# Patient Record
Sex: Male | Born: 1955 | Race: White | Hispanic: No | Marital: Married | Smoking: Never smoker
Health system: Southern US, Community
[De-identification: ages and names within clinical notes are randomized; demographics above are authoritative.]

## PROBLEM LIST (undated history)

## (undated) DIAGNOSIS — R7689 Other specified abnormal immunological findings in serum: Secondary | ICD-10-CM

## (undated) DIAGNOSIS — E041 Nontoxic single thyroid nodule: Secondary | ICD-10-CM

## (undated) DIAGNOSIS — E785 Hyperlipidemia, unspecified: Secondary | ICD-10-CM

## (undated) DIAGNOSIS — I209 Angina pectoris, unspecified: Secondary | ICD-10-CM

## (undated) DIAGNOSIS — R7303 Prediabetes: Secondary | ICD-10-CM

## (undated) DIAGNOSIS — R911 Solitary pulmonary nodule: Secondary | ICD-10-CM

## (undated) DIAGNOSIS — M1990 Unspecified osteoarthritis, unspecified site: Secondary | ICD-10-CM

## (undated) DIAGNOSIS — N400 Enlarged prostate without lower urinary tract symptoms: Secondary | ICD-10-CM

## (undated) DIAGNOSIS — I10 Essential (primary) hypertension: Secondary | ICD-10-CM

## (undated) DIAGNOSIS — K7689 Other specified diseases of liver: Secondary | ICD-10-CM

## (undated) DIAGNOSIS — D352 Benign neoplasm of pituitary gland: Secondary | ICD-10-CM

## (undated) DIAGNOSIS — Z7902 Long term (current) use of antithrombotics/antiplatelets: Secondary | ICD-10-CM

## (undated) DIAGNOSIS — M75101 Unspecified rotator cuff tear or rupture of right shoulder, not specified as traumatic: Secondary | ICD-10-CM

## (undated) DIAGNOSIS — R768 Other specified abnormal immunological findings in serum: Secondary | ICD-10-CM

## (undated) DIAGNOSIS — T7840XA Allergy, unspecified, initial encounter: Secondary | ICD-10-CM

## (undated) HISTORY — DX: Hyperlipidemia, unspecified: E78.5

## (undated) HISTORY — PX: TONSILLECTOMY: SUR1361

## (undated) HISTORY — DX: Essential (primary) hypertension: I10

## (undated) HISTORY — DX: Unspecified osteoarthritis, unspecified site: M19.90

## (undated) HISTORY — PX: CYSTOSCOPY WITH INSERTION OF UROLIFT: SHX6678

## (undated) HISTORY — PX: CATARACT EXTRACTION: SUR2

## (undated) HISTORY — PX: PITUITARY EXCISION: SHX745

## (undated) HISTORY — DX: Allergy, unspecified, initial encounter: T78.40XA

## (undated) HISTORY — PX: EYE SURGERY: SHX253

---

## 2006-06-17 HISTORY — PX: KNEE ARTHROSCOPY: SUR90

## 2013-05-12 LAB — HM COLONOSCOPY

## 2018-07-22 LAB — PSA: PSA: 1.1

## 2018-07-22 LAB — CBC AND DIFFERENTIAL
HCT: 43 (ref 41–53)
Hemoglobin: 14.3 (ref 13.5–17.5)
Neutrophils Absolute: 5
Platelets: 275 (ref 150–399)
WBC: 8.6

## 2018-07-22 LAB — BASIC METABOLIC PANEL
BUN: 16 (ref 4–21)
Creatinine: 0.9 (ref 0.6–1.3)
Glucose: 84
Potassium: 3.9 (ref 3.4–5.3)

## 2018-07-22 LAB — HEPATIC FUNCTION PANEL
ALT: 18 (ref 10–40)
AST: 28 (ref 14–40)
Alkaline Phosphatase: 90 (ref 25–125)
Bilirubin, Total: 0.6

## 2018-07-22 LAB — LIPID PANEL
Cholesterol: 183 (ref 0–200)
HDL: 43 (ref 35–70)
LDL Cholesterol: 108

## 2019-02-24 ENCOUNTER — Encounter: Payer: Self-pay | Admitting: Physician Assistant

## 2019-02-24 ENCOUNTER — Other Ambulatory Visit: Payer: Self-pay

## 2019-02-24 ENCOUNTER — Ambulatory Visit (INDEPENDENT_AMBULATORY_CARE_PROVIDER_SITE_OTHER): Payer: BC Managed Care – PPO | Admitting: Physician Assistant

## 2019-02-24 VITALS — BP 153/89 | HR 83 | Temp 97.5°F | Resp 16 | Ht 67.0 in | Wt 169.0 lb

## 2019-02-24 DIAGNOSIS — N401 Enlarged prostate with lower urinary tract symptoms: Secondary | ICD-10-CM

## 2019-02-24 DIAGNOSIS — N50819 Testicular pain, unspecified: Secondary | ICD-10-CM | POA: Diagnosis not present

## 2019-02-24 DIAGNOSIS — E785 Hyperlipidemia, unspecified: Secondary | ICD-10-CM | POA: Diagnosis not present

## 2019-02-24 DIAGNOSIS — Z800 Family history of malignant neoplasm of digestive organs: Secondary | ICD-10-CM

## 2019-02-24 DIAGNOSIS — Z1211 Encounter for screening for malignant neoplasm of colon: Secondary | ICD-10-CM

## 2019-02-24 DIAGNOSIS — Z23 Encounter for immunization: Secondary | ICD-10-CM | POA: Diagnosis not present

## 2019-02-24 DIAGNOSIS — M1990 Unspecified osteoarthritis, unspecified site: Secondary | ICD-10-CM

## 2019-02-24 DIAGNOSIS — R3911 Hesitancy of micturition: Secondary | ICD-10-CM

## 2019-02-24 DIAGNOSIS — I10 Essential (primary) hypertension: Secondary | ICD-10-CM

## 2019-02-24 DIAGNOSIS — R3912 Poor urinary stream: Secondary | ICD-10-CM

## 2019-02-24 LAB — POCT URINALYSIS DIPSTICK
Bilirubin, UA: NEGATIVE
Blood, UA: NEGATIVE
Glucose, UA: NEGATIVE
Ketones, UA: NEGATIVE
Leukocytes, UA: NEGATIVE
Nitrite, UA: NEGATIVE
Protein, UA: POSITIVE — AB
Spec Grav, UA: 1.02 (ref 1.010–1.025)
Urobilinogen, UA: 0.2 E.U./dL
pH, UA: 6.5 (ref 5.0–8.0)

## 2019-02-24 MED ORDER — NAPROXEN 500 MG PO TABS: 500.0000 mg | ORAL_TABLET | Freq: Every day | ORAL | 1 refills | Status: DC | PRN

## 2019-02-24 MED ORDER — TAMSULOSIN HCL 0.4 MG PO CAPS: 0.4000 mg | ORAL_CAPSULE | Freq: Every day | ORAL | 1 refills | Status: DC

## 2019-02-24 NOTE — Progress Notes (Signed)
Patient: Brett Wilson Male    DOB: 02-Mar-1956   63 y.o.   MRN: [id number] Visit Date: 02/25/2019  Today's Provider: Trinna Post, [name]   Chief Complaint  Patient presents with  . Establish Care   Subjective:     HPI   Patient presents today to establish care. 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 He has one child aged 33 and no grand children.   HTN: He is currently being treated for HTN with losartan 100 mg daily and HCTZ 12.5 mg daily. He denies chest pain, SOB. He reports his blood pressure runs high in the office but at home when he checks it was most recently 136/75.  BPH: He is being treated for BPH with flomax 0.4 mg daily. His symptoms included weak urinary stream.   HLD: Currently taking simvastatin 40 mg QD.   Testicular Pain: He has a history of intermittent testicular pain over the course of several years. At one point he underwent an ultrasound which revealed a hydrocele but was otherwise normal. He reports he will sometimes have a dull ache and intermittent twinges of pain. He has been treated at times with bactrim DS BID x 10 days, most recently prescribed by his PCP in [place] several weeks ago. He is uncertain if this helped.   Arthritis: He well occasionally take naproxen for aches and pains.   Allergies  Allergen Reactions  . Penicillins Hives     Current Outpatient Medications:  .  hydrochlorothiazide (MICROZIDE) 12.5 MG capsule, Take 12.5 mg by mouth daily. , Disp: , Rfl:  .  losartan (COZAAR) 100 MG tablet, Take 100 mg by mouth daily. , Disp: , Rfl:  .  Multiple Vitamin (MULTIVITAMIN PO), Take by mouth daily., Disp: , Rfl:  .  Multiple Vitamins-Minerals (ZINC PO), Take by mouth daily., Disp: , Rfl:  .  simvastatin (ZOCOR) 40 MG tablet, Take 40 mg by mouth daily. , Disp: , Rfl:  .  tamsulosin (FLOMAX) 0.4 MG CAPS capsule, Take 1 capsule (0.4 mg total) by mouth daily., Disp: 90  capsule, Rfl: 1 .  naproxen (NAPROSYN) 500 MG tablet, Take 1 tablet (500 mg total) by mouth daily as needed., Disp: 60 tablet, Rfl: 1  Review of Systems  HENT: Positive for tinnitus.   Genitourinary: Positive for difficulty urinating and testicular pain.  Musculoskeletal: Positive for back pain and neck pain.  All other systems reviewed and are negative.   Social History   Tobacco Use  . Smoking status: Never Smoker  . Smokeless tobacco: Never Used  Substance Use Topics  . Alcohol use: Yes    Frequency: Never      Objective:   BP (!) 153/89 (BP Location: Right Arm, Patient Position: Sitting, Cuff Size: Large)   Pulse 83   Temp (!) 97.5 F (36.4 C) (Other (Comment))   Resp 16   Ht 5\' 7"  (1.702 m)   Wt 169 lb (76.7 kg)   SpO2 95%   BMI 26.47 kg/m  Vitals:   02/24/19 0914  BP: (!) 153/89  Pulse: 83  Resp: 16  Temp: (!) 97.5 F (36.4 C)  TempSrc: Other (Comment)  SpO2: 95%  Weight: 169 lb (76.7 kg)  Height: 5\' 7"  (1.702 m)  Body mass index is 26.47 kg/m.   Physical Exam Constitutional:      Appearance: Normal appearance.  Cardiovascular:     Rate and Rhythm:  Normal rate and regular rhythm.     Heart sounds: Normal heart sounds.  Pulmonary:     Effort: Pulmonary effort is normal.     Breath sounds: Normal breath sounds.  Abdominal:     General: Bowel sounds are normal.     Palpations: Abdomen is soft.  Skin:    General: Skin is warm and dry.  Neurological:     Mental Status: He is alert and oriented to person, place, and time. Mental status is at baseline.  Psychiatric:        Behavior: Behavior normal.        Thought Content: Thought content normal.      Results for orders placed or performed in visit on 02/24/19  POCT Urinalysis Dipstick  Result Value Ref Range   Color, UA     Clarity, UA     Glucose, UA Negative Negative   Bilirubin, UA Negative    Ketones, UA Negative    Spec Grav, UA 1.020 1.010 - 1.025   Blood, UA Negative    pH, UA 6.5  5.0 - 8.0   Protein, UA Positive (A) Negative   Urobilinogen, UA 0.2 0.2 or 1.0 E.U./dL   Nitrite, UA Negative    Leukocytes, UA Negative Negative   Appearance     Odor         Assessment & Plan    1. Hyperlipidemia, unspecified hyperlipidemia type  Continue statin. Well controlled based on labs from previous PCP.   2. Essential hypertension  Reports his readings are normal at home. Continue current medications.  3. Benign prostatic hyperplasia with weak urinary stream  Continue flomax.   - tamsulosin (FLOMAX) 0.4 MG CAPS capsule; Take 1 capsule (0.4 mg total) by mouth daily.  Dispense: 90 capsule; Refill: 1  4. Arthritis  - naproxen (NAPROSYN) 500 MG tablet; Take 1 tablet (500 mg total) by mouth daily as needed.  Dispense: 60 tablet; Refill: 1  5. Testicular pain  - Ambulatory referral to Urology - POCT Urinalysis Dipstick - CULTURE, URINE COMPREHENSIVE  6. Family history of colon cancer  - Ambulatory referral to Gastroenterology  7. Need for influenza vaccination  - Flu Vaccine QUAD 36+ mos IM  8. Benign prostatic hyperplasia with urinary hesitancy  - Ambulatory referral to Urology  The entirety of the information documented in the History of Present Illness, Review of Systems and Physical Exam were personally obtained by me. Portions of this information were initially documented by April M. Sabra Heck, CMA and reviewed by me for thoroughness and accuracy.   F/u 6 months for CPE     Trinna Post, [name]  [facility]

## 2019-02-24 NOTE — Patient Instructions (Signed)
Health Maintenance After Age 63 After age 63, you are at a higher risk for certain long-term diseases and infections as well as injuries from falls. Falls are a major cause of broken bones and head injuries in people who are older than age 63. Getting regular preventive care can help to keep you healthy and well. Preventive care includes getting regular testing and making lifestyle changes as recommended by your health care provider. Talk with your health care provider about:  Which screenings and tests you should have. A screening is a test that checks for a disease when you have no symptoms.  A diet and exercise plan that is right for you. What should I know about screenings and tests to prevent falls? Screening and testing are the best ways to find a health problem early. Early diagnosis and treatment give you the best chance of managing medical conditions that are common after age 63. Certain conditions and lifestyle choices may make you more likely to have a fall. Your health care provider may recommend:  Regular vision checks. Poor vision and conditions such as cataracts can make you more likely to have a fall. If you wear glasses, make sure to get your prescription updated if your vision changes.  Medicine review. Work with your health care provider to regularly review all of the medicines you are taking, including over-the-counter medicines. Ask your health care provider about any side effects that may make you more likely to have a fall. Tell your health care provider if any medicines that you take make you feel dizzy or sleepy.  Osteoporosis screening. Osteoporosis is a condition that causes the bones to get weaker. This can make the bones weak and cause them to break more easily.  Blood pressure screening. Blood pressure changes and medicines to control blood pressure can make you feel dizzy.  Strength and balance checks. Your health care provider may recommend certain tests to check your  strength and balance while standing, walking, or changing positions.  Foot health exam. Foot pain and numbness, as well as not wearing proper footwear, can make you more likely to have a fall.  Depression screening. You may be more likely to have a fall if you have a fear of falling, feel emotionally low, or feel unable to do activities that you used to do.  Alcohol use screening. Using too much alcohol can affect your balance and may make you more likely to have a fall. What actions can I take to lower my risk of falls? General instructions  Talk with your health care provider about your risks for falling. Tell your health care provider if: ? You fall. Be sure to tell your health care provider about all falls, even ones that seem minor. ? You feel dizzy, sleepy, or off-balance.  Take over-the-counter and prescription medicines only as told by your health care provider. These include any supplements.  Eat a healthy diet and maintain a healthy weight. A healthy diet includes low-fat dairy products, low-fat (lean) meats, and fiber from whole grains, beans, and lots of fruits and vegetables. Home safety  Remove any tripping hazards, such as rugs, cords, and clutter.  Install safety equipment such as grab bars in bathrooms and safety rails on stairs.  Keep rooms and walkways well-lit. Activity   Follow a regular exercise program to stay fit. This will help you maintain your balance. Ask your health care provider what types of exercise are appropriate for you.  If you need a cane or   walker, use it as recommended by your health care provider.  Wear supportive shoes that have nonskid soles. Lifestyle  Do not drink alcohol if your health care provider tells you not to drink.  If you drink alcohol, limit how much you have: ? 0-1 drink a day for women. ? 0-2 drinks a day for men.  Be aware of how much alcohol is in your drink. In the [place] one drink equals one typical bottle of beer (12  oz), one-half glass of wine (5 oz), or one shot of hard liquor (1 oz).  Do not use any products that contain nicotine or tobacco, such as cigarettes and e-cigarettes. If you need help quitting, ask your health care provider. Summary  Having a healthy lifestyle and getting preventive care can help to protect your health and wellness after age 63.  Screening and testing are the best way to find a health problem early and help you avoid having a fall. Early diagnosis and treatment give you the best chance for managing medical conditions that are more common for people who are older than age 63.  Falls are a major cause of broken bones and head injuries in people who are older than age 63. Take precautions to prevent a fall at home.  Work with your health care provider to learn what changes you can make to improve your health and wellness and to prevent falls. This information is not intended to replace advice given to you by your health care provider. Make sure you discuss any questions you have with your health care provider. Document Released: 04/16/2017 Document Revised: 09/24/2018 Document Reviewed: 04/16/2017 Elsevier Patient Education  2020 Elsevier Inc.  

## 2019-02-25 ENCOUNTER — Encounter: Payer: Self-pay | Admitting: Physician Assistant

## 2019-02-25 DIAGNOSIS — N50819 Testicular pain, unspecified: Secondary | ICD-10-CM | POA: Insufficient documentation

## 2019-02-25 DIAGNOSIS — E785 Hyperlipidemia, unspecified: Secondary | ICD-10-CM | POA: Insufficient documentation

## 2019-02-25 DIAGNOSIS — I10 Essential (primary) hypertension: Secondary | ICD-10-CM | POA: Insufficient documentation

## 2019-02-25 DIAGNOSIS — N401 Enlarged prostate with lower urinary tract symptoms: Secondary | ICD-10-CM | POA: Insufficient documentation

## 2019-02-25 DIAGNOSIS — R3912 Poor urinary stream: Secondary | ICD-10-CM | POA: Insufficient documentation

## 2019-02-25 DIAGNOSIS — M1990 Unspecified osteoarthritis, unspecified site: Secondary | ICD-10-CM | POA: Insufficient documentation

## 2019-02-27 LAB — CULTURE, URINE COMPREHENSIVE

## 2019-03-01 ENCOUNTER — Encounter: Payer: Self-pay | Admitting: Physician Assistant

## 2019-03-24 ENCOUNTER — Ambulatory Visit: Payer: BC Managed Care – PPO | Admitting: Urology

## 2019-03-24 ENCOUNTER — Encounter: Payer: Self-pay | Admitting: Urology

## 2019-03-24 ENCOUNTER — Other Ambulatory Visit: Payer: Self-pay

## 2019-03-24 VITALS — BP 183/82 | HR 99 | Ht 67.0 in | Wt 185.0 lb

## 2019-03-24 DIAGNOSIS — N401 Enlarged prostate with lower urinary tract symptoms: Secondary | ICD-10-CM | POA: Diagnosis not present

## 2019-03-24 DIAGNOSIS — N50819 Testicular pain, unspecified: Secondary | ICD-10-CM

## 2019-03-24 DIAGNOSIS — R339 Retention of urine, unspecified: Secondary | ICD-10-CM

## 2019-03-24 DIAGNOSIS — N138 Other obstructive and reflux uropathy: Secondary | ICD-10-CM | POA: Diagnosis not present

## 2019-03-24 DIAGNOSIS — G8929 Other chronic pain: Secondary | ICD-10-CM

## 2019-03-24 LAB — MICROSCOPIC EXAMINATION
Bacteria, UA: NONE SEEN
Epithelial Cells (non renal): NONE SEEN /hpf (ref 0–10)
RBC, Urine: NONE SEEN /hpf (ref 0–2)

## 2019-03-24 LAB — URINALYSIS, COMPLETE
Bilirubin, UA: NEGATIVE
Glucose, UA: NEGATIVE
Ketones, UA: NEGATIVE
Leukocytes,UA: NEGATIVE
Nitrite, UA: NEGATIVE
Protein,UA: NEGATIVE
RBC, UA: NEGATIVE
Specific Gravity, UA: >1.03 — ABNORMAL HIGH (ref 1.005–1.030)
Urobilinogen, Ur: 0.2 mg/dL (ref 0.2–1.0)
pH, UA: 6 (ref 5.0–7.5)

## 2019-03-24 LAB — BLADDER SCAN AMB NON-IMAGING

## 2019-03-24 MED ORDER — FINASTERIDE 5 MG PO TABS: 5.0000 mg | ORAL_TABLET | Freq: Every day | ORAL | 11 refills | Status: DC

## 2019-03-24 NOTE — Progress Notes (Signed)
03/24/2019 10:48 AM   Brett Wilson 1955/08/11 [id number]  Referring provider: Trinna Post, [name] [address] [place],  [place] [id number]  Chief Complaint  Patient presents with  . Testicle Pain    HPI: 63 year old male who presents today to establish care for history of right testicular pain as well as urinary symptoms.  He has recently moved from [place] to [place] and is is seeking to establish care with a urologist.  He has a personal history of right testicular pain and possibly recurrent right epididymitis.  He reports this started about 3 years ago at which time he had an episode of severe right testicular pain and was treated with Bactrim.  After starting the antibiotics, symptoms quickly resolved.  He does subsequent episode the following year with a similar presentation and resolution.  He had another episode a few months ago prior to moving to [place] was also given antibiotics.  This time however, his pain failed to resolve immediately as on previous occasions and required about a month until all pain and swelling had resolved.  He reports that he had no associated urinary symptoms from his baseline with these episodes.  They are not associated with trauma.  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  He noticed a "lump" on his penis a few years ago.  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  He never underwent treatment.  He has very minimal penile curvature and no difficulties with penetration.  No ED.  In addition to the above, he reports that over the past several years, he has noticed a decline in his overall ability to empty his bladder, stream, with increased frequency and urgency.  IPSS as below.  He was started on Flomax a few years ago which helped somewhat but his symptoms seem to be worsening.  No dysuria or gross hematuria.  No personal history of UTIs.  Most  recent PSA 1.10 on 07/22/18.  PVR 114.  IPSS    Row Name 03/24/19 1500         International Prostate Symptom Score   How often have you had the sensation of not emptying your bladder?  More than half the time     How often have you had to urinate less than every two hours?  Less than half the time     How often have you found you stopped and started again several times when you urinated?  About half the time     How often have you found it difficult to postpone urination?  Not at All     How often have you had a weak urinary stream?  About half the time     How often have you had to strain to start urination?  Less than half the time     How many times did you typically get up at night to urinate?  2 Times     Total IPSS Score  16       Quality of Life due to urinary symptoms   If you were to spend the rest of your life with your urinary condition just the way it is now how would you feel about that?  Mostly Satisfied        Score:  1-7 Mild 8-19 Moderate 20-35 Severe    PMH: Past Medical History:  Diagnosis Date  . Allergy   . Hyperlipidemia   .  Hypertension     Surgical History: Past Surgical History:  Procedure Laterality Date  . CATARACT EXTRACTION    . PITUITARY EXCISION    . TONSILLECTOMY      Home Medications:  Allergies as of 03/24/2019      Reactions   Penicillins Hives      Medication List       Accurate as of March 24, 2019 11:59 PM. If you have any questions, ask your nurse or doctor.        finasteride 5 MG tablet Commonly known as: PROSCAR Take 1 tablet (5 mg total) by mouth daily. Started by: Hollice Espy, MD   hydrochlorothiazide 12.5 MG capsule Commonly known as: MICROZIDE Take 12.5 mg by mouth daily.   losartan 100 MG tablet Commonly known as: COZAAR Take 100 mg by mouth daily.   MULTIVITAMIN PO Take by mouth daily.   naproxen 500 MG tablet Commonly known as: Naprosyn Take 1 tablet (500 mg total) by mouth daily as  needed.   simvastatin 40 MG tablet Commonly known as: ZOCOR Take 40 mg by mouth daily.   tamsulosin 0.4 MG Caps capsule Commonly known as: FLOMAX Take 1 capsule (0.4 mg total) by mouth daily.   ZINC PO Take by mouth daily.       Allergies:  Allergies  Allergen Reactions  . Penicillins Hives    Family History: Family History  Problem Relation Age of Onset  . Hypertension Mother   . Colon cancer Father     Social History:  reports that he has never smoked. He has never used smokeless tobacco. He reports current alcohol use. He reports that he does not use drugs.  ROS: UROLOGY Frequent Urination?: No Hard to postpone urination?: No Burning/pain with urination?: No Get up at night to urinate?: Yes Leakage of urine?: No Urine stream starts and stops?: No Trouble starting stream?: Yes Do you have to strain to urinate?: No Blood in urine?: No Urinary tract infection?: No Sexually transmitted disease?: No Injury to kidneys or bladder?: No Painful intercourse?: No Weak stream?: Yes Erection problems?: No Penile pain?: No  Gastrointestinal Nausea?: No Vomiting?: No Indigestion/heartburn?: No Diarrhea?: No Constipation?: No  Constitutional Fever: No Night sweats?: No Weight loss?: No Fatigue?: No  Skin Skin rash/lesions?: No Itching?: No  Eyes Blurred vision?: No Double vision?: No  Ears/Nose/Throat Sore throat?: No Sinus problems?: No  Hematologic/Lymphatic Swollen glands?: No Easy bruising?: No  Cardiovascular Leg swelling?: No Chest pain?: No  Respiratory Cough?: No Shortness of breath?: No  Endocrine Excessive thirst?: No  Musculoskeletal Back pain?: No Joint pain?: No  Neurological Headaches?: No Dizziness?: No  Psychologic Depression?: No Anxiety?: No  Physical Exam: BP (!) 183/82   Pulse 99   Ht 5\' 7"  (1.702 m)   Wt 185 lb (83.9 kg)   BMI 28.98 kg/m   Constitutional:  Alert and oriented, No acute distress.  HEENT: [place] AT, moist mucus membranes.  Trachea midline, no masses. Cardiovascular: No clubbing, cyanosis, or edema. Respiratory: Normal respiratory effort, no increased work of breathing. GI: Abdomen is soft, nontender, nondistended, no abdominal masses GU: Normal phallus with orthotopic meatus.  Bilateral descended testicles, nontender, no masses or nodules.  Normal spermatic cords bilaterally. Rectal: Normal sphincter tone.  Enlarged prostate, 50 cc with rubbery lateral lobes, no nodules or tenderness. Skin: No rashes, bruises or suspicious lesions. Neurologic: Grossly intact, no focal deficits, moving all 4 extremities. Psychiatric: Normal mood and affect.  Laboratory Data: Lab Results  Component Value  Date   WBC 8.6 07/22/2018   HGB 14.3 07/22/2018   HCT 43 07/22/2018   PLT 275 07/22/2018    Lab Results  Component Value Date   CREATININE 0.9 07/22/2018    Lab Results  Component Value Date   PSA 1.10 07/22/2018    Urinalysis Pertinent Imaging: Results for orders placed or performed in visit on 03/24/19  Microscopic Examination   URINE  Result Value Ref Range   WBC, UA 0-5 0 - 5 /hpf   RBC None seen 0 - 2 /hpf   Epithelial Cells (non renal) None seen 0 - 10 /hpf   Bacteria, UA None seen None seen/Few  Urinalysis, Complete  Result Value Ref Range   Specific Gravity, UA >1.030 (H) 1.005 - 1.030   pH, UA 6.0 5.0 - 7.5   Color, UA Yellow Yellow   Appearance Ur Clear Clear   Leukocytes,UA Negative Negative   Protein,UA Negative Negative/Trace   Glucose, UA Negative Negative   Ketones, UA Negative Negative   RBC, UA Negative Negative   Bilirubin, UA Negative Negative   Urobilinogen, Ur 0.2 0.2 - 1.0 mg/dL   Nitrite, UA Negative Negative   Microscopic Examination See below:   Bladder Scan (Post Void Residual) in office  Result Value Ref Range   Scan Result 114ML     Assessment & Plan:    1. BPH with urinary obstruction Personal history of mixed urinary  symptoms, primarily obstructive  We do lengthy discussion today about maximizing pharmacotherapy versus consideration of procedural/surgical intervention to relieve his outlet obstruction is extruding factor to his symptoms.  In addition to the above, he does have a mildly elevated PVR which may or may not be contributing to his episodes of presumed epididymitis in the past.  Patient strongly prefers pharmacotherapy to intervention.  If he did consider intervention, he would be more interested in UroLift type procedure rather than a TURP or holep.  He was given information about this today which was discussed.  We discussed risk and benefits of each of the options.  We discussed the role of finasteride today and reduction in size of the prostate.  We discussed possible side effects including black box warning as well as libido.  He is interested in trying medication.  He understands that this medication does not work immediately.  We will follow-up in 6 months to reassess his symptoms.  Continue Flomax. - Urinalysis, Complete - Bladder Scan (Post Void Residual) in office  2. Incomplete bladder emptying As above  3. 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  Physical exam today is unremarkable.  I recommended supportive care primarily unless her significant pain or swelling.   Return in about 6 months (around 09/22/2019) for MD follow up IPSS / PVR.  Hollice Espy, MD  [facility] [address], [place], [place] [address] [phone]

## 2019-03-25 ENCOUNTER — Encounter: Payer: Self-pay | Admitting: Urology

## 2019-03-31 ENCOUNTER — Ambulatory Visit: Payer: Self-pay | Admitting: Urology

## 2019-04-05 ENCOUNTER — Telehealth: Payer: Self-pay | Admitting: Physician Assistant

## 2019-04-05 MED ORDER — HYDROCHLOROTHIAZIDE 12.5 MG PO CAPS: 12.5000 mg | ORAL_CAPSULE | Freq: Every day | ORAL | 0 refills | Status: DC

## 2019-04-05 NOTE — Telephone Encounter (Signed)
Pt needs a refill Hydrochlorothiazide   [facility]

## 2019-04-09 ENCOUNTER — Other Ambulatory Visit
Admission: RE | Admit: 2019-04-09 | Discharge: 2019-04-09 | Disposition: A | Discharge status: Home / Self Care | Payer: BC Managed Care – PPO | Source: Ambulatory Visit | Attending: Gastroenterology | Admitting: Gastroenterology

## 2019-04-09 DIAGNOSIS — Z01812 Encounter for preprocedural laboratory examination: Secondary | ICD-10-CM | POA: Insufficient documentation

## 2019-04-09 DIAGNOSIS — Z20828 Contact with and (suspected) exposure to other viral communicable diseases: Secondary | ICD-10-CM | POA: Insufficient documentation

## 2019-04-09 LAB — SARS CORONAVIRUS 2 (TAT 6-24 HRS): SARS Coronavirus 2: NEGATIVE

## 2019-04-13 ENCOUNTER — Ambulatory Visit
Admission: RE | Admit: 2019-04-13 | Discharge: 2019-04-13 | Disposition: A | Discharge status: Home / Self Care | Payer: BC Managed Care – PPO | Attending: Gastroenterology | Admitting: Gastroenterology

## 2019-04-13 ENCOUNTER — Other Ambulatory Visit: Payer: Self-pay

## 2019-04-13 ENCOUNTER — Ambulatory Visit: Payer: BC Managed Care – PPO | Admitting: Anesthesiology

## 2019-04-13 ENCOUNTER — Encounter
Admission: RE | Disposition: A | Discharge status: Home / Self Care | Payer: Self-pay | Source: Home / Self Care | Attending: Gastroenterology

## 2019-04-13 DIAGNOSIS — Z1211 Encounter for screening for malignant neoplasm of colon: Secondary | ICD-10-CM | POA: Diagnosis not present

## 2019-04-13 DIAGNOSIS — K635 Polyp of colon: Secondary | ICD-10-CM | POA: Insufficient documentation

## 2019-04-13 DIAGNOSIS — E785 Hyperlipidemia, unspecified: Secondary | ICD-10-CM | POA: Insufficient documentation

## 2019-04-13 DIAGNOSIS — Z8371 Family history of colonic polyps: Secondary | ICD-10-CM | POA: Diagnosis not present

## 2019-04-13 DIAGNOSIS — Z800 Family history of malignant neoplasm of digestive organs: Secondary | ICD-10-CM | POA: Diagnosis not present

## 2019-04-13 DIAGNOSIS — Z79899 Other long term (current) drug therapy: Secondary | ICD-10-CM | POA: Insufficient documentation

## 2019-04-13 DIAGNOSIS — I10 Essential (primary) hypertension: Secondary | ICD-10-CM | POA: Diagnosis not present

## 2019-04-13 DIAGNOSIS — K641 Second degree hemorrhoids: Secondary | ICD-10-CM | POA: Diagnosis not present

## 2019-04-13 HISTORY — PX: COLONOSCOPY WITH PROPOFOL: SHX5780

## 2019-04-13 SURGERY — COLONOSCOPY WITH PROPOFOL
Anesthesia: General

## 2019-04-13 MED ORDER — SODIUM CHLORIDE 0.9 % IV SOLN
INTRAVENOUS | Status: DC
  Administered 2019-04-13: 09:00:00 via INTRAVENOUS

## 2019-04-13 MED ORDER — LIDOCAINE HCL (CARDIAC) PF 100 MG/5ML IV SOSY
PREFILLED_SYRINGE | INTRAVENOUS | Status: DC | PRN
  Administered 2019-04-13: 100 mg via INTRAVENOUS

## 2019-04-13 MED ORDER — PROPOFOL 10 MG/ML IV BOLUS
INTRAVENOUS | Status: DC | PRN
  Administered 2019-04-13: 30 mg via INTRAVENOUS
  Administered 2019-04-13: 70 mg via INTRAVENOUS
  Administered 2019-04-13: 30 mg via INTRAVENOUS
  Administered 2019-04-13: 50 mg via INTRAVENOUS

## 2019-04-13 MED ORDER — PROPOFOL 500 MG/50ML IV EMUL
INTRAVENOUS | Status: DC | PRN
  Administered 2019-04-13: 140 ug/kg/min via INTRAVENOUS

## 2019-04-13 NOTE — Transfer of Care (Signed)
Immediate Anesthesia Transfer of Care Note  Patient: Brett Wilson  Procedure(s) Performed: COLONOSCOPY WITH PROPOFOL (N/A )  Patient Location: PACU  Anesthesia Type:General  Level of Consciousness: sedated  Airway & Oxygen Therapy: Patient Spontanous Breathing and Patient connected to nasal cannula oxygen  Post-op Assessment: Report given to RN and Post -op Vital signs reviewed and stable  Post vital signs: Reviewed and stable  Last Vitals:  Vitals Value Taken Time  BP 111/80 04/13/19 0920  Temp 36.5 C 04/13/19 0920  Pulse 74 04/13/19 0920  Resp 18 04/13/19 0920  SpO2 97 % 04/13/19 0920    Last Pain:  Vitals:   04/13/19 0920  TempSrc:   PainSc: 0-No pain         Complications: No apparent anesthesia complications

## 2019-04-13 NOTE — Op Note (Signed)
[facility] Gastroenterology Patient Name: Brett Wilson Procedure Date: 04/13/2019 8:56 AM MRN: [id number] Account #: [id number] Date of Birth: Nov 23, 1955 Admit Type: Outpatient Age: 63 Room: [facility] ENDO ROOM 4 Gender: Male Note Status: Finalized Procedure:            Colonoscopy Indications:          Family history of colon cancer in a first-degree                        relative Providers:            Lucilla Lame MD, MD Referring MD:         Wendee Beavers. Terrilee Croak (Referring MD) Medicines:            Propofol per Anesthesia Complications:        No immediate complications. Procedure:            Pre-Anesthesia Assessment:                       - Prior to the procedure, a History and Physical was                        performed, and patient medications and allergies were                        reviewed. The patient's tolerance of previous                        anesthesia was also reviewed. The risks and benefits of                        the procedure and the sedation options and risks were                        discussed with the patient. All questions were                        answered, and informed consent was obtained. Prior                        Anticoagulants: The patient has taken no previous                        anticoagulant or antiplatelet agents. ASA Grade                        Assessment: II - A patient with mild systemic disease.                        After reviewing the risks and benefits, the patient was                        deemed in satisfactory condition to undergo the                        procedure.                       After obtaining informed consent, the colonoscope was  passed under direct vision. Throughout the procedure,                        the patient's blood pressure, pulse, and oxygen                        saturations were monitored continuously. The                        Colonoscope was introduced  through the anus and                        advanced to the the cecum, identified by appendiceal                        orifice and ileocecal valve. The colonoscopy was                        performed without difficulty. The patient tolerated the                        procedure well. The quality of the bowel preparation                        was excellent. Findings:      The perianal and digital rectal examinations were normal.      Two sessile polyps were found in the descending colon. The polyps were 3       to 4 mm in size. These polyps were removed with a cold biopsy forceps.       Resection and retrieval were complete.      Non-bleeding internal hemorrhoids were found during retroflexion. The       hemorrhoids were Grade II (internal hemorrhoids that prolapse but reduce       spontaneously). Impression:           - Two 3 to 4 mm polyps in the descending colon, removed                        with a cold biopsy forceps. Resected and retrieved.                       - Non-bleeding internal hemorrhoids. Recommendation:       - Discharge patient to home.                       - Resume previous diet.                       - Continue present medications.                       - Await pathology results.                       - Repeat colonoscopy in 5 years for surveillance. Procedure Code(s):    --- Professional ---                       [phone], Colonoscopy, flexible; with biopsy, single or  multiple Diagnosis Code(s):    --- Professional ---                       Z80.0, Family history of malignant neoplasm of                        digestive organs                       K63.5, Polyp of colon CPT copyright 2019 American Medical Association. All rights reserved. The codes documented in this report are preliminary and upon coder review may  be revised to meet current compliance requirements. Lucilla Lame MD, MD 04/13/2019 9:19:35 AM This report has been signed  electronically. Number of Addenda: 0 Note Initiated On: 04/13/2019 8:56 AM Scope Withdrawal Time: 0 hours 8 minutes 10 seconds  Total Procedure Duration: 0 hours 12 minutes 56 seconds  Estimated Blood Loss: Estimated blood loss: none.      [facility]

## 2019-04-13 NOTE — Anesthesia Post-op Follow-up Note (Signed)
Anesthesia QCDR form completed.        

## 2019-04-13 NOTE — H&P (Signed)
Brett Lame, MD [address] [place], [place] [address] Phone:[phone] Fax : [phone]  Primary Care Physician:  Brett Wilson Primary Gastroenterologist:  Dr. Allen Wilson  Pre-Procedure History & Physical: HPI:  Brett Wilson is a 63 y.o. male is here for an colonoscopy.   Past Medical History:  Diagnosis Date  . Allergy   . Hyperlipidemia   . Hypertension     Past Surgical History:  Procedure Laterality Date  . CATARACT EXTRACTION    . PITUITARY EXCISION    . TONSILLECTOMY      Prior to Admission medications   Medication Sig Start Date End Date Taking? Authorizing Provider  finasteride (PROSCAR) 5 MG tablet Take 1 tablet (5 mg total) by mouth daily. 03/24/19  Yes Hollice Espy, MD  hydrochlorothiazide (MICROZIDE) 12.5 MG capsule Take 1 capsule (12.5 mg total) by mouth daily. 04/05/19  Yes Carles Collet M, [name]  losartan (COZAAR) 100 MG tablet Take 100 mg by mouth daily.  12/31/18  Yes [provider]  Multiple Vitamin (MULTIVITAMIN PO) Take by mouth daily.   Yes [provider]  Multiple Vitamins-Minerals (ZINC PO) Take by mouth daily.   Yes [provider]  naproxen (NAPROSYN) 500 MG tablet Take 1 tablet (500 mg total) by mouth daily as needed. 02/24/19  Yes Carles Collet M, [name]  simvastatin (ZOCOR) 40 MG tablet Take 40 mg by mouth daily.  11/21/18  Yes [provider]  tamsulosin (FLOMAX) 0.4 MG CAPS capsule Take 1 capsule (0.4 mg total) by mouth daily. 02/24/19 08/23/19 Yes Trinna Post, [name]    Allergies as of 02/25/2019 - Review Complete 02/24/2019  Allergen Reaction Noted  . Penicillins Hives 02/24/2019    Family History  Problem Relation Age of Onset  . Hypertension Mother   . Colon cancer Father     Social History   Socioeconomic History  . Marital status: Married    Spouse name: Not on file  . Number of children: Not on file  . Years of education: Not on file  . Highest education level:  Not on file  Occupational History  . Not on file  Social Needs  . Financial resource strain: Not on file  . Food insecurity    Worry: Not on file    Inability: Not on file  . Transportation needs    Medical: Not on file    Non-medical: Not on file  Tobacco Use  . Smoking status: Never Smoker  . Smokeless tobacco: Never Used  Substance and Sexual Activity  . Alcohol use: Yes    Frequency: Never  . Drug use: Never  . Sexual activity: Not on file  Lifestyle  . Physical activity    Days per week: Not on file    Minutes per session: Not on file  . Stress: Not on file  Relationships  . Social Herbalist on phone: Not on file    Gets together: Not on file    Attends religious service: Not on file    Active member of club or organization: Not on file    Attends meetings of clubs or organizations: Not on file    Relationship status: Not on file  . Intimate partner violence    Fear of current or ex partner: Not on file    Emotionally abused: Not on file    Physically abused: Not on file    Forced sexual activity: Not on file  Other Topics Concern  .  Not on file  Social History Narrative  . Not on file    Review of Systems: See HPI, otherwise negative ROS  Physical Exam: BP (!) 161/103   Pulse 94   Temp (!) 97.5 F (36.4 C) (Tympanic)   Resp 16   Ht 5\' 7"  (1.702 m)   Wt 77.1 kg   SpO2 99%   BMI 26.63 kg/m  General:   Alert,  pleasant and cooperative in NAD Head:  Normocephalic and atraumatic. Neck:  Supple; no masses or thyromegaly. Lungs:  Clear throughout to auscultation.    Heart:  Regular rate and rhythm. Abdomen:  Soft, nontender and nondistended. Normal bowel sounds, without guarding, and without rebound.   Neurologic:  Alert and  oriented x4;  grossly normal neurologically.  Impression/Plan: Brett Wilson is here for an colonoscopy to be performed for family history of colon cancer  Risks, benefits, limitations, and alternatives regarding   colonoscopy have been reviewed with the patient.  Questions have been answered.  All parties agreeable.   Brett Lame, MD  04/13/2019, 8:57 AM

## 2019-04-13 NOTE — Anesthesia Preprocedure Evaluation (Signed)
Anesthesia Evaluation  Patient identified by MRN, date of birth, ID band Patient awake    Reviewed: Allergy & Precautions, NPO status , Patient's Chart, lab work & pertinent test results  Airway Mallampati: II  TM Distance: >3 FB     Dental   Pulmonary neg pulmonary ROS,    Pulmonary exam normal        Cardiovascular hypertension, Normal cardiovascular exam     Neuro/Psych negative neurological ROS  negative psych ROS   GI/Hepatic negative GI ROS, Neg liver ROS,   Endo/Other  negative endocrine ROS  Renal/GU negative Renal ROS  negative genitourinary   Musculoskeletal  (+) Arthritis , Osteoarthritis,    Abdominal Normal abdominal exam  (+)   Peds negative pediatric ROS (+)  Hematology negative hematology ROS (+)   Anesthesia Other Findings   Reproductive/Obstetrics                             Anesthesia Physical Anesthesia Plan  ASA: II  Anesthesia Plan: General   Post-op Pain Management:    Induction: Intravenous  PONV Risk Score and Plan: Propofol infusion  Airway Management Planned:   Additional Equipment:   Intra-op Plan:   Post-operative Plan:   Informed Consent: I have reviewed the patients History and Physical, chart, labs and discussed the procedure including the risks, benefits and alternatives for the proposed anesthesia with the patient or authorized representative who has indicated his/her understanding and acceptance.     Dental advisory given  Plan Discussed with: CRNA and Surgeon  Anesthesia Plan Comments:         Anesthesia Quick Evaluation

## 2019-04-13 NOTE — Anesthesia Postprocedure Evaluation (Signed)
Anesthesia Post Note  Patient: Brett Wilson  Procedure(s) Performed: COLONOSCOPY WITH PROPOFOL (N/A )  Patient location during evaluation: Endoscopy Anesthesia Type: General Level of consciousness: awake and alert and oriented Pain management: pain level controlled Vital Signs Assessment: post-procedure vital signs reviewed and stable Respiratory status: spontaneous breathing Cardiovascular status: blood pressure returned to baseline Anesthetic complications: no     Last Vitals:  Vitals:   04/13/19 0930 04/13/19 0939  BP: 116/90 120/80  Pulse: 79   Resp: 13   Temp:    SpO2: 96%     Last Pain:  Vitals:   04/13/19 0939  TempSrc:   PainSc: 0-No pain                 Teara Duerksen

## 2019-04-14 ENCOUNTER — Encounter: Payer: Self-pay | Admitting: Gastroenterology

## 2019-04-16 LAB — SURGICAL PATHOLOGY

## 2019-04-17 ENCOUNTER — Encounter: Payer: Self-pay | Admitting: Gastroenterology

## 2019-05-05 ENCOUNTER — Other Ambulatory Visit: Payer: Self-pay | Admitting: Physician Assistant

## 2019-05-05 MED ORDER — SIMVASTATIN 40 MG PO TABS: 40.0000 mg | ORAL_TABLET | Freq: Every day | ORAL | 1 refills | Status: DC

## 2019-05-05 NOTE — Telephone Encounter (Signed)
Requested medication (s) are due for refill today: yes  Requested medication (s) are on the active medication list: yes  Last refill:  Historic provider  Future visit scheduled: no  Notes to clinic:  Historic medication and provider    Requested Prescriptions  Pending Prescriptions Disp Refills   simvastatin (ZOCOR) 40 MG tablet 30 tablet     Sig: Take 1 tablet (40 mg total) by mouth daily.     Cardiovascular:  Antilipid - Statins Failed - 05/05/2019 11:06 AM      Failed - Triglycerides in normal range and within 360 days    No results found for: TRIG       Passed - Total Cholesterol in normal range and within 360 days    Cholesterol  Date Value Ref Range Status  07/22/2018 183 0 - 200 Final         Passed - LDL in normal range and within 360 days    LDL Cholesterol  Date Value Ref Range Status  07/22/2018 108  Final         Passed - HDL in normal range and within 360 days    HDL  Date Value Ref Range Status  07/22/2018 43 35 - 70 Final         Passed - Patient is not pregnant      Passed - Valid encounter within last 12 months    Recent Outpatient Visits          2 months ago Hyperlipidemia, unspecified hyperlipidemia type   [place], Wendee Beavers, [name]      Future Appointments            In 4 months Hollice Espy, MD [place]

## 2019-05-05 NOTE — Telephone Encounter (Signed)
Pt request refill   simvastatin (ZOCOR) 40 MG tablet  Brett Wilson has never written for the pt, this was from previous dr.  [name] will need new Rx  [facility] #[id number] Brett Wilson, [place] [phone] (Phone

## 2019-05-19 ENCOUNTER — Ambulatory Visit: Payer: BC Managed Care – PPO | Admitting: Physician Assistant

## 2019-06-01 ENCOUNTER — Other Ambulatory Visit: Payer: Self-pay

## 2019-06-01 ENCOUNTER — Ambulatory Visit (INDEPENDENT_AMBULATORY_CARE_PROVIDER_SITE_OTHER): Payer: BC Managed Care – PPO | Admitting: Physician Assistant

## 2019-06-01 DIAGNOSIS — Z23 Encounter for immunization: Secondary | ICD-10-CM

## 2019-06-01 NOTE — Progress Notes (Signed)
Patient sat for ten minutes and tolerated vaccine well.

## 2019-06-02 ENCOUNTER — Ambulatory Visit: Payer: BC Managed Care – PPO | Admitting: Physician Assistant

## 2019-06-02 DIAGNOSIS — Z23 Encounter for immunization: Secondary | ICD-10-CM | POA: Diagnosis not present

## 2019-06-24 ENCOUNTER — Other Ambulatory Visit: Payer: Self-pay

## 2019-06-24 ENCOUNTER — Ambulatory Visit: Payer: BC Managed Care – PPO | Admitting: Physician Assistant

## 2019-06-24 ENCOUNTER — Ambulatory Visit
Admission: RE | Admit: 2019-06-24 | Discharge: 2019-06-24 | Disposition: A | Discharge status: Home / Self Care | Payer: BC Managed Care – PPO | Source: Ambulatory Visit | Attending: Physician Assistant | Admitting: Physician Assistant

## 2019-06-24 ENCOUNTER — Encounter: Payer: Self-pay | Admitting: Physician Assistant

## 2019-06-24 ENCOUNTER — Ambulatory Visit
Admission: RE | Admit: 2019-06-24 | Discharge: 2019-06-24 | Disposition: A | Discharge status: Home / Self Care | Payer: BC Managed Care – PPO | Attending: Physician Assistant | Admitting: Physician Assistant

## 2019-06-24 VITALS — BP 148/92 | Temp 97.5°F | Wt 176.0 lb

## 2019-06-24 DIAGNOSIS — M542 Cervicalgia: Secondary | ICD-10-CM | POA: Diagnosis not present

## 2019-06-24 DIAGNOSIS — R079 Chest pain, unspecified: Secondary | ICD-10-CM | POA: Diagnosis not present

## 2019-06-24 DIAGNOSIS — I10 Essential (primary) hypertension: Secondary | ICD-10-CM

## 2019-06-24 DIAGNOSIS — J0110 Acute frontal sinusitis, unspecified: Secondary | ICD-10-CM | POA: Diagnosis not present

## 2019-06-24 MED ORDER — LOSARTAN POTASSIUM 100 MG PO TABS: 100.0000 mg | ORAL_TABLET | Freq: Every day | ORAL | 1 refills | Status: DC

## 2019-06-24 MED ORDER — DOXYCYCLINE HYCLATE 100 MG PO TABS: 100.0000 mg | ORAL_TABLET | Freq: Two times a day (BID) | ORAL | 0 refills | Status: AC

## 2019-06-24 MED ORDER — HYDROCHLOROTHIAZIDE 12.5 MG PO CAPS: 12.5000 mg | ORAL_CAPSULE | Freq: Every day | ORAL | 1 refills | Status: DC

## 2019-06-24 MED ORDER — CYCLOBENZAPRINE HCL 5 MG PO TABS: 5.0000 mg | ORAL_TABLET | Freq: Every day | ORAL | 1 refills | Status: DC

## 2019-06-24 NOTE — Patient Instructions (Signed)

## 2019-06-24 NOTE — Progress Notes (Signed)
Patient: Brett Wilson Male    DOB: Dec 14, 1955   64 y.o.   MRN: [id number] Visit Date: 06/24/2019  Today's Provider: Trinna Post, [name]   Chief Complaint  Patient presents with  . Sinus Problem   Subjective:     Sinus Problem This is a recurrent problem. The current episode started in the past 7 days. The problem is unchanged. There has been no fever. His pain is at a severity of 0/10. He is experiencing no pain. Associated symptoms include congestion and sinus pressure. Pertinent negatives include no chills, coughing, hoarse voice, sneezing or sore throat.   Patient presents today for discomfort in his upper right chest area for about 8 weeks. Patient states he believes it maybe a possible lump in the area. Right sided chest pain x 8 months, constantly sore. No SOB, N/V, heartburn, no referred pain. Drink alcohol. No heart issues or breathing issues in the past. Nobody in famiyl with breast cancer, no fevers chills or weight loss.   He had a right rib xray on 07/06/2018 with right seventh rib fracture. He had a RUQ 10/16/2018 10 mm septated right hepatic lobe cyst, normal kidneys, unremarkable gallbladder.   sinuplasty - 2 years ago improved sinus infections. Reports right sided sinus pain and pressure right now.   BP Readings from Last 3 Encounters:  06/24/19 (!) 148/92  04/13/19 120/80  03/24/19 (!) 183/82      Allergies  Allergen Reactions  . Penicillins Hives     Current Outpatient Medications:  .  finasteride (PROSCAR) 5 MG tablet, Take 1 tablet (5 mg total) by mouth daily., Disp: 30 tablet, Rfl: 11 .  hydrochlorothiazide (MICROZIDE) 12.5 MG capsule, Take 1 capsule (12.5 mg total) by mouth daily., Disp: 90 capsule, Rfl: 0 .  losartan (COZAAR) 100 MG tablet, Take 100 mg by mouth daily. , Disp: , Rfl:  .  Multiple Vitamin (MULTIVITAMIN PO), Take by mouth daily., Disp: , Rfl:  .  Multiple Vitamins-Minerals (ZINC PO), Take by mouth daily., Disp: , Rfl:  .  naproxen  (NAPROSYN) 500 MG tablet, Take 1 tablet (500 mg total) by mouth daily as needed., Disp: 60 tablet, Rfl: 1 .  simvastatin (ZOCOR) 40 MG tablet, Take 1 tablet (40 mg total) by mouth daily., Disp: 90 tablet, Rfl: 1 .  tamsulosin (FLOMAX) 0.4 MG CAPS capsule, Take 1 capsule (0.4 mg total) by mouth daily., Disp: 90 capsule, Rfl: 1  Review of Systems  Constitutional: Negative.  Negative for chills.  HENT: Positive for congestion, postnasal drip, sinus pressure and sinus pain. Negative for hoarse voice, sneezing and sore throat.   Respiratory: Negative.  Negative for cough.   Cardiovascular: Negative.   Gastrointestinal: Negative.   Musculoskeletal: Negative.     Social History   Tobacco Use  . Smoking status: Never Smoker  . Smokeless tobacco: Never Used  Substance Use Topics  . Alcohol use: Yes      Objective:   BP (!) 148/92 (BP Location: Left Arm, Patient Position: Sitting, Cuff Size: Normal)   Temp (!) 97.5 F (36.4 C) (Temporal)   Wt 176 lb (79.8 kg)   BMI 27.57 kg/m  Vitals:   06/24/19 1010  BP: (!) 148/92  Temp: (!) 97.5 F (36.4 C)  TempSrc: Temporal  Weight: 176 lb (79.8 kg)  Body mass index is 27.57 kg/m.   Physical Exam Constitutional:      Appearance: Normal appearance.  Cardiovascular:     Rate and Rhythm:  Normal rate and regular rhythm.     Heart sounds: Normal heart sounds.  Pulmonary:     Effort: Pulmonary effort is normal.     Breath sounds: Normal breath sounds.  Chest:     Chest wall: No tenderness.     Breasts:        Right: Normal.        Left: Normal.  Skin:    General: Skin is warm and dry.  Neurological:     Mental Status: He is alert and oriented to person, place, and time. Mental status is at baseline.  Psychiatric:        Mood and Affect: Mood normal.        Behavior: Behavior normal.      No results found for any visits on 06/24/19.     Assessment & Plan    1. Essential hypertension  - hydrochlorothiazide (MICROZIDE) 12.5  MG capsule; Take 1 capsule (12.5 mg total) by mouth daily.  Dispense: 90 capsule; Refill: 1 - losartan (COZAAR) 100 MG tablet; Take 1 tablet (100 mg total) by mouth daily.  Dispense: 90 tablet; Refill: 1  2. Chest pain, unspecified type  Seems consistent with MSK pain, evaluate as below.  - DG Chest 2 View; Future - DG Ribs Unilateral Right; Future  3. Acute non-recurrent frontal sinusitis  Recommend daily 2nd gen antihistamine.  - doxycycline (VIBRA-TABS) 100 MG tablet; Take 1 tablet (100 mg total) by mouth 2 (two) times daily for 7 days.  Dispense: 14 tablet; Refill: 0  4. Neck pain  - cyclobenzaprine (FLEXERIL) 5 MG tablet; Take 1 tablet (5 mg total) by mouth at bedtime.  Dispense: 30 tablet; Refill: 1  The entirety of the information documented in the History of Present Illness, Review of Systems and Physical Exam were personally obtained by me. Portions of this information were initially documented by [facility] and reviewed by me for thoroughness and accuracy.      Trinna Post, [name]  [facility]

## 2019-08-17 ENCOUNTER — Other Ambulatory Visit: Payer: Self-pay | Admitting: Physician Assistant

## 2019-08-17 DIAGNOSIS — R3912 Poor urinary stream: Secondary | ICD-10-CM

## 2019-08-17 DIAGNOSIS — N401 Enlarged prostate with lower urinary tract symptoms: Secondary | ICD-10-CM

## 2019-08-17 NOTE — Telephone Encounter (Signed)
Requested Prescriptions  Pending Prescriptions Disp Refills  . tamsulosin (FLOMAX) 0.4 MG CAPS capsule [Pharmacy Med Name: TAMSULOSIN HCL 0.4 MG CAPSULE] 90 capsule 1    Sig: TAKE 1 CAPSULE BY MOUTH EVERY DAY     Urology: Alpha-Adrenergic Blocker Failed - 08/17/2019  1:43 AM      Failed - Last BP in normal range    BP Readings from Last 1 Encounters:  06/24/19 (!) 148/92         Passed - Valid encounter within last 12 months    Recent Outpatient Visits          1 month ago Essential hypertension   [place], [place], [place]   2 months ago Need for shingles vaccine   [place], [place], [name]   5 months ago Hyperlipidemia, unspecified hyperlipidemia type   [place], Wendee Beavers, [place]      Future Appointments            In 1 month Hollice Espy, MD [place]

## 2019-08-25 ENCOUNTER — Ambulatory Visit
Admission: RE | Admit: 2019-08-25 | Discharge: 2019-08-25 | Disposition: A | Discharge status: Home / Self Care | Payer: BC Managed Care – PPO | Source: Ambulatory Visit | Attending: Physician Assistant | Admitting: Physician Assistant

## 2019-08-25 ENCOUNTER — Telehealth: Payer: Self-pay | Admitting: Urology

## 2019-08-25 ENCOUNTER — Encounter: Payer: Self-pay | Admitting: Physician Assistant

## 2019-08-25 ENCOUNTER — Other Ambulatory Visit: Payer: Self-pay

## 2019-08-25 ENCOUNTER — Ambulatory Visit (INDEPENDENT_AMBULATORY_CARE_PROVIDER_SITE_OTHER): Payer: BC Managed Care – PPO | Admitting: Physician Assistant

## 2019-08-25 VITALS — BP 143/89 | HR 97 | Ht 67.0 in | Wt 174.0 lb

## 2019-08-25 DIAGNOSIS — N433 Hydrocele, unspecified: Secondary | ICD-10-CM | POA: Diagnosis not present

## 2019-08-25 DIAGNOSIS — N50811 Right testicular pain: Secondary | ICD-10-CM

## 2019-08-25 MED ORDER — LEVOFLOXACIN 500 MG PO TABS: 500.0000 mg | ORAL_TABLET | Freq: Every day | ORAL | 0 refills | Status: AC

## 2019-08-25 NOTE — Progress Notes (Signed)
08/25/2019 2:39 PM   Jeneen Rinks Oliveria Oct 20, 1955 [id number]  CC: Testicular pain  HPI: Shahzad Wragg is a 64 y.o. male with PMH BPH with incomplete bladder emptying and intermittent right testicular pain with possible recurrent epididymitis who presents today for evaluation of worsened right testicular pain.  He was first seen by Dr. Erlene Quan on 03/24/2019 to establish care for the above.  Today, he reports an approximate 74-month history of chronic right testicular pain that has acutely worsened in the past 2 weeks.  He describes the pain as a constant, 6/10 dull ache limited to the right hemiscrotum and slightly exacerbated with palpation.  He denies fever, chills, nausea, vomiting, genital trauma, swelling, drainage, penile discharge, and bulges in the groin, hips, or abdomen.  He reports having previously undergone scrotal ultrasound with findings of a right hydrocele.  In-office UA today positive for trace protein; urine microscopy pan-negative.  PMH: Past Medical History:  Diagnosis Date  . Allergy   . Hyperlipidemia   . Hypertension     Surgical History: Past Surgical History:  Procedure Laterality Date  . CATARACT EXTRACTION    . COLONOSCOPY WITH PROPOFOL N/A 04/13/2019   Procedure: COLONOSCOPY WITH PROPOFOL;  Surgeon: Lucilla Lame, MD;  Location: [facility] ENDOSCOPY;  Service: Endoscopy;  Laterality: N/A;  . PITUITARY EXCISION    . TONSILLECTOMY      Home Medications:  Allergies as of 08/25/2019      Reactions   Penicillins Hives      Medication List       Accurate as of August 25, 2019  2:39 PM. If you have any questions, ask your nurse or doctor.        cyclobenzaprine 5 MG tablet Commonly known as: FLEXERIL Take 1 tablet (5 mg total) by mouth at bedtime.   finasteride 5 MG tablet Commonly known as: PROSCAR Take 1 tablet (5 mg total) by mouth daily.   hydrochlorothiazide 12.5 MG capsule Commonly known as: MICROZIDE Take 1 capsule (12.5 mg total) by mouth  daily.   losartan 100 MG tablet Commonly known as: COZAAR Take 1 tablet (100 mg total) by mouth daily.   MULTIVITAMIN PO Take by mouth daily.   naproxen 500 MG tablet Commonly known as: Naprosyn Take 1 tablet (500 mg total) by mouth daily as needed.   simvastatin 40 MG tablet Commonly known as: ZOCOR Take 1 tablet (40 mg total) by mouth daily.   tamsulosin 0.4 MG Caps capsule Commonly known as: FLOMAX TAKE 1 CAPSULE BY MOUTH EVERY DAY   ZINC PO Take by mouth daily.       Allergies:  Allergies  Allergen Reactions  . Penicillins Hives    Family History: Family History  Problem Relation Age of Onset  . Hypertension Mother   . Colon cancer Father     Social History:   reports that he has never smoked. He has never used smokeless tobacco. He reports current alcohol use. He reports that he does not use drugs.  Physical Exam: BP (!) 143/89   Pulse 97   Ht 5\' 7"  (1.702 m)   Wt 174 lb (78.9 kg)   BMI 27.25 kg/m   Constitutional:  Alert and oriented, no acute distress, nontoxic appearing HEENT: [place], AT Cardiovascular: No clubbing, cyanosis, or edema Respiratory: Normal respiratory effort, no increased work of breathing GU: Circumcised penis.  Bilateral descended testicles with intact vasa.  Diffuse right hemiscrotal fullness without crepitus, induration, fluctuance, or drainage.  Mild tenderness with palpation along the spermatic  cord. Skin: No rashes, bruises or suspicious lesions Neurologic: Grossly intact, no focal deficits, moving all 4 extremities Psychiatric: Normal mood and affect  Laboratory Data: Results for orders placed or performed in visit on 08/25/19  Microscopic Examination   URINE  Result Value Ref Range   WBC, UA 0-5 0 - 5 /hpf   RBC None seen 0 - 2 /hpf   Epithelial Cells (non renal) None seen 0 - 10 /hpf   Bacteria, UA None seen None seen/Few  Urinalysis, Complete  Result Value Ref Range   Specific Gravity, UA 1.025 1.005 - 1.030   pH, UA  6.5 5.0 - 7.5   Color, UA Yellow Yellow   Appearance Ur Clear Clear   Leukocytes,UA Negative Negative   Protein,UA Trace (A) Negative/Trace   Glucose, UA Negative Negative   Ketones, UA Negative Negative   RBC, UA Negative Negative   Bilirubin, UA Negative Negative   Urobilinogen, Ur 0.2 0.2 - 1.0 mg/dL   Nitrite, UA Negative Negative   Microscopic Examination See below:    Pertinent Imaging: Scrotal US with doppler, 08/25/2019: CLINICAL DATA:  Acute on chronic right testicular pain  EXAM: SCROTAL ULTRASOUND  DOPPLER ULTRASOUND OF THE TESTICLES  TECHNIQUE: Complete ultrasound examination of the testicles, epididymis, and other scrotal structures was performed. Color and spectral Doppler ultrasound were also utilized to evaluate blood flow to the testicles.  COMPARISON:  None.  FINDINGS: Right testicle  Measurements: 5.0 x 2.4 x 3.2 cm. Homogeneous echogenicity. Normal blood flow. No mass or microlithiasis visualized. Small scrotal pearl in the inferiorly, typically incidental.  Left testicle  Measurements: 5.3 x 2.4 x 3.2 cm. Homogeneous echogenicity. Normal blood flow. No mass or microlithiasis visualized.  Right epididymis:  Normal in size and appearance.  Left epididymis:  Normal in size and appearance.  Hydrocele:  Small bilateral.  Varicocele:  None visualized.  Pulsed Doppler interrogation of both testes demonstrates normal low resistance arterial and venous waveforms bilaterally.  No hernia demonstrated sonographically in the right inguinal region.  IMPRESSION: 1. Small bilateral hydroceles. 2. Unremarkable sonographic appearance of both testis with normal blood flow. 3. Small right scrotal pearl, typically incidental.   Electronically Signed   By: Keith Rake M.D.   On: 08/25/2019 16:22  I personally reviewed the images referenced above and note small bilateral hydroceles with normal bilateral testicular blood  flow.  Assessment & Plan:   1. Pain in right testicle 64 year old male with a history of BPH with incomplete bladder emptying and intermittent right testicular pain presents today with a 2-week history of acute on chronic right testicular pain.  No recent scrotal trauma; physical exam notable only for diffuse fullness of the right hemiscrotum, reported to be stable per patient.  UA benign, will send for culture to confirm.  Leading differential includes epididymoorchitis versus inflamed hydrocele. I ordered STAT scrotal ultrasound with Doppler today to rule out urologic emergencies, which was reassuring for torsion or rupture.    Starting him on levofloxacin 500 mg daily x10 days for empiric treatment of epididymoorchitis, encouraged him to take ibuprofen 40 mg every 6 as needed, and counseled him to contact the office if he develops abdominal pain, fever, or chills. - Urinalysis, Complete - levofloxacin (LEVAQUIN) 500 MG tablet; Take 1 tablet (500 mg total) by mouth daily for 10 days.  Dispense: 10 tablet; Refill: 0 - US SCROTUM W/DOPPLER - CULTURE, URINE COMPREHENSIVE   Return if symptoms worsen or fail to improve.  Debroah Loop, [name]  [facility] [address], [place], [place] [address] [phone]

## 2019-08-25 NOTE — Telephone Encounter (Signed)
Pt states he is having R testicular pain and was advised that an [place] may be ordered for further eval. Please advise pt at [phone].

## 2019-08-25 NOTE — Telephone Encounter (Signed)
Called patient and scheduled him for appt today with Sam for further evaluation

## 2019-08-25 NOTE — Telephone Encounter (Signed)
Please schedule visit with PA for exam and scrotal ultrasound order

## 2019-08-26 LAB — MICROSCOPIC EXAMINATION
Bacteria, UA: NONE SEEN
Epithelial Cells (non renal): NONE SEEN /HPF (ref 0–10)
RBC, Urine: NONE SEEN /HPF (ref 0–2)

## 2019-08-26 LAB — URINALYSIS, COMPLETE
Bilirubin, UA: NEGATIVE
Glucose, UA: NEGATIVE
Ketones, UA: NEGATIVE
Leukocytes,UA: NEGATIVE
Nitrite, UA: NEGATIVE
RBC, UA: NEGATIVE
Specific Gravity, UA: 1.025 (ref 1.005–1.030)
Urobilinogen, Ur: 0.2 mg/dL (ref 0.2–1.0)
pH, UA: 6.5 (ref 5.0–7.5)

## 2019-08-28 LAB — CULTURE, URINE COMPREHENSIVE

## 2019-09-01 ENCOUNTER — Encounter: Payer: Self-pay | Admitting: Physician Assistant

## 2019-09-01 ENCOUNTER — Ambulatory Visit (INDEPENDENT_AMBULATORY_CARE_PROVIDER_SITE_OTHER): Payer: BC Managed Care – PPO | Admitting: Physician Assistant

## 2019-09-01 ENCOUNTER — Other Ambulatory Visit: Payer: Self-pay

## 2019-09-01 VITALS — BP 146/89 | HR 96 | Temp 97.1°F | Ht 67.0 in | Wt 176.0 lb

## 2019-09-01 DIAGNOSIS — I10 Essential (primary) hypertension: Secondary | ICD-10-CM | POA: Diagnosis not present

## 2019-09-01 DIAGNOSIS — L989 Disorder of the skin and subcutaneous tissue, unspecified: Secondary | ICD-10-CM | POA: Diagnosis not present

## 2019-09-01 DIAGNOSIS — Z0000 Encounter for general adult medical examination without abnormal findings: Secondary | ICD-10-CM | POA: Diagnosis not present

## 2019-09-01 DIAGNOSIS — E785 Hyperlipidemia, unspecified: Secondary | ICD-10-CM

## 2019-09-01 NOTE — Progress Notes (Signed)
Patient: Brett Wilson, Male    DOB: 31-May-1956, 64 y.o.   MRN: [id number] Visit Date: 09/01/2019  Today's Provider: Trinna Post, [name]   Chief Complaint  Patient presents with  . Annual Exam   Subjective:    Annual physical exam Brett Wilson is a 64 y.o. male who presents today for health maintenance and complete physical. He feels well. He reports exercising regularly. He reports he is sleeping well.  Currently on levofloxacin from urology for epididymoorchitis starting 08/25/2019. Scrotal ultrasound at that time was negative for acute process. He Is also taking flomax and finasteride for BPH.   Currently taking losartan 100 mg QD and HCTZ 12.5 mg QD for HTN. Reports BP has run high in office but is normal at home. Brought BP cuff to compare with office and it is the same.   Usually gets skin checks, needs local dermatologist.  ----------------------------------------------------------------- Last colonoscopy:04/13/2019, two polyps repeat 5 years  Review of Systems  Constitutional: Negative.   HENT: Negative.   Eyes: Negative.   Respiratory: Negative.   Cardiovascular: Negative.   Gastrointestinal: Positive for abdominal pain. Negative for abdominal distention, anal bleeding, blood in stool, constipation, diarrhea, nausea, rectal pain and vomiting.  Endocrine: Negative.   Genitourinary: Positive for difficulty urinating. Negative for decreased urine volume, discharge, dysuria, enuresis, flank pain, frequency, genital sores, hematuria, penile pain, penile swelling, scrotal swelling, testicular pain and urgency.  Musculoskeletal: Positive for neck pain. Negative for arthralgias, back pain, gait problem, joint swelling, myalgias and neck stiffness.  Skin: Negative.     Social History He  reports that he has never smoked. He has never used smokeless tobacco. He reports current alcohol use. He reports that he does not use drugs. Social History   Socioeconomic History  .  Marital status: Married    Spouse name: Not on file  . Number of children: Not on file  . Years of education: Not on file  . Highest education level: Not on file  Occupational History  . Not on file  Tobacco Use  . Smoking status: Never Smoker  . Smokeless tobacco: Never Used  Substance and Sexual Activity  . Alcohol use: Yes  . Drug use: Never  . Sexual activity: Not on file  Other Topics Concern  . Not on file  Social History Narrative  . Not on file   Social Determinants of Health   Financial Resource Strain:   . Difficulty of Paying Living Expenses:   Food Insecurity:   . Worried About Charity fundraiser in the Last Year:   . Arboriculturist in the Last Year:   Transportation Needs:   . Film/video editor (Medical):   Marland Kitchen Lack of Transportation (Non-Medical):   Physical Activity:   . Days of Exercise per Week:   . Minutes of Exercise per Session:   Stress:   . Feeling of Stress :   Social Connections:   . Frequency of Communication with Friends and Family:   . Frequency of Social Gatherings with Friends and Family:   . Attends Religious Services:   . Active Member of Clubs or Organizations:   . Attends Archivist Meetings:   Marland Kitchen Marital Status:     Patient Active Problem List   Diagnosis Date Noted  . Family history of malignant neoplasm of gastrointestinal tract   . Polyp of descending colon   . Benign prostatic hyperplasia with weak urinary stream 02/25/2019  . Arthritis 02/25/2019  .  Testicular pain 02/25/2019  . Hyperlipidemia 02/25/2019  . Essential hypertension 02/25/2019    Past Surgical History:  Procedure Laterality Date  . CATARACT EXTRACTION    . COLONOSCOPY WITH PROPOFOL N/A 04/13/2019   Procedure: COLONOSCOPY WITH PROPOFOL;  Surgeon: Lucilla Lame, MD;  Location: [facility] ENDOSCOPY;  Service: Endoscopy;  Laterality: N/A;  . PITUITARY EXCISION    . TONSILLECTOMY      Family History  Family Status  Relation Name Status  . Mother   (Not Specified)  . Father  (Not Specified)   His family history includes Colon cancer in his father; Hypertension in his mother.     Allergies  Allergen Reactions  . Penicillins Hives    Previous Medications   CYCLOBENZAPRINE (FLEXERIL) 5 MG TABLET    Take 1 tablet (5 mg total) by mouth at bedtime.   FINASTERIDE (PROSCAR) 5 MG TABLET    Take 1 tablet (5 mg total) by mouth daily.   HYDROCHLOROTHIAZIDE (MICROZIDE) 12.5 MG CAPSULE    Take 1 capsule (12.5 mg total) by mouth daily.   LEVOFLOXACIN (LEVAQUIN) 500 MG TABLET    Take 1 tablet (500 mg total) by mouth daily for 10 days.   LOSARTAN (COZAAR) 100 MG TABLET    Take 1 tablet (100 mg total) by mouth daily.   MULTIPLE VITAMIN (MULTIVITAMIN PO)    Take by mouth daily.   MULTIPLE VITAMINS-MINERALS (ZINC PO)    Take by mouth daily.   NAPROXEN (NAPROSYN) 500 MG TABLET    Take 1 tablet (500 mg total) by mouth daily as needed.   SIMVASTATIN (ZOCOR) 40 MG TABLET    Take 1 tablet (40 mg total) by mouth daily.   TAMSULOSIN (FLOMAX) 0.4 MG CAPS CAPSULE    TAKE 1 CAPSULE BY MOUTH EVERY DAY    Patient Care Team: [name] as PCP - General (Physician Assistant)      Objective:   Vitals: BP (!) 146/89 (BP Location: Left Arm, Patient Position: Sitting, Cuff Size: Large)   Pulse 96   Temp (!) 97.1 F (36.2 C) (Temporal)   Ht 5\' 7"  (1.702 m)   Wt 176 lb (79.8 kg)   BMI 27.57 kg/m    Physical Exam Constitutional:      Appearance: Normal appearance.  Cardiovascular:     Rate and Rhythm: Normal rate and regular rhythm.     Heart sounds: Normal heart sounds.  Pulmonary:     Effort: Pulmonary effort is normal.     Breath sounds: Normal breath sounds.  Abdominal:     General: Bowel sounds are normal.     Palpations: Abdomen is soft.  Skin:    General: Skin is warm and dry.  Neurological:     Mental Status: He is alert and oriented to person, place, and time. Mental status is at baseline.  Psychiatric:        Mood and  Affect: Mood normal.        Behavior: Behavior normal.      Depression Screen PHQ 2/9 Scores 09/01/2019 02/24/2019  PHQ - 2 Score 0 0  PHQ- 9 Score 0 0      Assessment & Plan:     Routine Health Maintenance and Physical Exam  Exercise Activities and Dietary recommendations Goals   None     Immunization History  Administered Date(s) Administered  . Influenza,inj,Quad PF,6+ Mos 02/24/2019  . Zoster Recombinat (Shingrix) 06/02/2019    Health Maintenance  Topic Date Due  . Hepatitis C  Screening  Never done  . HIV Screening  Never done  . TETANUS/TDAP  Never done  . COLONOSCOPY  04/12/2024  . INFLUENZA VACCINE  Completed     Discussed health benefits of physical activity, and encouraged him to engage in regular exercise appropriate for his age and condition.    1. Annual physical exam  - TSH - Lipid panel - Comprehensive metabolic panel - CBC with Differential/Platelet - PSA - Hepatitis c antibody (reflex) - HIV antibody (with reflex)  2. Essential hypertension  Reports normotensive readings at home. Continue current BP medication.  3. Hyperlipidemia, unspecified hyperlipidemia type  Continue statin.  4. Skin lesion  If he needs referral, I will be happy to place it. If his insurance does not require it, he should be able to schedule himself.   The entirety of the information documented in the History of Present Illness, Review of Systems and Physical Exam were personally obtained by me. Portions of this information were initially documented by [facility] and reviewed by me for thoroughness and accuracy.   F/u 6 months   --------------------------------------------------------------------

## 2019-09-01 NOTE — Patient Instructions (Signed)

## 2019-09-03 ENCOUNTER — Encounter: Payer: Self-pay | Admitting: Physician Assistant

## 2019-09-03 LAB — CBC WITH DIFFERENTIAL/PLATELET
Basophils Absolute: 0.1 10*3/uL (ref 0.0–0.2)
Basos: 1 %
EOS (ABSOLUTE): 0.3 10*3/uL (ref 0.0–0.4)
Eos: 3 %
Hematocrit: 42.8 % (ref 37.5–51.0)
Hemoglobin: 14.8 g/dL (ref 13.0–17.7)
Immature Grans (Abs): 0.1 10*3/uL (ref 0.0–0.1)
Immature Granulocytes: 1 %
Lymphocytes Absolute: 1.7 10*3/uL (ref 0.7–3.1)
Lymphs: 21 %
MCH: 31.3 pg (ref 26.6–33.0)
MCHC: 34.6 g/dL (ref 31.5–35.7)
MCV: 91 fL (ref 79–97)
Monocytes Absolute: 0.8 10*3/uL (ref 0.1–0.9)
Monocytes: 9 %
Neutrophils Absolute: 5.4 10*3/uL (ref 1.4–7.0)
Neutrophils: 65 %
Platelets: 265 10*3/uL (ref 150–450)
RBC: 4.73 x10E6/uL (ref 4.14–5.80)
RDW: 12.4 % (ref 11.6–15.4)
WBC: 8.2 10*3/uL (ref 3.4–10.8)

## 2019-09-03 LAB — PSA: Prostate Specific Ag, Serum: 1.6 ng/mL (ref 0.0–4.0)

## 2019-09-03 LAB — LIPID PANEL
Chol/HDL Ratio: 4.2 ratio (ref 0.0–5.0)
Cholesterol, Total: 179 mg/dL (ref 100–199)
HDL: 43 mg/dL (ref >39)
LDL Chol Calc (NIH): 102 mg/dL — ABNORMAL HIGH (ref 0–99)
Triglycerides: 197 mg/dL — ABNORMAL HIGH (ref 0–149)
VLDL Cholesterol Cal: 34 mg/dL (ref 5–40)

## 2019-09-03 LAB — HCV AB VERIFICATION: HCV Antibody Verification: NONREACTIVE

## 2019-09-03 LAB — COMPREHENSIVE METABOLIC PANEL
ALT: 24 IU/L (ref 0–44)
AST: 35 IU/L (ref 0–40)
Albumin/Globulin Ratio: 1.8 (ref 1.2–2.2)
Albumin: 4.6 g/dL (ref 3.8–4.8)
Alkaline Phosphatase: 100 IU/L (ref 39–117)
BUN/Creatinine Ratio: 18 (ref 10–24)
BUN: 18 mg/dL (ref 8–27)
Bilirubin Total: 0.6 mg/dL (ref 0.0–1.2)
CO2: 25 mmol/L (ref 20–29)
Calcium: 9.9 mg/dL (ref 8.6–10.2)
Chloride: 101 mmol/L (ref 96–106)
Creatinine, Ser: 1.02 mg/dL (ref 0.76–1.27)
GFR calc Af Amer: 90 mL/min/{1.73_m2} (ref >59)
GFR calc non Af Amer: 78 mL/min/{1.73_m2} (ref >59)
Globulin, Total: 2.6 g/dL (ref 1.5–4.5)
Glucose: 95 mg/dL (ref 65–99)
Potassium: 4.2 mmol/L (ref 3.5–5.2)
Sodium: 143 mmol/L (ref 134–144)
Total Protein: 7.2 g/dL (ref 6.0–8.5)

## 2019-09-03 LAB — COMMENT4 - HEP PANEL

## 2019-09-03 LAB — TSH: TSH: 1.86 u[IU]/mL (ref 0.450–4.500)

## 2019-09-03 LAB — HEPATITIS C ANTIBODY (REFLEX): HCV Ab: 5.1 s/co ratio — ABNORMAL HIGH (ref 0.0–0.9)

## 2019-09-03 LAB — HIV ANTIBODY (ROUTINE TESTING W REFLEX): HIV Screen 4th Generation wRfx: NONREACTIVE

## 2019-09-21 NOTE — Progress Notes (Signed)
09/22/19 9:59 AM   Brett Wilson 1955-10-03 [id number]  Referring provider: Trinna Post, [name] [address] [place],  [place] [address]  Chief Complaint  Patient presents with  . Benign Prostatic Hypertrophy    HPI: Brett Wilson is a 64 y.o. M with hx of testicular pain and urinary symptoms returns today for a 6 month f/u.   Currently on maximum medical therapy of finasteride and flomax.   Returned to clinic on 08/25/19 c/o of worsened testicular pain within last 2 weeks. He described the pain as constant, 6/10 dull ache limited to right hemiscrotum and slightly exacerbated with palpation.   He was followed up with a Scrotal US which indicated small bilateral hydroceles and small right scrotal pearl.   He was treated with Abx and pain resolved in 3 days.   He reports of frequency, difficulty urinating and weak stream. He is currently on Flomax and finasteride with refractory symptoms. PVR 91 mL.   He is interested in Urolift procedure.   IPSS    Row Name 09/22/19 1100         International Prostate Symptom Score   How often have you had the sensation of not emptying your bladder?  Less than half the time     How often have you had to urinate less than every two hours?  Less than half the time     How often have you found you stopped and started again several times when you urinated?  About half the time     How often have you found it difficult to postpone urination?  Less than 1 in 5 times     How often have you had a weak urinary stream?  About half the time     How often have you had to strain to start urination?  About half the time     How many times did you typically get up at night to urinate?  2 Times     Total IPSS Score  16       Quality of Life due to urinary symptoms   If you were to spend the rest of your life with your urinary condition just the way it is now how would you feel about that?  Mostly Disatisfied        Score:  1-7 Mild 8-19  Moderate 20-35 Severe  PMH: Past Medical History:  Diagnosis Date  . Allergy   . Hyperlipidemia   . Hypertension     Surgical History: Past Surgical History:  Procedure Laterality Date  . CATARACT EXTRACTION    . COLONOSCOPY WITH PROPOFOL N/A 04/13/2019   Procedure: COLONOSCOPY WITH PROPOFOL;  Surgeon: Lucilla Lame, MD;  Location: [facility] ENDOSCOPY;  Service: Endoscopy;  Laterality: N/A;  . PITUITARY EXCISION    . TONSILLECTOMY      Home Medications:  Allergies as of 09/22/2019      Reactions   Penicillins Hives      Medication List       Accurate as of September 22, 2019 11:59 PM. If you have any questions, ask your nurse or doctor.        cyclobenzaprine 5 MG tablet Commonly known as: FLEXERIL Take 1 tablet (5 mg total) by mouth at bedtime.   finasteride 5 MG tablet Commonly known as: PROSCAR Take 1 tablet (5 mg total) by mouth daily.   hydrochlorothiazide 12.5 MG capsule Commonly known as: MICROZIDE Take 1 capsule (12.5 mg total) by mouth daily.  losartan 100 MG tablet Commonly known as: COZAAR Take 1 tablet (100 mg total) by mouth daily.   MULTIVITAMIN PO Take by mouth daily.   naproxen 500 MG tablet Commonly known as: Naprosyn Take 1 tablet (500 mg total) by mouth daily as needed.   simvastatin 40 MG tablet Commonly known as: ZOCOR Take 1 tablet (40 mg total) by mouth daily.   tamsulosin 0.4 MG Caps capsule Commonly known as: FLOMAX TAKE 1 CAPSULE BY MOUTH EVERY DAY   ZINC PO Take by mouth daily.       Allergies:  Allergies  Allergen Reactions  . Penicillins Hives    Family History: Family History  Problem Relation Age of Onset  . Hypertension Mother   . Colon cancer Father     Social History:  reports that he has never smoked. He has never used smokeless tobacco. He reports current alcohol use. He reports that he does not use drugs.   Physical Exam: BP (!) 160/92   Pulse 84   Ht 5\' 7"  (1.702 m)   Wt 174 lb (78.9 kg)   BMI 27.25  kg/m   Constitutional:  Alert and oriented, No acute distress. HEENT:  AT, moist mucus membranes.  Trachea midline, no masses. Cardiovascular: No clubbing, cyanosis, or edema. Respiratory: Normal respiratory effort, no increased work of breathing. Skin: No rashes, bruises or suspicious lesions. Neurologic: Grossly intact, no focal deficits, moving all 4 extremities. Psychiatric: Normal mood and affect.  Laboratory Data: Lab Results  Component Value Date   CREATININE 1.02 09/01/2019   Pertinent Imaging: Results for orders placed or performed in visit on 09/22/19  BLADDER SCAN AMB NON-IMAGING  Result Value Ref Range   Scan Result 91 ML    CLINICAL DATA:  Acute on chronic right testicular pain  EXAM: SCROTAL ULTRASOUND  DOPPLER ULTRASOUND OF THE TESTICLES  TECHNIQUE: Complete ultrasound examination of the testicles, epididymis, and other scrotal structures was performed. Color and spectral Doppler ultrasound were also utilized to evaluate blood flow to the testicles.  COMPARISON:  None.  FINDINGS: Right testicle  Measurements: 5.0 x 2.4 x 3.2 cm. Homogeneous echogenicity. Normal blood flow. No mass or microlithiasis visualized. Small scrotal pearl in the inferiorly, typically incidental.  Left testicle  Measurements: 5.3 x 2.4 x 3.2 cm. Homogeneous echogenicity. Normal blood flow. No mass or microlithiasis visualized.  Right epididymis:  Normal in size and appearance.  Left epididymis:  Normal in size and appearance.  Hydrocele:  Small bilateral.  Varicocele:  None visualized.  Pulsed Doppler interrogation of both testes demonstrates normal low resistance arterial and venous waveforms bilaterally.  No hernia demonstrated sonographically in the right inguinal region.  IMPRESSION: 1. Small bilateral hydroceles. 2. Unremarkable sonographic appearance of both testis with normal blood flow. 3. Small right scrotal pearl, typically  incidental.   Electronically Signed   By: Keith Rake M.D.   On: 08/25/2019 16:22  I have personally reviewed the images and agree with radiologist interpretation.   Assessment & Plan:    1. BPH with urinary obstruction Maximum medical management with finasteride and flomax with refractory symptoms  Interested in outlet procedure, specifically Urolift  Already educated himself on procedure Return for cysto/TRUS at next available appt   2. Epididymitis   Resolved  3. Hx of incomplete bladder emptying Previously noted to have minimally elevated PVR of 114 mL, remains slightly elevated today.   Schedule TRUS/ [facility] [address], [place],  [address] [phone]  Jamas Lav, am acting as a scribe for Dr. Hollice Espy,  I have reviewed the above documentation for accuracy and completeness, and I agree with the above.   Hollice Espy, MD

## 2019-09-22 ENCOUNTER — Other Ambulatory Visit: Payer: Self-pay

## 2019-09-22 ENCOUNTER — Encounter: Payer: Self-pay | Admitting: Urology

## 2019-09-22 ENCOUNTER — Ambulatory Visit: Payer: BC Managed Care – PPO | Admitting: Urology

## 2019-09-22 VITALS — BP 160/92 | HR 84 | Ht 67.0 in | Wt 174.0 lb

## 2019-09-22 DIAGNOSIS — R339 Retention of urine, unspecified: Secondary | ICD-10-CM

## 2019-09-22 LAB — BLADDER SCAN AMB NON-IMAGING: Scan Result: 91

## 2019-09-22 NOTE — Patient Instructions (Signed)

## 2019-10-19 ENCOUNTER — Ambulatory Visit (INDEPENDENT_AMBULATORY_CARE_PROVIDER_SITE_OTHER): Payer: BC Managed Care – PPO | Admitting: Physician Assistant

## 2019-10-19 ENCOUNTER — Other Ambulatory Visit: Payer: Self-pay

## 2019-10-19 DIAGNOSIS — Z23 Encounter for immunization: Secondary | ICD-10-CM

## 2019-10-19 NOTE — Progress Notes (Deleted)
     Established patient visit   Patient: Brett Wilson   DOB: February 12, 1956   64 y.o. Male  MRN: [id number] Visit Date: 10/19/2019  Today's healthcare provider: Trinna Post, [name]   No chief complaint on file.  Subjective    HPI Patient presents today for Tdap and second shingles vaccine. Patient tolerated vaccines well.  {Show patient history (optional):23778::" "}   Medications: Outpatient Medications Prior to Visit  Medication Sig  . cyclobenzaprine (FLEXERIL) 5 MG tablet Take 1 tablet (5 mg total) by mouth at bedtime.  . finasteride (PROSCAR) 5 MG tablet Take 1 tablet (5 mg total) by mouth daily.  . hydrochlorothiazide (MICROZIDE) 12.5 MG capsule Take 1 capsule (12.5 mg total) by mouth daily.  Marland Kitchen losartan (COZAAR) 100 MG tablet Take 1 tablet (100 mg total) by mouth daily.  . Multiple Vitamin (MULTIVITAMIN PO) Take by mouth daily.  . Multiple Vitamins-Minerals (ZINC PO) Take by mouth daily.  . naproxen (NAPROSYN) 500 MG tablet Take 1 tablet (500 mg total) by mouth daily as needed.  . simvastatin (ZOCOR) 40 MG tablet Take 1 tablet (40 mg total) by mouth daily.  . tamsulosin (FLOMAX) 0.4 MG CAPS capsule TAKE 1 CAPSULE BY MOUTH EVERY DAY   No facility-administered medications prior to visit.    Review of Systems  {Show previous labs (optional):23779::" "}  Objective    There were no vitals taken for this visit. {Show previous vital signs (optional):23777::" "}  Physical Exam  ***  No results found for any visits on 10/19/19.  Assessment & Plan     ***  No follow-ups on file.      {provider attestation***:1}   [name]  [facility] [phone] (phone) [phone] (fax)  [place]

## 2019-10-19 NOTE — Progress Notes (Signed)
     Established patient visit   Patient: Brett Wilson   DOB: [birth year]   64 y.o. Male  MRN: [id number] Visit Date: 10/19/2019  Today's healthcare provider: Trinna Post, [name]   Chief Complaint  Patient presents with  . Immunizations   Subjective    HPI  Patient presents for Tdap and Shingrix vaccines.      Medications: Outpatient Medications Prior to Visit  Medication Sig  . cyclobenzaprine (FLEXERIL) 5 MG tablet Take 1 tablet (5 mg total) by mouth at bedtime.  . finasteride (PROSCAR) 5 MG tablet Take 1 tablet (5 mg total) by mouth daily.  . hydrochlorothiazide (MICROZIDE) 12.5 MG capsule Take 1 capsule (12.5 mg total) by mouth daily.  Marland Kitchen losartan (COZAAR) 100 MG tablet Take 1 tablet (100 mg total) by mouth daily.  . Multiple Vitamin (MULTIVITAMIN PO) Take by mouth daily.  . Multiple Vitamins-Minerals (ZINC PO) Take by mouth daily.  . naproxen (NAPROSYN) 500 MG tablet Take 1 tablet (500 mg total) by mouth daily as needed.  . simvastatin (ZOCOR) 40 MG tablet Take 1 tablet (40 mg total) by mouth daily.  . tamsulosin (FLOMAX) 0.4 MG CAPS capsule TAKE 1 CAPSULE BY MOUTH EVERY DAY   No facility-administered medications prior to visit.    Review of Systems    Objective    There were no vitals taken for this visit.   Physical Exam    No results found for any visits on 10/19/19.  Assessment & Plan    1. Need for diphtheria-tetanus-pertussis (Tdap) vaccine  - Tdap vaccine greater than or equal to 7yo IM  2. Need for shingles vaccine  - Varicella-zoster vaccine IM    Return if symptoms worsen or fail to improve.      Patient here for Tdap and shingrix vaccination only.  I did not examine the patient.  I did review his medical history, medications, and allergies and vaccine consent form.  CMA gave vaccination. Patient tolerated well.  [name], MPH [facility] 10/19/2019 3:41 PM  I, Trinna Post, [name], have reviewed all  documentation for this visit. The documentation on 10/19/19 for the exam, diagnosis, procedures, and orders are all accurate and complete.    [name]  [facility] [phone] (phone) [phone] (fax)  [place]

## 2019-10-26 NOTE — Progress Notes (Signed)
10/27/2019   Chief Complaint  Patient presents with  . Cysto/TRUS   HPI: 64 year old male with refractory urinary symptoms related to BPH who presents today to the office for cystoscopy and prostate sizing   Please see previous notes for details.     Today's Vitals   10/27/19 0845  BP: (!) 173/80  Pulse: 98  NED. A&Ox3.   No respiratory distress   Abd soft, NT, ND Normal phallus with bilateral descended testicles    Cystoscopy Procedure Note  Patient identification was confirmed, informed consent was obtained, and patient was prepped using Betadine solution.  Lidocaine jelly was administered per urethral meatus.    Preoperative abx where received prior to procedure.     Pre-Procedure: - Inspection reveals a normal caliber ureteral meatus.  Procedure: The flexible cystoscope was introduced without difficulty - No urethral strictures/lesions are present. - Relatively short prostate w/ trilobar coaptation and small well circumscribed medium lobe  - Elevated bladder neck - Bilateral ureteral orifices identified - Bladder mucosa  reveals no ulcers, tumors, or lesions - No bladder stones - Trabeculated bladder   Post-Procedure: - Patient tolerated the procedure well  Prostate transrectal ultrasound sizing   Informed consent was obtained after discussing risks/benefits of the procedure.  A time out was performed to ensure correct patient identity.   Pre-Procedure: -Transrectal probe was placed without difficulty -Transrectal Ultrasound performed revealing a 36.08 gm prostate measuring 3.26 x 4.09 x 5.16 cm (length) -Small well circumscribed medium lobe appreciated w/ slight intravesical obstruction    Assessment/ Plan:  1. BPH w/ urinary obstruction   Maximum medical management with finasteride and flomax with refractory symptoms  Interested in Urolift for obstruction Presence of small medium lobe makes Urolift more complicated and may not adequately address median  lobe. Recommended addressing medium lobe separately through TUR vs. holmium laser enucleation followed by Urolift clip placement if desires Urolift as hybrid procedure.  Alternatively standard TURP could also be considered.  Risks and benefits of both discussed.    Risk including bleeding, infection, damage surrounding structures, retrograde ejaculation, incontinence, amongst others were all reviewed.  He is interested in laser enucleation of the small median lobe which can likely be evacuated from the bladder using forceps rather than morcellation along with UroLift as a hybrid type procedure.   He understood the risk and benefits including post procedural foley catheter use.   Jamas Lav, am acting as a scribe for Dr. Hollice Espy,  I have reviewed the above documentation for accuracy and completeness, and I agree with the above.   Hollice Espy, MD

## 2019-10-27 ENCOUNTER — Ambulatory Visit (INDEPENDENT_AMBULATORY_CARE_PROVIDER_SITE_OTHER): Payer: BC Managed Care – PPO | Admitting: Urology

## 2019-10-27 ENCOUNTER — Other Ambulatory Visit: Payer: Self-pay | Admitting: Radiology

## 2019-10-27 ENCOUNTER — Ambulatory Visit: Payer: BC Managed Care – PPO | Admitting: Physician Assistant

## 2019-10-27 ENCOUNTER — Encounter: Payer: Self-pay | Admitting: Urology

## 2019-10-27 ENCOUNTER — Encounter: Payer: Self-pay | Admitting: Physician Assistant

## 2019-10-27 ENCOUNTER — Other Ambulatory Visit: Payer: Self-pay

## 2019-10-27 VITALS — BP 138/86 | HR 85 | Temp 96.9°F | Wt 173.4 lb

## 2019-10-27 VITALS — BP 173/80 | HR 98

## 2019-10-27 DIAGNOSIS — H00012 Hordeolum externum right lower eyelid: Secondary | ICD-10-CM

## 2019-10-27 DIAGNOSIS — R339 Retention of urine, unspecified: Secondary | ICD-10-CM

## 2019-10-27 DIAGNOSIS — N138 Other obstructive and reflux uropathy: Secondary | ICD-10-CM

## 2019-10-27 DIAGNOSIS — N401 Enlarged prostate with lower urinary tract symptoms: Secondary | ICD-10-CM

## 2019-10-27 LAB — URINALYSIS, COMPLETE
Bilirubin, UA: NEGATIVE
Glucose, UA: NEGATIVE
Ketones, UA: NEGATIVE
Leukocytes,UA: NEGATIVE
Nitrite, UA: NEGATIVE
Protein,UA: NEGATIVE
RBC, UA: NEGATIVE
Specific Gravity, UA: 1.025 (ref 1.005–1.030)
Urobilinogen, Ur: 0.2 mg/dL (ref 0.2–1.0)
pH, UA: 7 (ref 5.0–7.5)

## 2019-10-27 LAB — MICROSCOPIC EXAMINATION: Bacteria, UA: NONE SEEN

## 2019-10-27 MED ORDER — ERYTHROMYCIN 5 MG/GM OP OINT: 1.0000 "application " | TOPICAL_OINTMENT | Freq: Every day | OPHTHALMIC | 0 refills | Status: DC

## 2019-10-27 NOTE — Patient Instructions (Signed)

## 2019-10-27 NOTE — Progress Notes (Signed)
Established patient visit   Patient: Brett Wilson   DOB: May 10, 1956   64 y.o. Male  MRN: [id number] Visit Date: 10/27/2019  Today's healthcare provider: Trinna Post, [name]   Chief Complaint  Patient presents with  . Stye  I,Porsha C McClurkin,acting as a scribe for Trinna Post, [name].,have documented all relevant documentation on the behalf of Trinna Post, [name],as directed by  Trinna Post, [name] while in the presence of Trinna Post, [name].  Subjective    Eye Pain  The right eye is affected. This is a new problem. The current episode started 1 to 4 weeks ago. The problem occurs constantly. The problem has been gradually worsening. There was no injury mechanism. The pain is at a severity of 4/10. The pain is mild. There is no known exposure to pink eye. He does not wear contacts. Associated symptoms include eye redness. Pertinent negatives include no blurred vision, eye discharge, double vision, itching or photophobia. He has tried nothing for the symptoms. The treatment provided no relief.  Reports applying hot compress to the eye.      Medications: Outpatient Medications Prior to Visit  Medication Sig  . cyclobenzaprine (FLEXERIL) 5 MG tablet Take 1 tablet (5 mg total) by mouth at bedtime.  . finasteride (PROSCAR) 5 MG tablet Take 1 tablet (5 mg total) by mouth daily.  . hydrochlorothiazide (MICROZIDE) 12.5 MG capsule Take 1 capsule (12.5 mg total) by mouth daily.  Marland Kitchen losartan (COZAAR) 100 MG tablet Take 1 tablet (100 mg total) by mouth daily.  . Multiple Vitamin (MULTIVITAMIN PO) Take by mouth daily.  . Multiple Vitamins-Minerals (ZINC PO) Take by mouth daily.  . naproxen (NAPROSYN) 500 MG tablet Take 1 tablet (500 mg total) by mouth daily as needed.  . simvastatin (ZOCOR) 40 MG tablet Take 1 tablet (40 mg total) by mouth daily.  . tamsulosin (FLOMAX) 0.4 MG CAPS capsule TAKE 1 CAPSULE BY MOUTH EVERY DAY   No facility-administered medications prior to  visit.    Review of Systems  Constitutional: Negative.   Eyes: Positive for pain and redness. Negative for blurred vision, double vision, photophobia, discharge and itching.  Respiratory: Negative.   Cardiovascular: Negative.   Musculoskeletal: Negative.       Objective    BP 138/86 (BP Location: Right Arm, Patient Position: Sitting, Cuff Size: Normal)   Pulse 85   Temp (!) 96.9 F (36.1 C) (Temporal)   Wt 173 lb 6.4 oz (78.7 kg)   SpO2 98%   BMI 27.16 kg/m    Physical Exam Constitutional:      Appearance: Normal appearance.  Eyes:     General:        Right eye: Hordeolum present.     Extraocular Movements: Extraocular movements intact.     Conjunctiva/sclera: Conjunctivae normal.     Pupils: Pupils are equal, round, and reactive to light.  Skin:    General: Skin is warm and dry.  Neurological:     General: No focal deficit present.     Mental Status: He is alert and oriented to person, place, and time.  Psychiatric:        Mood and Affect: Mood normal.        Behavior: Behavior normal.       No results found for any visits on 10/27/19.  Assessment & Plan    1. Hordeolum externum of right lower eyelid Patient has a stye on right lower eyelid. Patient was prescribed  an ointment and referral placed to  eye. - Ambulatory referral to Ophthalmology - erythromycin ophthalmic ointment; Place 1 application into the right eye at bedtime.  Dispense: 3.5 g; Refill: 0   Return if symptoms worsen or fail to improve.      ITrinna Post, [name], have reviewed all documentation for this visit. The documentation on 10/27/19 for the exam, diagnosis, procedures, and orders are all accurate and complete.    [name]  [facility] [phone] (phone) [phone] (fax)  [place]

## 2019-11-01 ENCOUNTER — Other Ambulatory Visit: Payer: Self-pay | Admitting: Physician Assistant

## 2019-11-19 ENCOUNTER — Other Ambulatory Visit: Payer: BC Managed Care – PPO

## 2019-11-19 ENCOUNTER — Encounter: Payer: Self-pay | Admitting: Urology

## 2019-11-19 ENCOUNTER — Encounter
Admission: RE | Admit: 2019-11-19 | Discharge: 2019-11-19 | Disposition: A | Discharge status: Home / Self Care | Payer: BC Managed Care – PPO | Source: Ambulatory Visit | Attending: Urology | Admitting: Urology

## 2019-11-19 ENCOUNTER — Other Ambulatory Visit: Payer: Self-pay

## 2019-11-19 DIAGNOSIS — N138 Other obstructive and reflux uropathy: Secondary | ICD-10-CM | POA: Diagnosis not present

## 2019-11-19 DIAGNOSIS — N401 Enlarged prostate with lower urinary tract symptoms: Secondary | ICD-10-CM

## 2019-11-19 LAB — URINALYSIS, COMPLETE
Bilirubin, UA: NEGATIVE
Glucose, UA: NEGATIVE
Ketones, UA: NEGATIVE
Nitrite, UA: POSITIVE — AB
Protein,UA: NEGATIVE
Specific Gravity, UA: 1.025 (ref 1.005–1.030)
Urobilinogen, Ur: 0.2 mg/dL (ref 0.2–1.0)
pH, UA: 7 (ref 5.0–7.5)

## 2019-11-19 LAB — MICROSCOPIC EXAMINATION: WBC, UA: >30 /hpf — AB (ref 0–5)

## 2019-11-19 NOTE — Patient Instructions (Signed)
Your procedure is scheduled on: Monday 11/29/19.  Report to DAY SURGERY DEPARTMENT LOCATED ON 2ND FLOOR MEDICAL MALL ENTRANCE. To find out your arrival time please call [phone] between 1PM - 3PM on Friday 11/26/19.  Remember: Instructions that are not followed completely may result in serious medical risk, up to and including death, or upon the discretion of your surgeon and anesthesiologist your surgery may need to be rescheduled.      _X__ 1. Do not eat food after midnight the night before your procedure.                 No gum chewing or hard candies. You may drink clear liquids up to 2 hours                 before you are scheduled to arrive for your surgery- DO NOT drink clear                 liquids within 2 hours of the start of your surgery.                 Clear Liquids include:  water, apple juice without pulp, clear carbohydrate                 drink such as Clearfast or Gatorade, Black Coffee or Tea (Do not add                 anything to coffee or tea).    __X__2.  On the morning of surgery brush your teeth with toothpaste and water, you may rinse your mouth with mouthwash if you wish.  Do not swallow any toothpaste or mouthwash.       _X__ 3.  No Alcohol for 24 hours before or after surgery.     _X__ 4.  Do Not Smoke or use e-cigarettes For 24 Hours Prior to Your Surgery.                 Do not use any chewable tobacco products for at least 6 hours prior to                 surgery.   __X__5.  Notify your doctor if there is any change in your medical condition      (cold, fever, infections).      Do not wear jewelry, make-up, hairpins, clips or nail polish. Do not wear lotions, powders, or perfumes.  Do not shave 48 hours prior to surgery. Men may shave face and neck. Do not bring valuables to the hospital.     [facility] is not responsible for any belongings or valuables.   Contacts, dentures/partials or body piercings may not be worn into surgery.  Bring a case for your contacts, glasses or hearing aids, a denture cup will be supplied.    Patients discharged the day of surgery will not be allowed to drive home.    __X__ Take these medicines the morning of surgery with A SIP OF WATER:     1. simvastatin (ZOCOR)  2. tamsulosin (FLOMAX)      __X__ Stop Anti-inflammatories 7 days before surgery such as Advil, Ibuprofen, Motrin, BC or Goodies Powder, Naprosyn, Naproxen, Aleve, Aspirin, Meloxicam. May take Tylenol if needed for pain or discomfort.     __X__ Stop Zinc today and don't start taking any new herbal supplements or vitamins before your procedure.

## 2019-11-20 ENCOUNTER — Encounter: Payer: Self-pay | Admitting: Urology

## 2019-11-20 DIAGNOSIS — N390 Urinary tract infection, site not specified: Secondary | ICD-10-CM | POA: Diagnosis not present

## 2019-11-23 ENCOUNTER — Encounter
Admission: RE | Admit: 2019-11-23 | Discharge: 2019-11-23 | Disposition: A | Discharge status: Home / Self Care | Payer: BC Managed Care – PPO | Source: Ambulatory Visit | Attending: Urology | Admitting: Urology

## 2019-11-23 ENCOUNTER — Other Ambulatory Visit: Payer: Self-pay

## 2019-11-23 DIAGNOSIS — Z01810 Encounter for preprocedural cardiovascular examination: Secondary | ICD-10-CM | POA: Diagnosis not present

## 2019-11-23 DIAGNOSIS — Z01818 Encounter for other preprocedural examination: Secondary | ICD-10-CM | POA: Diagnosis not present

## 2019-11-23 DIAGNOSIS — I10 Essential (primary) hypertension: Secondary | ICD-10-CM | POA: Diagnosis not present

## 2019-11-23 LAB — BASIC METABOLIC PANEL
Anion gap: 8 (ref 5–15)
BUN: 22 mg/dL (ref 8–23)
CO2: 29 mmol/L (ref 22–32)
Calcium: 9.6 mg/dL (ref 8.9–10.3)
Chloride: 102 mmol/L (ref 98–111)
Creatinine, Ser: 1.21 mg/dL (ref 0.61–1.24)
GFR calc Af Amer: >60 mL/min (ref >60)
GFR calc non Af Amer: >60 mL/min (ref >60)
Glucose, Bld: 100 mg/dL — ABNORMAL HIGH (ref 70–99)
Potassium: 3.7 mmol/L (ref 3.5–5.1)
Sodium: 139 mmol/L (ref 135–145)

## 2019-11-25 ENCOUNTER — Other Ambulatory Visit
Admission: RE | Admit: 2019-11-25 | Discharge: 2019-11-25 | Disposition: A | Discharge status: Home / Self Care | Payer: BC Managed Care – PPO | Source: Ambulatory Visit | Attending: Urology | Admitting: Urology

## 2019-11-25 ENCOUNTER — Encounter: Payer: Self-pay | Admitting: Urology

## 2019-11-25 ENCOUNTER — Other Ambulatory Visit: Payer: Self-pay

## 2019-11-25 DIAGNOSIS — Z01812 Encounter for preprocedural laboratory examination: Secondary | ICD-10-CM | POA: Diagnosis not present

## 2019-11-25 DIAGNOSIS — Z20822 Contact with and (suspected) exposure to covid-19: Secondary | ICD-10-CM | POA: Diagnosis not present

## 2019-11-25 LAB — CULTURE, URINE COMPREHENSIVE

## 2019-11-26 LAB — SARS CORONAVIRUS 2 (TAT 6-24 HRS): SARS Coronavirus 2: NEGATIVE

## 2019-11-28 MED ORDER — CIPROFLOXACIN IN D5W 400 MG/200ML IV SOLN
400.0000 mg | INTRAVENOUS | Status: AC
  Administered 2019-11-29: 400 mg via INTRAVENOUS

## 2019-11-29 ENCOUNTER — Ambulatory Visit: Payer: BC Managed Care – PPO | Admitting: Anesthesiology

## 2019-11-29 ENCOUNTER — Encounter: Payer: Self-pay | Admitting: Urology

## 2019-11-29 ENCOUNTER — Encounter
Admission: RE | Disposition: A | Discharge status: Home / Self Care | Payer: Self-pay | Source: Home / Self Care | Attending: Urology

## 2019-11-29 ENCOUNTER — Other Ambulatory Visit: Payer: Self-pay

## 2019-11-29 ENCOUNTER — Telehealth: Payer: Self-pay

## 2019-11-29 ENCOUNTER — Ambulatory Visit
Admission: RE | Admit: 2019-11-29 | Discharge: 2019-11-29 | Disposition: A | Discharge status: Home / Self Care | Payer: BC Managed Care – PPO | Attending: Urology | Admitting: Urology

## 2019-11-29 ENCOUNTER — Emergency Department
Admission: EM | Admit: 2019-11-29 | Discharge: 2019-11-29 | Disposition: A | Discharge status: Home / Self Care | Payer: BC Managed Care – PPO | Source: Home / Self Care | Attending: Emergency Medicine | Admitting: Emergency Medicine

## 2019-11-29 DIAGNOSIS — N451 Epididymitis: Secondary | ICD-10-CM | POA: Insufficient documentation

## 2019-11-29 DIAGNOSIS — R000 Tachycardia, unspecified: Secondary | ICD-10-CM | POA: Diagnosis not present

## 2019-11-29 DIAGNOSIS — N138 Other obstructive and reflux uropathy: Secondary | ICD-10-CM | POA: Insufficient documentation

## 2019-11-29 DIAGNOSIS — N401 Enlarged prostate with lower urinary tract symptoms: Secondary | ICD-10-CM | POA: Insufficient documentation

## 2019-11-29 DIAGNOSIS — N3289 Other specified disorders of bladder: Secondary | ICD-10-CM | POA: Diagnosis not present

## 2019-11-29 DIAGNOSIS — Z79899 Other long term (current) drug therapy: Secondary | ICD-10-CM | POA: Diagnosis not present

## 2019-11-29 DIAGNOSIS — R52 Pain, unspecified: Secondary | ICD-10-CM | POA: Diagnosis not present

## 2019-11-29 DIAGNOSIS — R3914 Feeling of incomplete bladder emptying: Secondary | ICD-10-CM | POA: Insufficient documentation

## 2019-11-29 DIAGNOSIS — I10 Essential (primary) hypertension: Secondary | ICD-10-CM | POA: Diagnosis not present

## 2019-11-29 DIAGNOSIS — E785 Hyperlipidemia, unspecified: Secondary | ICD-10-CM | POA: Diagnosis not present

## 2019-11-29 DIAGNOSIS — N400 Enlarged prostate without lower urinary tract symptoms: Secondary | ICD-10-CM | POA: Diagnosis not present

## 2019-11-29 HISTORY — DX: Benign neoplasm of pituitary gland: D35.2

## 2019-11-29 HISTORY — PX: HOLEP-LASER ENUCLEATION OF THE PROSTATE WITH MORCELLATION: SHX6641

## 2019-11-29 HISTORY — PX: CYSTOSCOPY WITH INSERTION OF UROLIFT: SHX6678

## 2019-11-29 LAB — CBC WITH DIFFERENTIAL/PLATELET
Abs Immature Granulocytes: 0.05 10*3/uL (ref 0.00–0.07)
Basophils Absolute: 0 10*3/uL (ref 0.0–0.1)
Basophils Relative: 0 %
Eosinophils Absolute: 0 10*3/uL (ref 0.0–0.5)
Eosinophils Relative: 0 %
HCT: 39.2 % (ref 39.0–52.0)
Hemoglobin: 14.4 g/dL (ref 13.0–17.0)
Immature Granulocytes: 0 %
Lymphocytes Relative: 6 %
Lymphs Abs: 0.8 10*3/uL (ref 0.7–4.0)
MCH: 30.5 pg (ref 26.0–34.0)
MCHC: 36.7 g/dL — ABNORMAL HIGH (ref 30.0–36.0)
MCV: 83.1 fL (ref 80.0–100.0)
Monocytes Absolute: 0.3 10*3/uL (ref 0.1–1.0)
Monocytes Relative: 2 %
Neutro Abs: 11.6 10*3/uL — ABNORMAL HIGH (ref 1.7–7.7)
Neutrophils Relative %: 92 %
Platelets: 272 10*3/uL (ref 150–400)
RBC: 4.72 MIL/uL (ref 4.22–5.81)
RDW: 11.9 % (ref 11.5–15.5)
WBC: 12.7 10*3/uL — ABNORMAL HIGH (ref 4.0–10.5)
nRBC: 0 % (ref 0.0–0.2)

## 2019-11-29 LAB — BASIC METABOLIC PANEL
Anion gap: 16 — ABNORMAL HIGH (ref 5–15)
BUN: 16 mg/dL (ref 8–23)
CO2: 17 mmol/L — ABNORMAL LOW (ref 22–32)
Calcium: 9.5 mg/dL (ref 8.9–10.3)
Chloride: 104 mmol/L (ref 98–111)
Creatinine, Ser: 1.07 mg/dL (ref 0.61–1.24)
GFR calc Af Amer: >60 mL/min (ref >60)
GFR calc non Af Amer: >60 mL/min (ref >60)
Glucose, Bld: 149 mg/dL — ABNORMAL HIGH (ref 70–99)
Potassium: 3.8 mmol/L (ref 3.5–5.1)
Sodium: 137 mmol/L (ref 135–145)

## 2019-11-29 LAB — URINALYSIS, COMPLETE (UACMP) WITH MICROSCOPIC
Bilirubin Urine: NEGATIVE
Glucose, UA: NEGATIVE mg/dL
Ketones, ur: NEGATIVE mg/dL
Nitrite: NEGATIVE
Protein, ur: 30 mg/dL — AB
Specific Gravity, Urine: 1.002 — ABNORMAL LOW (ref 1.005–1.030)
Squamous Epithelial / HPF: NONE SEEN (ref 0–5)
pH: 8 (ref 5.0–8.0)

## 2019-11-29 SURGERY — CYSTOSCOPY WITH INSERTION OF UROLIFT
Anesthesia: General | Site: Prostate

## 2019-11-29 MED ORDER — OXYCODONE HCL 5 MG/5ML PO SOLN: 5.0000 mg | Freq: Once | ORAL | Status: DC | PRN

## 2019-11-29 MED ORDER — FENTANYL CITRATE (PF) 100 MCG/2ML IJ SOLN
50.0000 ug | Freq: Once | INTRAMUSCULAR | Status: AC
  Administered 2019-11-29: 50 ug via INTRAVENOUS

## 2019-11-29 MED ORDER — ORAL CARE MOUTH RINSE: 15.0000 mL | Freq: Once | OROMUCOSAL | Status: AC

## 2019-11-29 MED ORDER — FENTANYL CITRATE (PF) 100 MCG/2ML IJ SOLN
INTRAMUSCULAR | Status: DC | PRN
  Administered 2019-11-29: 25 ug via INTRAVENOUS
  Administered 2019-11-29: 50 ug via INTRAVENOUS
  Administered 2019-11-29: 25 ug via INTRAVENOUS

## 2019-11-29 MED ORDER — MIDAZOLAM HCL 2 MG/2ML IJ SOLN
INTRAMUSCULAR | Status: DC | PRN
  Administered 2019-11-29: 2 mg via INTRAVENOUS

## 2019-11-29 MED ORDER — LACTATED RINGERS IV SOLN: INTRAVENOUS | Status: DC

## 2019-11-29 MED ORDER — PROPOFOL 10 MG/ML IV BOLUS
INTRAVENOUS | Status: DC | PRN
  Administered 2019-11-29: 180 mg via INTRAVENOUS

## 2019-11-29 MED ORDER — FENTANYL CITRATE (PF) 100 MCG/2ML IJ SOLN
INTRAMUSCULAR | Status: AC
  Administered 2019-11-29: 25 ug via INTRAVENOUS
  Filled 2019-11-29: qty 2

## 2019-11-29 MED ORDER — MORPHINE SULFATE (PF) 4 MG/ML IV SOLN
4.0000 mg | Freq: Once | INTRAVENOUS | Status: AC
  Administered 2019-11-29: 4 mg via INTRAVENOUS
  Filled 2019-11-29: qty 1

## 2019-11-29 MED ORDER — PHENYLEPHRINE HCL (PRESSORS) 10 MG/ML IV SOLN
INTRAVENOUS | Status: DC | PRN
  Administered 2019-11-29 (×3): 100 ug via INTRAVENOUS

## 2019-11-29 MED ORDER — CHLORHEXIDINE GLUCONATE 0.12 % MT SOLN
OROMUCOSAL | Status: AC
  Administered 2019-11-29: 15 mL via OROMUCOSAL
  Filled 2019-11-29: qty 15

## 2019-11-29 MED ORDER — MORPHINE SULFATE (PF) 4 MG/ML IV SOLN
INTRAVENOUS | Status: AC
  Administered 2019-11-29: 4 mg via INTRAVENOUS
  Filled 2019-11-29: qty 1

## 2019-11-29 MED ORDER — BELLADONNA ALKALOIDS-OPIUM 16.2-60 MG RE SUPP
1.0000 | Freq: Once | RECTAL | Status: AC
  Administered 2019-11-29: 1 via RECTAL
  Filled 2019-11-29: qty 1

## 2019-11-29 MED ORDER — ONDANSETRON HCL 4 MG/2ML IJ SOLN
INTRAMUSCULAR | Status: DC | PRN
  Administered 2019-11-29: 4 mg via INTRAVENOUS

## 2019-11-29 MED ORDER — OXYBUTYNIN CHLORIDE 5 MG PO TABS
5.0000 mg | ORAL_TABLET | Freq: Three times a day (TID) | ORAL | 0 refills | Status: DC | PRN
Start: 2019-11-29 — End: 2020-01-12

## 2019-11-29 MED ORDER — FENTANYL CITRATE (PF) 100 MCG/2ML IJ SOLN
INTRAMUSCULAR | Status: AC
  Filled 2019-11-29: qty 2

## 2019-11-29 MED ORDER — FENTANYL CITRATE (PF) 100 MCG/2ML IJ SOLN
25.0000 ug | INTRAMUSCULAR | Status: AC | PRN
  Administered 2019-11-29 (×4): 25 ug via INTRAVENOUS

## 2019-11-29 MED ORDER — MIDAZOLAM HCL 2 MG/2ML IJ SOLN
INTRAMUSCULAR | Status: AC
  Filled 2019-11-29: qty 2

## 2019-11-29 MED ORDER — CHLORHEXIDINE GLUCONATE 0.12 % MT SOLN: 15.0000 mL | Freq: Once | OROMUCOSAL | Status: AC

## 2019-11-29 MED ORDER — DEXAMETHASONE SODIUM PHOSPHATE 10 MG/ML IJ SOLN
INTRAMUSCULAR | Status: DC | PRN
  Administered 2019-11-29: 10 mg via INTRAVENOUS

## 2019-11-29 MED ORDER — FAMOTIDINE 20 MG PO TABS
ORAL_TABLET | ORAL | Status: AC
  Administered 2019-11-29: 20 mg via ORAL
  Filled 2019-11-29: qty 1

## 2019-11-29 MED ORDER — FAMOTIDINE 20 MG PO TABS: 20.0000 mg | ORAL_TABLET | Freq: Once | ORAL | Status: AC

## 2019-11-29 MED ORDER — OXYCODONE HCL 5 MG PO TABS: 5.0000 mg | ORAL_TABLET | Freq: Once | ORAL | Status: DC | PRN

## 2019-11-29 MED ORDER — HYDROCODONE-ACETAMINOPHEN 5-325 MG PO TABS
1.0000 | ORAL_TABLET | Freq: Four times a day (QID) | ORAL | Status: DC | PRN
  Administered 2019-11-29: 1 via ORAL

## 2019-11-29 MED ORDER — ACETAMINOPHEN 500 MG PO TABS
1000.0000 mg | ORAL_TABLET | Freq: Once | ORAL | Status: AC
  Filled 2019-11-29: qty 2

## 2019-11-29 MED ORDER — HYDROCODONE-ACETAMINOPHEN 5-325 MG PO TABS: 1.0000 | ORAL_TABLET | Freq: Four times a day (QID) | ORAL | 0 refills | Status: DC | PRN

## 2019-11-29 MED ORDER — CIPROFLOXACIN IN D5W 400 MG/200ML IV SOLN
INTRAVENOUS | Status: AC
  Filled 2019-11-29: qty 200

## 2019-11-29 MED ORDER — LIDOCAINE HCL (CARDIAC) PF 100 MG/5ML IV SOSY
PREFILLED_SYRINGE | INTRAVENOUS | Status: DC | PRN
  Administered 2019-11-29: 100 mg via INTRAVENOUS

## 2019-11-29 MED ORDER — PROPOFOL 10 MG/ML IV BOLUS
INTRAVENOUS | Status: AC
  Filled 2019-11-29: qty 20

## 2019-11-29 MED ORDER — ACETAMINOPHEN 500 MG PO TABS
ORAL_TABLET | ORAL | Status: AC
  Administered 2019-11-29: 1000 mg via ORAL
  Filled 2019-11-29: qty 2

## 2019-11-29 MED ORDER — HYDROCODONE-ACETAMINOPHEN 5-325 MG PO TABS
ORAL_TABLET | ORAL | Status: AC
  Filled 2019-11-29: qty 1

## 2019-11-29 MED ORDER — ONDANSETRON HCL 4 MG/2ML IJ SOLN: 4.0000 mg | Freq: Once | INTRAMUSCULAR | Status: DC | PRN

## 2019-11-29 SURGICAL SUPPLY — 36 items
ADAPTER IRRIG TUBE 2 SPIKE SOL (ADAPTER) ×6 IMPLANT
BAG DRAIN CYSTO-URO LG1000N (MISCELLANEOUS) ×3 IMPLANT
BAG URINE DRAIN 2000ML AR STRL (UROLOGICAL SUPPLIES) IMPLANT
BAG URO DRAIN 4000ML (MISCELLANEOUS) IMPLANT
CATH FOL 2WAY LX 20X30 (CATHETERS) IMPLANT
CATH FOL 2WAY LX 22X30 (CATHETERS) IMPLANT
CATH FOLEY 3WAY 30CC 22FR (CATHETERS) IMPLANT
CATH URETL 5X70 OPEN END (CATHETERS) ×3 IMPLANT
CONTAINER COLLECT MORCELLATR (MISCELLANEOUS) ×1 IMPLANT
DRAPE 3/4 80X56 (DRAPES) ×3 IMPLANT
DRAPE UTILITY 15X26 TOWEL STRL (DRAPES) IMPLANT
FILTER OVERFLOW MORCELLATOR (FILTER) ×1 IMPLANT
GLOVE BIO SURGEON STRL SZ 6.5 (GLOVE) ×4 IMPLANT
GLOVE BIO SURGEONS STRL SZ 6.5 (GLOVE) ×2
GOWN STRL REUS W/ TWL LRG LVL3 (GOWN DISPOSABLE) ×2 IMPLANT
GOWN STRL REUS W/TWL LRG LVL3 (GOWN DISPOSABLE) ×4
HOLDER FOLEY CATH W/STRAP (MISCELLANEOUS) ×3 IMPLANT
KIT TURNOVER CYSTO (KITS) ×3 IMPLANT
LASER FIBER 550M SMARTSCOPE (Laser) ×3 IMPLANT
MBRN O SEALING YLW 17 FOR INST (MISCELLANEOUS) ×3
MEMBRANE SLNG YLW 17 FOR INST (MISCELLANEOUS) ×1 IMPLANT
MORCELLATOR COLLECT CONTAINER (MISCELLANEOUS) ×3
MORCELLATOR OVERFLOW FILTER (FILTER) ×3
MORCELLATOR ROTATION 4.75 335 (MISCELLANEOUS) IMPLANT
PACK CYSTO AR (MISCELLANEOUS) ×3 IMPLANT
SET CYSTO W/LG BORE CLAMP LF (SET/KITS/TRAYS/PACK) ×3 IMPLANT
SET IRRIG Y TYPE TUR BLADDER L (SET/KITS/TRAYS/PACK) ×3 IMPLANT
SLEEVE PROTECTION STRL DISP (MISCELLANEOUS) ×6 IMPLANT
SOL .9 NS 3000ML IRR  AL (IV SOLUTION) ×8
SOL .9 NS 3000ML IRR UROMATIC (IV SOLUTION) ×4 IMPLANT
SURGILUBE 2OZ TUBE FLIPTOP (MISCELLANEOUS) IMPLANT
SYRINGE IRR TOOMEY STRL 70CC (SYRINGE) ×3 IMPLANT
SYSTEM UROLIFT (Male Continence) ×18 IMPLANT
TUBE PUMP MORCELLATOR PIRANHA (TUBING) ×3 IMPLANT
WATER STERILE IRR 1000ML POUR (IV SOLUTION) ×3 IMPLANT
WATER STERILE IRR 3000ML UROMA (IV SOLUTION) ×3 IMPLANT

## 2019-11-29 NOTE — Op Note (Signed)
Preoperative diagnosis: BPH with obstructive symptomatology   Postoperative diagnosis: BPH with obstructive symptomatology   Principal procedure: Urolift procedure, with the placement of 6 implants; holmium laser enucleation of median lobe of prostate.   Surgeon: Hollice Espy   Anesthesia: LMA   Complications: None   Drains: None   Estimated blood loss: < 5 mL   Indications: 64 year-old male with obstructive symptomatology secondary to BPH.  The patient's symptoms have progressed, and he has requested further management.  Management options including TURP with resection/ablation of the prostate as well as Urolift were discussed.  The patient has chosen to have a Urolift procedure.  He does have a small intravesical well-circumscribed median lobe which we discussed enucleating is a hybrid type procedure. He has been instructed to the procedure as well as risks and complications which include but are not limited to infection, bleeding, and inadequate treatment with the Urolift procedure alone, anesthetic complications, among others.  He understands these and desires to proceed.   Findings: Very small well-circumscribed intravesical median lobe enucleated with minimal bladder neck bleeding.  Bladder unremarkable.  Wide open fossa with anterior channel following placement of 6 implants.   Description of procedure: The patient was properly identified in the holding area.  He received preoperative IV antibiotics.  He was taken to the operating room where general anesthetic was administered with the LMA.  He is placed in the dorsolithotomy position.  Genitalia and perineum were prepped and draped.  Proper timeout was performed.  Male sounds were used to gently dilate the urethral meatus.  A 26 French resectoscope was then placed using a blunt obturator which went easily.  The bladder was inspected.  Very small well-circumscribed median lobe protruding into the bladder was appreciated.  The bladder  itself was mildly trabeculated.  The UOs were good distance from the bladder neck itself.  At this point in time, a 20 m laser fiber was used using enucleation settings of 1.9 J and 53 Hz to make 2 incisions at the 5:00 and 7:00 positions meeting in the midline and enucleating the small median lobe and a caudal to cranial direction ~was ultimately able to be cleaved off the bladder neck.  Hemostatic settings were then used to fulgurate all bleeding blood vessels at the bladder neck.  The small median lobe was grasped using graspers and able to be pulled out of the urethra en bloc without morcellation.   A 60F cystoscope was inserted into the bladder with findings as described above.  The 1st pair of implants were placed at the bladder neck ~1.5 cm from the bladder Neck. The 2nd pair of implants were placed at the level of the verumontanum.   A repeat cysto was performed and another pair of implants in between the prior pairs.    A final cystoscopy was conducted first to inspect the location and state of each implant and second, to confirm the presence of a continuous anterior channel was present through the prostatic urethra with irrigation flow turned off.    Six implants were delivered in total.   Due to the enucleation, did elect to place a Foley catheter, 20 [place] with a 30 cc balloon using standard sterile technique.   Following this, the scope was removed.  After anesthetic reversal he was transported to the PACU in stable condition.  He tolerated the procedure well.   Plan: He will follow-up with me in 4 to 6 weeks for IPSS/PVR.  Foley to be removed tomorrow morning.  He will be taught how to do this himself or may come to the office to have his catheter removed depending on his comfort level.

## 2019-11-29 NOTE — ED Notes (Signed)
Pt reports pain is back. New order received.

## 2019-11-29 NOTE — Anesthesia Preprocedure Evaluation (Addendum)
Anesthesia Evaluation  Patient identified by MRN, date of birth, ID band Patient awake    Reviewed: Allergy & Precautions, H&P , NPO status , Patient's Chart, lab work & pertinent test results  Airway Mallampati: II  TM Distance: >3 FB Neck ROM: full    Dental  (+) Chipped   Pulmonary neg pulmonary ROS,    Pulmonary exam normal        Cardiovascular hypertension, (-) angina(-) Past MI and (-) Cardiac Stents Normal cardiovascular exam(-) dysrhythmias      Neuro/Psych negative neurological ROS  negative psych ROS   GI/Hepatic negative GI ROS, Neg liver ROS,   Endo/Other  negative endocrine ROS  Renal/GU      Musculoskeletal   Abdominal   Peds  Hematology negative hematology ROS (+)   Anesthesia Other Findings Past Medical History: No date: Allergy No date: Hyperlipidemia No date: Hypertension  Past Surgical History: No date: CATARACT EXTRACTION 04/13/2019: COLONOSCOPY WITH PROPOFOL; N/A     Comment:  Procedure: COLONOSCOPY WITH PROPOFOL;  Surgeon: Lucilla Lame, MD;  Location: [facility] ENDOSCOPY;  Service:               Endoscopy;  Laterality: N/A; No date: PITUITARY EXCISION No date: TONSILLECTOMY  BMI    Body Mass Index: 27.25 kg/m      Reproductive/Obstetrics negative OB ROS                            Anesthesia Physical Anesthesia Plan  ASA: II  Anesthesia Plan: General LMA   Post-op Pain Management:    Induction:   PONV Risk Score and Plan: Ondansetron, Dexamethasone and Treatment may vary due to age or medical condition  Airway Management Planned:   Additional Equipment:   Intra-op Plan:   Post-operative Plan:   Informed Consent: I have reviewed the patients History and Physical, chart, labs and discussed the procedure including the risks, benefits and alternatives for the proposed anesthesia with the patient or authorized representative who has  indicated his/her understanding and acceptance.     Dental Advisory Given  Plan Discussed with: Anesthesiologist, CRNA and Surgeon  Anesthesia Plan Comments:        Anesthesia Quick Evaluation

## 2019-11-29 NOTE — Discharge Instructions (Signed)
Indwelling Urinary Catheter Care, Adult An indwelling urinary catheter is a thin tube that is put into your bladder. The tube helps to drain pee (urine) out of your body. The tube goes in through your urethra. Your urethra is where pee comes out of your body. Your pee will come out through the catheter, then it will go into a bag (drainage bag). Take good care of your catheter so it will work well. How to wear your catheter and bag Supplies needed  Sticky tape (adhesive tape) or a leg strap.  Alcohol wipe or soap and water (if you use tape).  A clean towel (if you use tape).  Large overnight bag.  Smaller bag (leg bag). Wearing your catheter Attach your catheter to your leg with tape or a leg strap.  Make sure the catheter is not pulled tight.  If a leg strap gets wet, take it off and put on a dry strap.  If you use tape to hold the bag on your leg: 1. Use an alcohol wipe or soap and water to wash your skin where the tape made it sticky before. 2. Use a clean towel to pat-dry that skin. 3. Use new tape to make the bag stay on your leg. Wearing your bags You should have been given a large overnight bag.  You may wear the overnight bag in the day or night.  Always have the overnight bag lower than your bladder.  Do not let the bag touch the floor.  Before you go to sleep, put a clean plastic bag in a wastebasket. Then hang the overnight bag inside the wastebasket. You should also have a smaller leg bag that fits under your clothes.  Always wear the leg bag below your knee.  Do not wear your leg bag at night. How to care for your skin and catheter Supplies needed  A clean washcloth.  Water and mild soap.  A clean towel. Caring for your skin and catheter      Clean the skin around your catheter every day: 1. Wash your hands with soap and water. 2. Wet a clean washcloth in warm water and mild soap. 3. Clean the skin around your urethra.  If you are  male:  Gently spread the folds of skin around your vagina (labia).  With the washcloth in your other hand, wipe the inner side of your labia on each side. Wipe from front to back.  If you are male:  Pull back any skin that covers the end of your penis (foreskin).  With the washcloth in your other hand, wipe your penis in small circles. Start wiping at the tip of your penis, then move away from the catheter.  Move the foreskin back in place, if needed. 4. With your free hand, hold the catheter close to where it goes into your body.  Keep holding the catheter during cleaning so it does not get pulled out. 5. With the washcloth in your other hand, clean the catheter.  Only wipe downward on the catheter.  Do not wipe upward toward your body. Doing this may push germs into your urethra and cause infection. 6. Use a clean towel to pat-dry the catheter and the skin around it. Make sure to wipe off all soap. 7. Wash your hands with soap and water.  Shower every day. Do not take baths.  Do not use cream, ointment, or lotion on the area where the catheter goes into your body, unless your doctor tells you   to.  Do not use powders, sprays, or lotions on your genital area.  Check your skin around the catheter every day for signs of infection. Check for: ? Redness, swelling, or pain. ? Fluid or blood. ? Warmth. ? Pus or a bad smell. How to empty the bag Supplies needed  Rubbing alcohol.  Gauze pad or cotton ball.  Tape or a leg strap. Emptying the bag Pour the pee out of your bag when it is ?- full, or at least 2-3 times a day. Do this for your overnight bag and your leg bag. 1. Wash your hands with soap and water. 2. Separate (detach) the bag from your leg. 3. Hold the bag over the toilet or a clean pail. Keep the bag lower than your hips and bladder. This is so the pee (urine) does not go back into the tube. 4. Open the pour spout. It is at the bottom of the bag. 5. Empty the  pee into the toilet or pail. Do not let the pour spout touch any surface. 6. Put rubbing alcohol on a gauze pad or cotton ball. 7. Use the gauze pad or cotton ball to clean the pour spout. 8. Close the pour spout. 9. Attach the bag to your leg with tape or a leg strap. 10. Wash your hands with soap and water. Follow instructions for cleaning the drainage bag:  From the product maker.  As told by your doctor. How to change the bag Supplies needed  Alcohol wipes.  A clean bag.  Tape or a leg strap. Changing the bag Replace your bag when it starts to leak, smell bad, or look dirty. 1. Wash your hands with soap and water. 2. Separate the dirty bag from your leg. 3. Pinch the catheter with your fingers so that pee does not spill out. 4. Separate the catheter tube from the bag tube where these tubes connect (at the connection valve). Do not let the tubes touch any surface. 5. Clean the end of the catheter tube with an alcohol wipe. Use a different alcohol wipe to clean the end of the bag tube. 6. Connect the catheter tube to the tube of the clean bag. 7. Attach the clean bag to your leg with tape or a leg strap. Do not make the bag tight on your leg. 8. Wash your hands with soap and water. General rules   Never pull on your catheter. Never try to take it out. Doing that can hurt you.  Always wash your hands before and after you touch your catheter or bag. Use a mild, fragrance-free soap. If you do not have soap and water, use hand sanitizer.  Always make sure there are no twists or bends (kinks) in the catheter tube.  Always make sure there are no leaks in the catheter or bag.  Drink enough fluid to keep your pee pale yellow.  Do not take baths, swim, or use a hot tub.  If you are male, wipe from front to back after you poop (have a bowel movement). Contact a doctor if:  Your pee is cloudy.  Your pee smells worse than usual.  Your catheter gets clogged.  Your catheter  leaks.  Your bladder feels full. Get help right away if:  You have redness, swelling, or pain where the catheter goes into your body.  You have fluid, blood, pus, or a bad smell coming from the area where the catheter goes into your body.  Your skin feels warm where   the catheter goes into your body.  You have a fever.  You have pain in your: ? Belly (abdomen). ? Legs. ? Lower back. ? Bladder.  You see blood in the catheter.  Your pee is pink or red.  You feel sick to your stomach (nauseous).  You throw up (vomit).  You have chills.  Your pee is not draining into the bag.  Your catheter gets pulled out. Summary  An indwelling urinary catheter is a thin tube that is placed into the bladder to help drain pee (urine) out of the body.  The catheter is placed into the part of the body that drains pee from the bladder (urethra).  Taking good care of your catheter will keep it working properly and help prevent problems.  Always wash your hands before and after touching your catheter or bag.  Never pull on your catheter or try to take it out. This information is not intended to replace advice given to you by your health care provider. Make sure you discuss any questions you have with your health care provider. Document Revised: 09/25/2018 Document Reviewed: 01/17/2017 Elsevier Patient Education  2020 [place] [facility]ative Instructions     Patient Expectations   1. Mild blood in your urine for about 1 week.  2. Urinary buring, frequency, and urgency for 10 days.  3. Mild pelvic pain 1-2 weeks.     Return to Activity     1. Drink water post procedure.  2. Take meds as needed.  Tylenol and/or Motrin is most helpful.  You may also by Pyridium/Azo over-the-counter for urinary burning.  3. No lifting or straining 48hrs.  4. Other activity when they feel up to it.  5.  You may stop taking Flomax about a week after surgery.  Continue  finasteride until your follow-up and will discuss when to stop this medication.   AMBULATORY SURGERY  DISCHARGE INSTRUCTIONS   1) The drugs that you were given will stay in your system until tomorrow so for the next 24 hours you should not:  A) Drive an automobile B) Make any legal decisions C) Drink any alcoholic beverage   2) You may resume regular meals tomorrow.  Today it is better to start with liquids and gradually work up to solid foods.  You may eat anything you prefer, but it is better to start with liquids, then soup and crackers, and gradually work up to solid foods.   3) Please notify your doctor immediately if you have any unusual bleeding, trouble breathing, redness and pain at the surgery site, drainage, fever, or pain not relieved by medication.    4) Additional Instructions:        Please contact your physician with any problems or Same Day Surgery at [phone], Monday through Friday 6 am to 4 pm, or [place] at [facility] number at [phone].

## 2019-11-29 NOTE — Anesthesia Procedure Notes (Signed)
Procedure Name: LMA Insertion Date/Time: 11/29/2019 8:04 AM Performed by: Nelda Marseille, CRNA Pre-anesthesia Checklist: Patient identified, Patient being monitored, Timeout performed, Emergency Drugs available and Suction available Patient Re-evaluated:Patient Re-evaluated prior to induction Oxygen Delivery Method: Circle system utilized Preoxygenation: Pre-oxygenation with 100% oxygen Induction Type: IV induction Ventilation: Mask ventilation without difficulty LMA: LMA inserted LMA Size: 4.0 Tube type: Oral Number of attempts: 1 Placement Confirmation: positive ETCO2 and breath sounds checked- equal and bilateral Tube secured with: Tape Dental Injury: Teeth and Oropharynx as per pre-operative assessment

## 2019-11-29 NOTE — Transfer of Care (Signed)
Immediate Anesthesia Transfer of Care Note  Patient: Hunter Bachar  Procedure(s) Performed: CYSTOSCOPY WITH INSERTION OF UROLIFT (N/A Prostate) HOLEP-LASER ENUCLEATION OF THE PROSTATE (N/A Prostate)  Patient Location: PACU  Anesthesia Type:General  Level of Consciousness: sedated  Airway & Oxygen Therapy: Patient Spontanous Breathing and Patient connected to face mask oxygen  Post-op Assessment: Report given to RN and Post -op Vital signs reviewed and stable  Post vital signs: Reviewed and stable  Last Vitals:  Vitals Value Taken Time  BP 96/67 11/29/19 0843  Temp    Pulse 60 11/29/19 0845  Resp 14 11/29/19 0845  SpO2 98 % 11/29/19 0845  Vitals shown include unvalidated device data.  Last Pain:  Vitals:   11/29/19 0635  TempSrc: Tympanic  PainSc: 5       Patients Stated Pain Goal: 0 (76/72/09 4709)  Complications: No complications documented.

## 2019-11-29 NOTE — ED Triage Notes (Signed)
Pt to the er for bladder spasms post op surgery today. Pt has an indwelling catheter in place at this time with orange colored urine. Pt received 200mg  Fentanyl with relief. !8 in right FA. Pt BP 143/98, heart rate 94, resp 20, 98% on room air. CBG 154.

## 2019-11-29 NOTE — Progress Notes (Signed)
Teaching Foley care and d/c catheter at home done by Silva Bandy, Luther and this RN. Pt and spouse verbalize understanding. PT informed to call office if unable to remove or uncomfortable doing so at home tomorrow.

## 2019-11-29 NOTE — Telephone Encounter (Signed)
Patient called complaining of bladder spasms post op HOLEP today. Patient was encouraged to utilize oxybutinin and pain medication. He states he had not yet filled from pharmacy. He denies fever, chills, nausea or vomiting and confirms urine is draining from the bag.

## 2019-11-29 NOTE — H&P (Signed)
Updated 11/29/19 without change [facility] CTAB   [name] 08-Nov-1955 [id number]  Referring provider: Trinna Post, [name] [address] [place],  [place] [address]     Chief Complaint  Patient presents with  . Benign Prostatic Hypertrophy    HPI: Brett Wilson is a 64 y.o. M with hx of testicular pain and urinary symptoms returns today for a 6 month f/u.   Currently on maximum medical therapy of finasteride and flomax.   Returned to clinic on 08/25/19 c/o of worsened testicular pain within last 2 weeks. He described the pain as constant, 6/10 dull ache limited to right hemiscrotum and slightly exacerbated with palpation.   He was followed up with a Scrotal US which indicated small bilateral hydroceles and small right scrotal pearl.   He was treated with Abx and pain resolved in 3 days.   He reports of frequency, difficulty urinating and weak stream. He is currently on Flomax and finasteride with refractory symptoms. PVR 91 mL.   He is interested in Urolift procedure.          IPSS           Row Name 09/22/19 1100               International Prostate Symptom Score    How often have you had the sensation of not emptying your bladder?  Less than half the time      How often have you had to urinate less than every two hours?  Less than half the time      How often have you found you stopped and started again several times when you urinated?  About half the time      How often have you found it difficult to postpone urination?  Less than 1 in 5 times      How often have you had a weak urinary stream?  About half the time      How often have you had to strain to start urination?  About half the time      How many times did you typically get up at night to urinate?  2 Times      Total IPSS Score  16             Quality of Life due to urinary symptoms    If you were to spend the rest of your life with your urinary  condition just the way it is now how would you feel about that?  Mostly Disatisfied         Score:  1-7 Mild 8-19 Moderate 20-35 Severe  PMH:     Past Medical History:  Diagnosis Date  . Allergy   . Hyperlipidemia   . Hypertension     Surgical History: Past Surgical History:  Procedure Laterality Date  . CATARACT EXTRACTION    . COLONOSCOPY WITH PROPOFOL N/A 04/13/2019   Procedure: COLONOSCOPY WITH PROPOFOL;  Surgeon: Lucilla Lame, MD;  Location: [facility] ENDOSCOPY;  Service: Endoscopy;  Laterality: N/A;  . PITUITARY EXCISION    . TONSILLECTOMY      Home Medications:       Allergies as of 09/22/2019      Reactions   Penicillins Hives         Medication List       Accurate as of September 22, 2019 11:59 PM. If you have any questions, ask your nurse or doctor.        cyclobenzaprine 5 MG  tablet Commonly known as: FLEXERIL Take 1 tablet (5 mg total) by mouth at bedtime.   finasteride 5 MG tablet Commonly known as: PROSCAR Take 1 tablet (5 mg total) by mouth daily.   hydrochlorothiazide 12.5 MG capsule Commonly known as: MICROZIDE Take 1 capsule (12.5 mg total) by mouth daily.   losartan 100 MG tablet Commonly known as: COZAAR Take 1 tablet (100 mg total) by mouth daily.   MULTIVITAMIN PO Take by mouth daily.   naproxen 500 MG tablet Commonly known as: Naprosyn Take 1 tablet (500 mg total) by mouth daily as needed.   simvastatin 40 MG tablet Commonly known as: ZOCOR Take 1 tablet (40 mg total) by mouth daily.   tamsulosin 0.4 MG Caps capsule Commonly known as: FLOMAX TAKE 1 CAPSULE BY MOUTH EVERY DAY   ZINC PO Take by mouth daily.       Allergies:      Allergies  Allergen Reactions  . Penicillins Hives    Family History:      Family History  Problem Relation Age of Onset  . Hypertension Mother   . Colon cancer Father     Social History:  reports that he has never smoked. He has never used  smokeless tobacco. He reports current alcohol use. He reports that he does not use drugs.   Physical Exam: BP (!) 160/92   Pulse 84   Ht 5\' 7"  (1.702 m)   Wt 174 lb (78.9 kg)   BMI 27.25 kg/m   Constitutional:  Alert and oriented, No acute distress. HEENT: [place] AT, moist mucus membranes.  Trachea midline, no masses. Cardiovascular: No clubbing, cyanosis, or edema. Respiratory: Normal respiratory effort, no increased work of breathing. Skin: No rashes, bruises or suspicious lesions. Neurologic: Grossly intact, no focal deficits, moving all 4 extremities. Psychiatric: Normal mood and affect.  Laboratory Data: Recent Labs       Lab Results  Component Value Date   CREATININE 1.02 09/01/2019     Pertinent Imaging:      Results for orders placed or performed in visit on 09/22/19  BLADDER SCAN AMB NON-IMAGING  Result Value Ref Range   Scan Result 91 ML    CLINICAL DATA: Acute on chronic right testicular pain  EXAM: SCROTAL ULTRASOUND  DOPPLER ULTRASOUND OF THE TESTICLES  TECHNIQUE: Complete ultrasound examination of the testicles, epididymis, and other scrotal structures was performed. Color and spectral Doppler ultrasound were also utilized to evaluate blood flow to the testicles.  COMPARISON: None.  FINDINGS: Right testicle  Measurements: 5.0 x 2.4 x 3.2 cm. Homogeneous echogenicity. Normal blood flow. No mass or microlithiasis visualized. Small scrotal pearl in the inferiorly, typically incidental.  Left testicle  Measurements: 5.3 x 2.4 x 3.2 cm. Homogeneous echogenicity. Normal blood flow. No mass or microlithiasis visualized.  Right epididymis: Normal in size and appearance.  Left epididymis: Normal in size and appearance.  Hydrocele: Small bilateral.  Varicocele: None visualized.  Pulsed Doppler interrogation of both testes demonstrates normal low resistance arterial and venous waveforms bilaterally.  No hernia  demonstrated sonographically in the right inguinal region.  IMPRESSION: 1. Small bilateral hydroceles. 2. Unremarkable sonographic appearance of both testis with normal blood flow. 3. Small right scrotal pearl, typically incidental.   Electronically Signed By: Keith Rake M.D. On: 08/25/2019 16:22  I have personally reviewed the images and agree with radiologist interpretation.   Assessment & Plan:    1. BPH w/ urinary obstruction   Maximum medical management with finasteride and flomax with  refractory symptoms   Interested in [place] for obstruction  Presence of small medium lobe makes Urolift more complicated and may not adequately address median lobe. Recommended addressing medium lobe separately through TUR vs. holmium laser enucleation followed by Urolift clip placement if desires Urolift as hybrid procedure.  Alternatively standard TURP could also be considered.    Risks and benefits of both discussed.    Risk including bleeding, infection, damage surrounding structures, retrograde ejaculation, incontinence, amongst others were all reviewed.  He is interested in laser enucleation of the small median lobe which can likely be evacuated from the bladder using forceps rather than morcellation along with UroLift as a hybrid type procedure.  He understood the risk and benefits including post procedural foley catheter use.    2. Epididymitis   Resolved  3. Hx of incomplete bladder emptying Previously noted to have minimally elevated PVR of 114 mL, remains slightly elevated today.   Schedule TRUS/ [facility] [address], [place], [place] [address] [phone]  I, Lucas Mallow, am acting as a scribe for Dr. Hollice Espy,  I have reviewed the above documentation for accuracy and completeness, and I agree with the above.   Hollice Espy, MD

## 2019-11-29 NOTE — ED Provider Notes (Signed)
[facility] Emergency Department Provider Note   ____________________________________________   First MD Initiated Contact with Patient 11/29/19 2030     (approximate)  I have reviewed the triage vital signs and the nursing notes.   HISTORY  Chief Complaint Abdominal Pain    HPI Brett Wilson is a 64 y.o. male with past medical history of hypertension, hyperlipidemia, and BPH who presents to the ED complaining of abdominal pain.  Patient had UroLift and laser enucleation of median lobe of prostate performed earlier today with Dr. Erlene Quan.  He states he was doing well at the time of discharge but has developed increasing abdominal pain with what feels like bladder spasms throughout the day today.  Spasms initially seem to be intermittent but have now become constant later in the day, not alleviated by the oxycodone or oxybutynin he has been taking at home.  He states the urine seems to have been flowing normally through his Foley catheter and blood has gradually cleared up with increasingly lighter pink-tinged urine.  He denies any flank pain and has not had any fevers or vomiting.  He was unable to tolerate the pain at home and so called EMS.        Past Medical History:  Diagnosis Date  . Allergy   . Hyperlipidemia   . Hypertension   . Pituitary adenoma [facility])     Patient Active Problem List   Diagnosis Date Noted  . Family history of malignant neoplasm of gastrointestinal tract   . Polyp of descending colon   . Benign prostatic hyperplasia with weak urinary stream 02/25/2019  . Arthritis 02/25/2019  . Testicular pain 02/25/2019  . Hyperlipidemia 02/25/2019  . Essential hypertension 02/25/2019    Past Surgical History:  Procedure Laterality Date  . CATARACT EXTRACTION    . COLONOSCOPY WITH PROPOFOL N/A 04/13/2019   Procedure: COLONOSCOPY WITH PROPOFOL;  Surgeon: Lucilla Lame, MD;  Location: [facility] ENDOSCOPY;  Service: Endoscopy;  Laterality: N/A;    . CYSTOSCOPY WITH INSERTION OF UROLIFT    . PITUITARY EXCISION    . TONSILLECTOMY      Prior to Admission medications   Medication Sig Start Date End Date Taking? Authorizing Provider  cyclobenzaprine (FLEXERIL) 5 MG tablet Take 1 tablet (5 mg total) by mouth at bedtime. Patient not taking: Reported on 11/10/2019 06/24/19   Trinna Post, [name]  erythromycin ophthalmic ointment Place 1 application into the right eye at bedtime. Patient not taking: Reported on 11/10/2019 10/27/19   Trinna Post, [name]  finasteride (PROSCAR) 5 MG tablet Take 1 tablet (5 mg total) by mouth daily. Patient taking differently: Take 5 mg by mouth at bedtime.  03/24/19   Hollice Espy, MD  hydrochlorothiazide (MICROZIDE) 12.5 MG capsule Take 1 capsule (12.5 mg total) by mouth daily. 06/24/19 12/21/19  Trinna Post, [name]  HYDROcodone-acetaminophen (NORCO/VICODIN) 5-325 MG tablet Take 1-2 tablets by mouth every 6 (six) hours as needed for moderate pain. 11/29/19   Hollice Espy, MD  losartan (COZAAR) 100 MG tablet Take 1 tablet (100 mg total) by mouth daily. 06/24/19 12/21/19  Trinna Post, [name]  Multiple Vitamin (MULTIVITAMIN WITH MINERALS) TABS tablet Take 1 tablet by mouth daily.    [provider]  naproxen (NAPROSYN) 500 MG tablet Take 1 tablet (500 mg total) by mouth daily as needed. Patient taking differently: Take 500 mg by mouth daily as needed (pain.).  02/24/19   Trinna Post, [name]  oxybutynin (DITROPAN) 5 MG tablet Take 1  tablet (5 mg total) by mouth every 8 (eight) hours as needed for bladder spasms. 11/29/19   Hollice Espy, MD  simvastatin (ZOCOR) 40 MG tablet TAKE 1 TABLET BY MOUTH EVERY DAY Patient taking differently: Take 40 mg by mouth daily.  11/01/19   Trinna Post, [name]  tamsulosin (FLOMAX) 0.4 MG CAPS capsule TAKE 1 CAPSULE BY MOUTH EVERY DAY Patient taking differently: Take 0.4 mg by mouth daily.  08/17/19   Trinna Post, [name]  Zinc 50 MG TABS Take 50 mg by mouth  daily.    [provider]    Allergies Penicillins  Family History  Problem Relation Age of Onset  . Hypertension Mother   . Colon cancer Father     Social History Social History   Tobacco Use  . Smoking status: Never Smoker  . Smokeless tobacco: Never Used  Vaping Use  . Vaping Use: Never used  Substance Use Topics  . Alcohol use: Yes  . Drug use: Never    Review of Systems  Constitutional: No fever/chills Eyes: No visual changes. ENT: No sore throat. Cardiovascular: Denies chest pain. Respiratory: Denies shortness of breath. Gastrointestinal: Positive for abdominal pain.  No nausea, no vomiting.  No diarrhea.  No constipation. Genitourinary: Negative for dysuria. Musculoskeletal: Negative for back pain. Skin: Negative for rash. Neurological: Negative for headaches, focal weakness or numbness.  ____________________________________________   PHYSICAL EXAM:  VITAL SIGNS: ED Triage Vitals  Enc Vitals Group     BP      Pulse      Resp      Temp      Temp src      SpO2      Weight      Height      Head Circumference      Peak Flow      Pain Score      Pain Loc      Pain Edu?      Excl. in [place]?     Constitutional: Alert and oriented. Eyes: Conjunctivae are normal. Head: Atraumatic. Nose: No congestion/rhinnorhea. Mouth/Throat: Mucous membranes are moist. Neck: Normal ROM Cardiovascular: Normal rate, regular rhythm. Grossly normal heart sounds. Respiratory: Normal respiratory effort.  No retractions. Lungs CTAB. Gastrointestinal: Soft and tender to palpation in suprapubic area, no CVA tenderness bilaterally. No distention. Genitourinary: Foley catheter in place with scant bleeding from urethral meatus, large amount of pink-tinged urine in collecting bag.  No clots noted. Musculoskeletal: No lower extremity tenderness nor edema. Neurologic:  Normal speech and language. No gross focal neurologic deficits are appreciated. Skin:  Skin is warm,  dry and intact. No rash noted. Psychiatric: Mood and affect are normal. Speech and behavior are normal.  ____________________________________________   LABS (all labs ordered are listed, but only abnormal results are displayed)  Labs Reviewed  BASIC METABOLIC PANEL - Abnormal; Notable for the following components:      Result Value   CO2 17 (*)    Glucose, Bld 149 (*)    Anion gap 16 (*)    All other components within normal limits  CBC WITH DIFFERENTIAL/PLATELET - Abnormal; Notable for the following components:   WBC 12.7 (*)    MCHC 36.7 (*)    Neutro Abs 11.6 (*)    All other components within normal limits  URINALYSIS, COMPLETE (UACMP) WITH MICROSCOPIC - Abnormal; Notable for the following components:   Color, Urine AMBER (*)    APPearance CLEAR (*)    Specific Gravity, Urine  1.002 (*)    Hgb urine dipstick LARGE (*)    Protein, ur 30 (*)    Leukocytes,Ua TRACE (*)    Bacteria, UA RARE (*)    All other components within normal limits   ____________________________________________  EKG  ED ECG REPORT I, Blake Divine, the attending physician, personally viewed and interpreted this ECG.   Date: 11/29/2019  EKG Time: 20:43  Rate: 87  Rhythm: normal sinus rhythm  Axis: Normal  Intervals:none  ST&T Change: None   PROCEDURES  Procedure(s) performed (including Critical Care):  Procedures   ____________________________________________   INITIAL IMPRESSION / ASSESSMENT AND PLAN / ED COURSE       64 year old male with history of hypertension, hyperlipidemia, and BPH status post UroLift and laser enucleation of prostate performed earlier today presents to the ED for increasing abdominal pain and what feels like bladder spasms.  His Foley catheter seems to be working appropriately with urine flowing and a large amount of urine collecting in the bag.  Bladder scan showed no evidence of urinary retention.  We will treat his pain with IV morphine, check labs  including kidney function, check UA.  Plan to discuss with urology.  Lab work is unremarkable, no drop in H&H and renal function is stable.  Case discussed with Dr. Erlene Quan of urology, low suspicion for complication related to procedure and CT scan is not indicated at this time.  Dr. Erlene Quan recommends trial of B&O suppository for bladder spasm, will reevaluate afterwards.  Patient reports pain is much improved following B&O suppository and he is now appropriate for discharge home.  He was counseled to follow-up with urology as previously scheduled and to remove Foley catheter as directed by urology.  He may take oxycodone and oxybutynin as needed for pain, counseled to return to the ED for new or worsening symptoms.  Patient agrees with plan.      ____________________________________________   FINAL CLINICAL IMPRESSION(S) / ED DIAGNOSES  Final diagnoses:  Bladder spasm     ED Discharge Orders    None       Note:  This document was prepared using Dragon voice recognition software and may include unintentional dictation errors.   Blake Divine, MD 11/29/19 [phone]

## 2019-11-30 ENCOUNTER — Encounter: Payer: Self-pay | Admitting: Urology

## 2019-11-30 ENCOUNTER — Telehealth: Payer: Self-pay

## 2019-11-30 ENCOUNTER — Encounter: Payer: Self-pay | Admitting: Physician Assistant

## 2019-11-30 LAB — SURGICAL PATHOLOGY

## 2019-11-30 NOTE — Anesthesia Postprocedure Evaluation (Signed)
Anesthesia Post Note  Patient: Creston Klas  Procedure(s) Performed: CYSTOSCOPY WITH INSERTION OF UROLIFT (N/A Prostate) HOLEP-LASER ENUCLEATION OF THE PROSTATE (N/A Prostate)  Patient location during evaluation: PACU Anesthesia Type: General Level of consciousness: awake and alert Pain management: pain level controlled Vital Signs Assessment: post-procedure vital signs reviewed and stable Respiratory status: spontaneous breathing, nonlabored ventilation and respiratory function stable Cardiovascular status: blood pressure returned to baseline and stable Postop Assessment: no apparent nausea or vomiting Anesthetic complications: no   No complications documented.   Last Vitals:  Vitals:   11/29/19 1005 11/29/19 1010  BP: 125/62 (!) 154/79  Pulse: 72 74  Resp: (!) 7 16  Temp:  (!) 36.1 C  SpO2: 92% 99%    Last Pain:  Vitals:   11/30/19 0958  TempSrc:   PainSc: 0-No pain                 Tera Mater

## 2019-11-30 NOTE — Telephone Encounter (Signed)
Contacted patient per Dr. Erlene Quan to follow up with patient post operatively. Patient was instructed to remove his foley this morning, he states he did this with out difficulty and is doing much better with it removed. He is urinating well on his own and bladder spasms have stopped. Patient was told to keep follow up in 6wks

## 2019-12-15 ENCOUNTER — Other Ambulatory Visit: Payer: Self-pay | Admitting: Physician Assistant

## 2019-12-15 DIAGNOSIS — I10 Essential (primary) hypertension: Secondary | ICD-10-CM

## 2019-12-15 NOTE — Telephone Encounter (Signed)
Requested Prescriptions  Pending Prescriptions Disp Refills  . losartan (COZAAR) 100 MG tablet [Pharmacy Med Name: LOSARTAN POTASSIUM 100 MG TAB] 90 tablet 1    Sig: TAKE 1 TABLET BY MOUTH EVERY DAY     Cardiovascular:  Angiotensin Receptor Blockers Passed - 12/15/2019  1:18 AM      Passed - Cr in normal range and within 180 days    Creatinine, Ser  Date Value Ref Range Status  11/29/2019 1.07 0.61 - 1.24 mg/dL Final         Passed - K in normal range and within 180 days    Potassium  Date Value Ref Range Status  11/29/2019 3.8 3.5 - 5.1 mmol/L Final         Passed - Patient is not pregnant      Passed - Last BP in normal range    BP Readings from Last 1 Encounters:  11/29/19 133/79         Passed - Valid encounter within last 6 months    Recent Outpatient Visits          1 month ago Hordeolum externum of right lower eyelid   [place], Wendee Beavers, [name]   3 months ago Annual physical exam   [place], [place], [place]   5 months ago Essential hypertension   [place], [place], [place]   6 months ago Need for shingles vaccine   [place], [place]   9 months ago Hyperlipidemia, unspecified hyperlipidemia type   [place], Wendee Beavers, [place]      Future Appointments            In 4 weeks Hollice Espy, MD [place]   In 1 month Ralene Bathe, MD [place]   In 2 months Trinna Post, [name] [facility], [place]

## 2019-12-22 ENCOUNTER — Other Ambulatory Visit: Payer: Self-pay | Admitting: Physician Assistant

## 2019-12-22 DIAGNOSIS — I10 Essential (primary) hypertension: Secondary | ICD-10-CM

## 2019-12-22 NOTE — Telephone Encounter (Signed)
Requested Prescriptions  Pending Prescriptions Disp Refills  . hydrochlorothiazide (MICROZIDE) 12.5 MG capsule [Pharmacy Med Name: HYDROCHLOROTHIAZIDE 12.5 MG CP] 90 capsule 1    Sig: TAKE 1 CAPSULE BY MOUTH EVERY DAY     Cardiovascular: Diuretics - Thiazide Passed - 12/22/2019  1:32 AM      Passed - Ca in normal range and within 360 days    Calcium  Date Value Ref Range Status  11/29/2019 9.5 8.9 - 10.3 mg/dL Final         Passed - Cr in normal range and within 360 days    Creatinine, Ser  Date Value Ref Range Status  11/29/2019 1.07 0.61 - 1.24 mg/dL Final         Passed - K in normal range and within 360 days    Potassium  Date Value Ref Range Status  11/29/2019 3.8 3.5 - 5.1 mmol/L Final         Passed - Na in normal range and within 360 days    Sodium  Date Value Ref Range Status  11/29/2019 137 135 - 145 mmol/L Final  09/01/2019 143 134 - 144 mmol/L Final         Passed - Last BP in normal range    BP Readings from Last 1 Encounters:  11/29/19 133/79         Passed - Valid encounter within last 6 months    Recent Outpatient Visits          1 month ago Hordeolum externum of right lower eyelid   [place], Brett Beavers, [name]   3 months ago Annual physical exam   [place], [place], [place]   6 months ago Essential hypertension   [place], [place], [place]   6 months ago Need for shingles vaccine   [place], [place], [place]   10 months ago Hyperlipidemia, unspecified hyperlipidemia type   [place], Brett Wilson, [place]      Future Appointments            In 3 weeks Hollice Espy, MD [place]   In 3 weeks Ralene Bathe, MD [place]   In 2 months Trinna Post, [name] [facility], [place]

## 2020-01-11 NOTE — Progress Notes (Signed)
01/12/2020 11:48 AM   Brett Wilson [birth year] [id number]  Referring provider: Trinna Post, [name] [address] [place],  [place] [address] Chief Complaint  Patient presents with   incomplete bladder emptying    HPI: Brett Wilson is a 64 y.o. male with refractory urinary symptoms related to BPH returns for a 6 week post op Urolift follow up.   Patient underwent a Urolift procedure with enucleation on small median lobe on 11/29/2019. Pathology revealed benign prostatic tissue with nodular hypertrophy (benign prostatic hyperplasia). Negative for high-grade prostatic intraepithelial neoplasia and malignancy.   He was presented to ED via EMS on 11/29/2019 for abdominal pain. He developed increasing abdominal pain with what felt like bladder spasms throughout the day.  Spasms initially seem to be intermittent but have now become constant later in the day, not alleviated by the oxycodone or oxybutynin he had been taking at home.  He stated the urine seemed to have been flowing normally through his Foley catheter and blood had gradually cleared up with increasingly lighter pink-tinged urine.  He denied any flank pain. No fevers or vomiting.    After his catheter was removed, he had almost no ongoing pain.  Lab work was unremarkable. UA showed gross hematuria, protein 30, trace leukocytes, RBC 11-20, and rare bacteria.  After about a week postop, symptoms all resolved and he is doing extremely well.  He is no longer taking oxybutynin or Flomax.  He still taking finasteride.  Patient reports adequate emptying. Denies burning with urination.   He is extremely pleased with the result.   IPSS    Row Name 01/12/20 0900         International Prostate Symptom Score   How often have you had the sensation of not emptying your bladder? Not at All     How often have you had to urinate less than every two hours? Not at All     How often have you found you stopped and started again  several times when you urinated? Not at All     How often have you found it difficult to postpone urination? Not at All     How often have you had a weak urinary stream? Not at All     How often have you had to strain to start urination? Not at All     How many times did you typically get up at night to urinate? 1 Time     Total IPSS Score 1       Quality of Life due to urinary symptoms   If you were to spend the rest of your life with your urinary condition just the way it is now how would you feel about that? Delighted            Score:  1-7 Mild 8-19 Moderate 20-35 Severe    PMH: Past Medical History:  Diagnosis Date   Allergy    Hyperlipidemia    Hypertension    Pituitary adenoma [facility])     Surgical History: Past Surgical History:  Procedure Laterality Date   CATARACT EXTRACTION     COLONOSCOPY WITH PROPOFOL N/A 04/13/2019   Procedure: COLONOSCOPY WITH PROPOFOL;  Surgeon: Lucilla Lame, MD;  Location: [facility] ENDOSCOPY;  Service: Endoscopy;  Laterality: N/A;   CYSTOSCOPY WITH INSERTION OF UROLIFT     CYSTOSCOPY WITH INSERTION OF UROLIFT N/A 11/29/2019   Procedure: CYSTOSCOPY WITH INSERTION OF UROLIFT;  Surgeon: Hollice Espy, MD;  Location: [facility] ORS;  Service:  Urology;  Laterality: N/A;   HOLEP-LASER ENUCLEATION OF THE PROSTATE WITH MORCELLATION N/A 11/29/2019   Procedure: HOLEP-LASER ENUCLEATION OF THE PROSTATE;  Surgeon: Hollice Espy, MD;  Location: [facility] ORS;  Service: Urology;  Laterality: N/A;   PITUITARY EXCISION     TONSILLECTOMY      Home Medications:  Allergies as of 01/12/2020      Reactions   Penicillins Hives      Medication List       Accurate as of January 12, 2020 11:48 AM. If you have any questions, ask your nurse or doctor.        STOP taking these medications   erythromycin ophthalmic ointment   HYDROcodone-acetaminophen 5-325 MG tablet Commonly known as: NORCO/VICODIN   oxybutynin 5 MG tablet Commonly known as: DITROPAN     tamsulosin 0.4 MG Caps capsule Commonly known as: FLOMAX     TAKE these medications   cyclobenzaprine 5 MG tablet Commonly known as: FLEXERIL Take 1 tablet (5 mg total) by mouth at bedtime.   finasteride 5 MG tablet Commonly known as: PROSCAR Take 1 tablet (5 mg total) by mouth daily. What changed: when to take this   hydrochlorothiazide 12.5 MG capsule Commonly known as: MICROZIDE TAKE 1 CAPSULE BY MOUTH EVERY DAY   losartan 100 MG tablet Commonly known as: COZAAR TAKE 1 TABLET BY MOUTH EVERY DAY   multivitamin with minerals Tabs tablet Take 1 tablet by mouth daily.   naproxen 500 MG tablet Commonly known as: Naprosyn Take 1 tablet (500 mg total) by mouth daily as needed. What changed: reasons to take this   simvastatin 40 MG tablet Commonly known as: ZOCOR TAKE 1 TABLET BY MOUTH EVERY DAY   Zinc 50 MG Tabs Take 50 mg by mouth daily.       Allergies:  Allergies  Allergen Reactions   Penicillins Hives    Family History: Family History  Problem Relation Age of Onset   Hypertension Mother    Colon cancer Father     Social History:  reports that he has never smoked. He has never used smokeless tobacco. He reports current alcohol use. He reports that he does not use drugs.   Physical Exam: BP (!) 136/82    Pulse 79    Ht 5\' 7"  (1.702 m)    Wt 170 lb (77.1 kg)    BMI 26.63 kg/m   Constitutional:  Alert and oriented, No acute distress. HEENT: [place] AT, moist mucus membranes.  Trachea midline, no masses. Cardiovascular: No clubbing, cyanosis, or edema. Respiratory: Normal respiratory effort, no increased work of breathing. Skin: No rashes, bruises or suspicious lesions. Neurologic: Grossly intact, no focal deficits, moving all 4 extremities. Psychiatric: Normal mood and affect.  Laboratory Data:  Lab Results  Component Value Date   CREATININE 1.07 11/29/2019     Assessment & Plan:   1. BPH with urinary obstruction  Adequate emptying  IPSS score  is 1/35, mild Stop Flomax and oxybutynin  Continue Finasteride 5 mg every other day (will cut back) Consider stopping versus continuing low-dose finasteride and next follow-up visit, 6 months Very pleased with her result   Follow up in 6 months [facility]/ [place] [address] [place], [place] [address] [phone]  I, Selena Batten, am acting as a scribe for Dr. Hollice Espy.  I have reviewed the above documentation for accuracy and completeness, and I agree with the above.   Hollice Espy, MD

## 2020-01-12 ENCOUNTER — Ambulatory Visit (INDEPENDENT_AMBULATORY_CARE_PROVIDER_SITE_OTHER): Payer: BC Managed Care – PPO | Admitting: Urology

## 2020-01-12 VITALS — BP 136/82 | HR 79 | Ht 67.0 in | Wt 170.0 lb

## 2020-01-12 DIAGNOSIS — N138 Other obstructive and reflux uropathy: Secondary | ICD-10-CM

## 2020-01-12 DIAGNOSIS — N401 Enlarged prostate with lower urinary tract symptoms: Secondary | ICD-10-CM

## 2020-01-17 ENCOUNTER — Ambulatory Visit: Payer: BC Managed Care – PPO | Admitting: Dermatology

## 2020-01-17 ENCOUNTER — Other Ambulatory Visit: Payer: Self-pay

## 2020-01-17 DIAGNOSIS — Z808 Family history of malignant neoplasm of other organs or systems: Secondary | ICD-10-CM

## 2020-01-17 DIAGNOSIS — Z1283 Encounter for screening for malignant neoplasm of skin: Secondary | ICD-10-CM

## 2020-01-17 DIAGNOSIS — L905 Scar conditions and fibrosis of skin: Secondary | ICD-10-CM

## 2020-01-17 DIAGNOSIS — D225 Melanocytic nevi of trunk: Secondary | ICD-10-CM

## 2020-01-17 DIAGNOSIS — L821 Other seborrheic keratosis: Secondary | ICD-10-CM

## 2020-01-17 DIAGNOSIS — D229 Melanocytic nevi, unspecified: Secondary | ICD-10-CM

## 2020-01-17 DIAGNOSIS — L720 Epidermal cyst: Secondary | ICD-10-CM | POA: Diagnosis not present

## 2020-01-17 DIAGNOSIS — D492 Neoplasm of unspecified behavior of bone, soft tissue, and skin: Secondary | ICD-10-CM

## 2020-01-17 DIAGNOSIS — D239 Other benign neoplasm of skin, unspecified: Secondary | ICD-10-CM

## 2020-01-17 DIAGNOSIS — L578 Other skin changes due to chronic exposure to nonionizing radiation: Secondary | ICD-10-CM | POA: Diagnosis not present

## 2020-01-17 DIAGNOSIS — D2272 Melanocytic nevi of left lower limb, including hip: Secondary | ICD-10-CM

## 2020-01-17 HISTORY — DX: Other benign neoplasm of skin, unspecified: D23.9

## 2020-01-17 NOTE — Patient Instructions (Signed)

## 2020-01-17 NOTE — Progress Notes (Signed)
New Patient Visit  Subjective  Brett Wilson is a 64 y.o. male who presents for the following: Annual Exam (Total body skin exam, no hx or fhx of skin ca). The patient presents for Total-Body Skin Exam (TBSE) for skin cancer screening and mole check.  Reviewed records from patient's previous dermatologist in [place].  The following portions of the chart were reviewed this encounter and updated as appropriate:  Tobacco  Allergies  Meds  Problems  Med Hx  Surg Hx  Fam Hx     Review of Systems:  No other skin or systemic complaints except as noted in HPI or Assessment and Plan.  Objective  Well appearing patient in no apparent distress; mood and affect are within normal limits.  A full examination was performed including scalp, head, eyes, ears, nose, lips, neck, chest, axillae, abdomen, back, buttocks, bilateral upper extremities, bilateral lower extremities, hands, feet, fingers, toes, fingernails, and toenails. All findings within normal limits unless otherwise noted below.  Objective  chest, shoulders: scarring  Objective  forehead, R posterior shoulder: Cystic paps  Objective  Left mid side: 0.9 x 0.4cm irregular brown macule  Objective  Left calf: 0.6cm irregular brown macule  Objective  LUQA lateral: 1.1 x 0.6cm irregular brown macule   Assessment & Plan    Actinic Damage - diffuse scaly erythematous macules with underlying dyspigmentation - Recommend daily broad spectrum sunscreen SPF 30+ to sun-exposed areas, reapply every 2 hours as needed.  - Call for new or changing lesions.  Melanocytic Nevi - Tan-brown and/or pink-flesh-colored symmetric macules and papules - Benign appearing on exam today - Observation - Call clinic for new or changing moles - Recommend daily use of broad spectrum spf 30+ sunscreen to sun-exposed areas.   Skin cancer screening performed today.  Seborrheic Keratoses - Stuck-on, waxy, tan-brown papules and plaques  - Discussed  benign etiology and prognosis. - Observe - Call for any changes - face, trunk  Scar chest, shoulders Acne Scarring, benign  Epidermal cyst forehead, R posterior shoulder Discussed surgical option.  Neoplasm of skin (3) Left mid side  Epidermal / dermal shaving  Lesion diameter (cm):  0.9 Informed consent: discussed and consent obtained   Timeout: patient name, date of birth, surgical site, and procedure verified   Procedure prep:  Patient was prepped and draped in usual sterile fashion Prep type:  Isopropyl alcohol Anesthesia: the lesion was anesthetized in a standard fashion   Anesthetic:  1% lidocaine w/ epinephrine 1-100,000 buffered w/ 8.4% NaHCO3 Instrument used: flexible razor blade   Hemostasis achieved with: pressure, aluminum chloride and electrodesiccation   Outcome: patient tolerated procedure well   Post-procedure details: sterile dressing applied and wound care instructions given   Dressing type: bandage and petrolatum    Specimen 1 - Surgical pathology Differential Diagnosis: D48.5 Nevus vs Dysplastic Nevus Check Margins: No 0.9 x 0.4cm irregular brown macule  Left calf  Epidermal / dermal shaving  Lesion diameter (cm):  0.6 Informed consent: discussed and consent obtained   Timeout: patient name, date of birth, surgical site, and procedure verified   Procedure prep:  Patient was prepped and draped in usual sterile fashion Prep type:  Isopropyl alcohol Anesthesia: the lesion was anesthetized in a standard fashion   Anesthetic:  1% lidocaine w/ epinephrine 1-100,000 buffered w/ 8.4% NaHCO3 Instrument used: flexible razor blade   Hemostasis achieved with: pressure, aluminum chloride and electrodesiccation   Outcome: patient tolerated procedure well   Post-procedure details: sterile dressing applied and wound  care instructions given   Dressing type: bandage and petrolatum    Specimen 2 - Surgical pathology Differential Diagnosis: D48.5 Nevus vs  Dysplastic Nevus Check Margins: No 0.6cm irregular brown macule  LUQA lateral  Epidermal / dermal shaving  Lesion diameter (cm):  1.1 Informed consent: discussed and consent obtained   Timeout: patient name, date of birth, surgical site, and procedure verified   Procedure prep:  Patient was prepped and draped in usual sterile fashion Prep type:  Isopropyl alcohol Anesthesia: the lesion was anesthetized in a standard fashion   Anesthetic:  1% lidocaine w/ epinephrine 1-100,000 buffered w/ 8.4% NaHCO3 Instrument used: flexible razor blade   Hemostasis achieved with: pressure, aluminum chloride and electrodesiccation   Outcome: patient tolerated procedure well   Post-procedure details: sterile dressing applied and wound care instructions given   Dressing type: bandage and petrolatum    Specimen 3 - Surgical pathology Differential Diagnosis: D48.5 Nevus vs Dysplastic Nevus Check Margins: No 1.1 x 0.6cm irregular brown macule  Return in about 1 year (around 01/16/2021) for TBSE.   I, Othelia Pulling, RMA, am acting as scribe for Sarina Ser, MD .  Documentation: I have reviewed the above documentation for accuracy and completeness, and I agree with the above.  Sarina Ser, MD

## 2020-01-18 ENCOUNTER — Encounter: Payer: Self-pay | Admitting: Physician Assistant

## 2020-01-18 ENCOUNTER — Encounter: Payer: Self-pay | Admitting: Dermatology

## 2020-01-18 DIAGNOSIS — M1990 Unspecified osteoarthritis, unspecified site: Secondary | ICD-10-CM

## 2020-01-19 MED ORDER — NAPROXEN 500 MG PO TABS: 500.0000 mg | ORAL_TABLET | Freq: Every day | ORAL | 1 refills | Status: DC | PRN

## 2020-01-21 IMAGING — CR DG RIBS 2V*R*
1 series · 4 of 4 positions shown · non-contrast
Comparison: None.

CLINICAL DATA: Right chest pain

EXAM:
RIGHT RIBS - 2 VIEW

[Series 1: dg ribs unilateral right · 0.14mm/px · 4 of 4 slices shown]
[im 1/4]
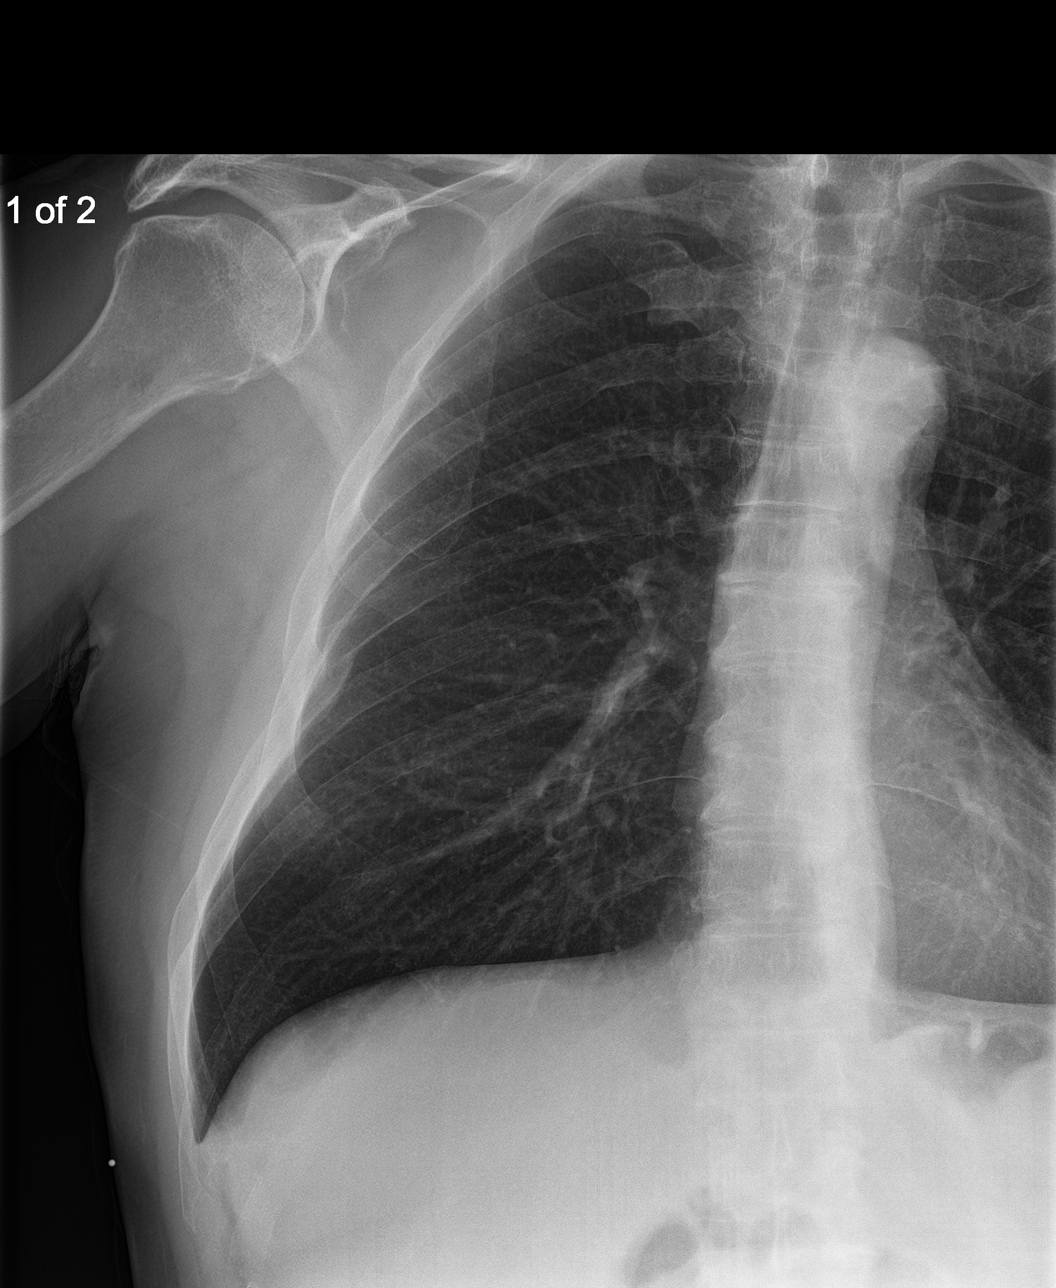
[im 2/4]
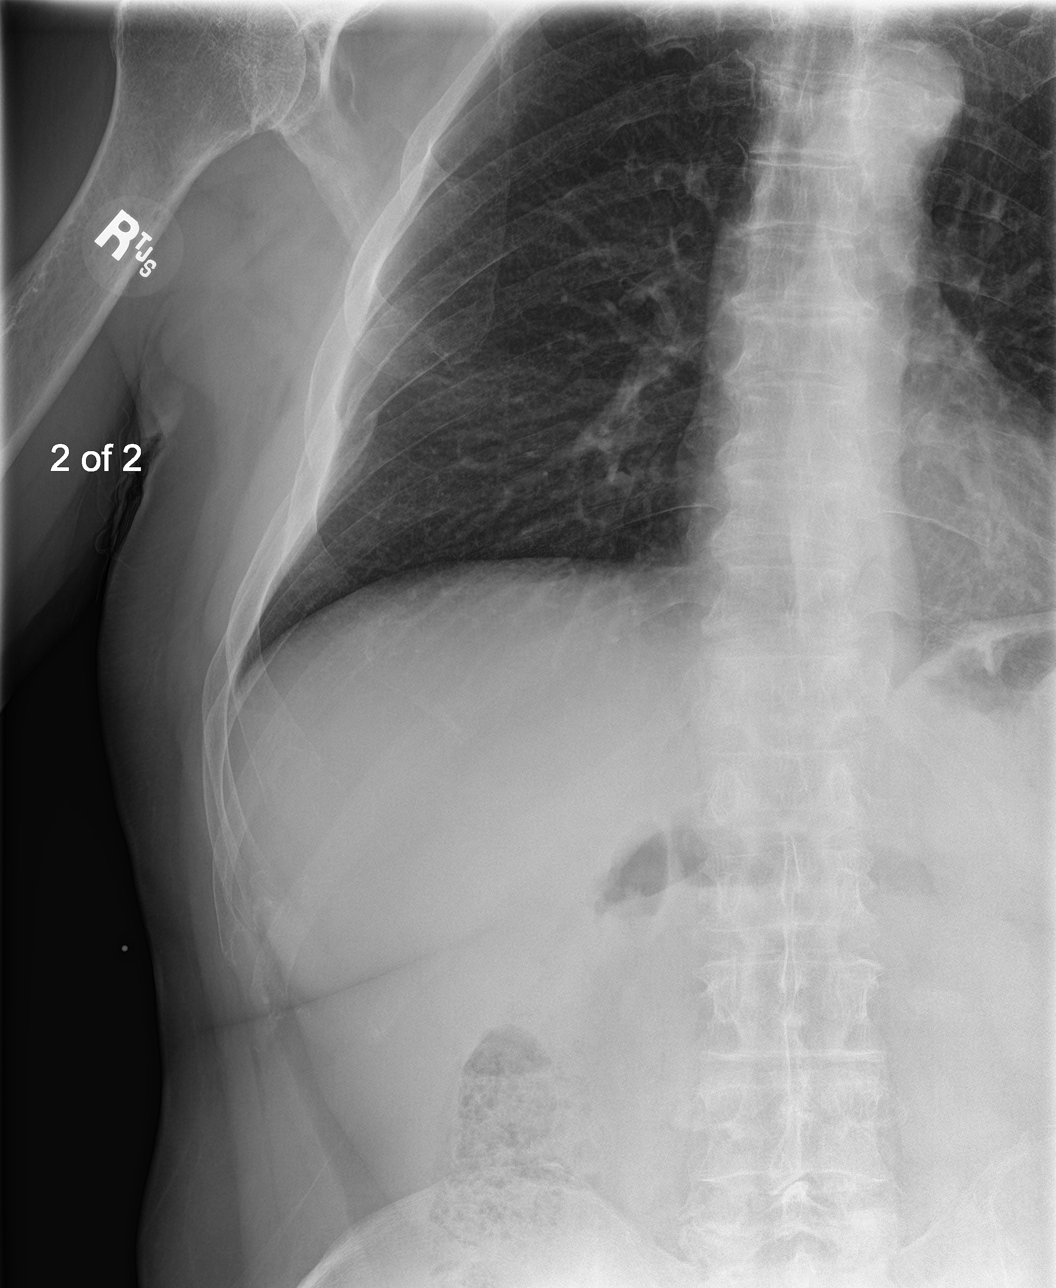
[im 3/4]
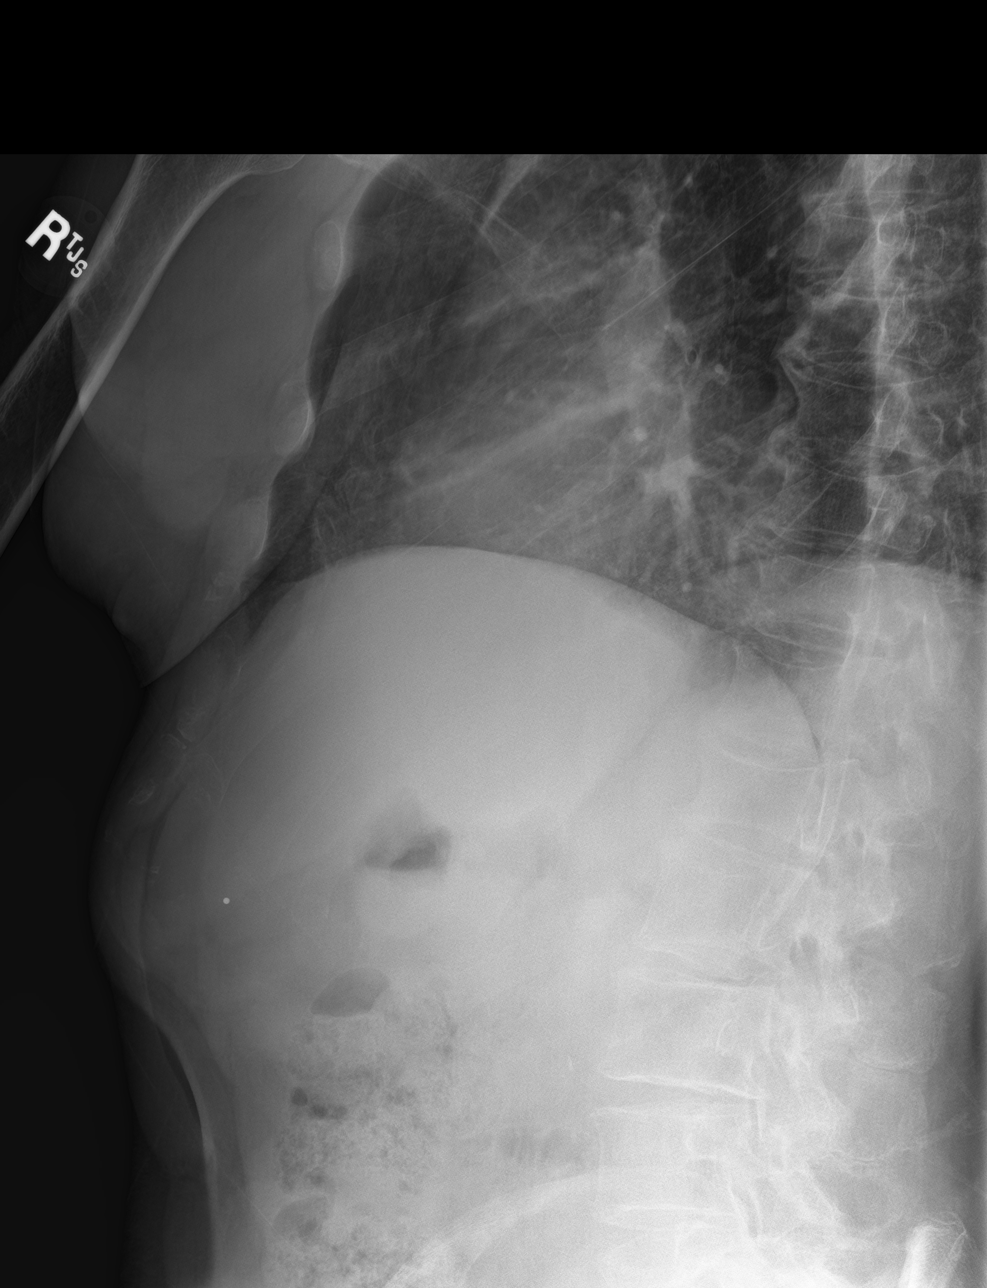
[im 4/4]
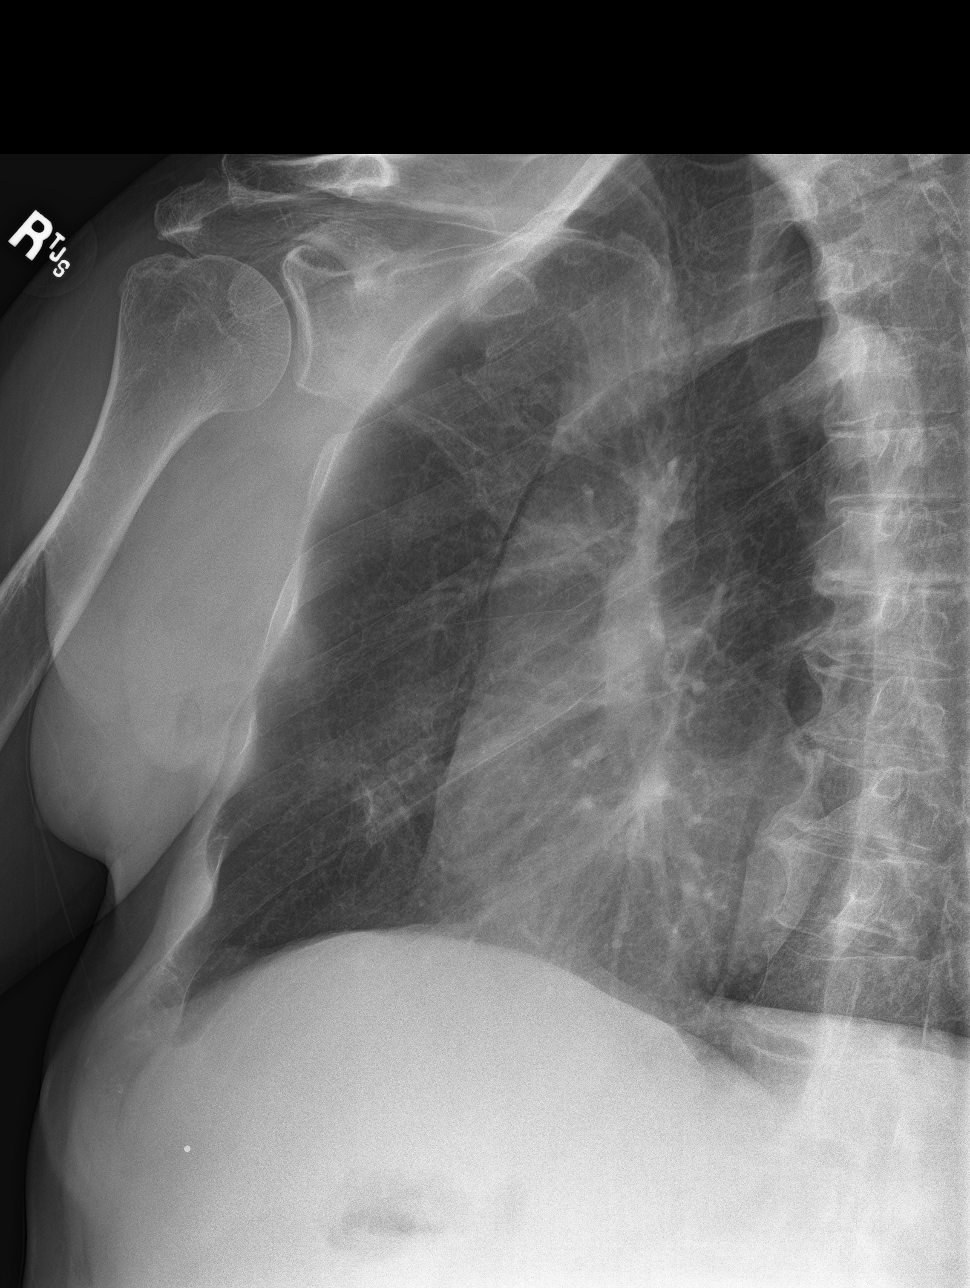

[4 of 4 positions shown; findings below may reference images not displayed]

FINDINGS: There is no acute right rib fracture identified. There is a chronic
fracture of the posterolateral right seventh rib. Right lung is
clear. No right pleural effusion or pneumothorax.
IMPRESSION: No acute right rib fracture.

## 2020-01-24 ENCOUNTER — Telehealth: Payer: Self-pay

## 2020-01-24 NOTE — Telephone Encounter (Signed)
Patient informed of pathology results and surgery scheduled. 

## 2020-01-24 NOTE — Telephone Encounter (Signed)
-----   Message from Ralene Bathe, MD sent at 01/22/2020  1:02 PM EDT ----- 1. 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 Skin , left calf DYSPLASTIC COMPOUND NEVUS WITH MODERATE ATYPIA, DEEP MARGIN INVOLVED 3. Skin , LUQA lateral DYSPLASTIC COMPOUND NEVUS WITH MILD ATYPIA, LIMITED MARGINS FREE  1- Severe dysplastic Schedule surgery 2&3 - dysplastic  Moderate and mild Recheck next visit

## 2020-02-02 ENCOUNTER — Other Ambulatory Visit: Payer: Self-pay

## 2020-02-02 ENCOUNTER — Ambulatory Visit: Payer: BC Managed Care – PPO | Admitting: Dermatology

## 2020-02-02 ENCOUNTER — Telehealth: Payer: Self-pay

## 2020-02-02 DIAGNOSIS — L98491 Non-pressure chronic ulcer of skin of other sites limited to breakdown of skin: Secondary | ICD-10-CM

## 2020-02-02 MED ORDER — MUPIROCIN 2 % EX OINT: TOPICAL_OINTMENT | CUTANEOUS | 0 refills | Status: DC

## 2020-02-02 NOTE — Telephone Encounter (Signed)
Patient came in to office to be seen.

## 2020-02-02 NOTE — Telephone Encounter (Signed)
This patient saw Dr. Raliegh Ip a week and a half ago and he had 3 places removed and he just called and said that one of the places is not healing and seems infected...its draining, painful and itchy.

## 2020-02-02 NOTE — Progress Notes (Signed)
   Follow-Up Visit   Subjective  Brett Wilson is a 64 y.o. male who presents for the following: Wound Check (Pt c/o non healing wound at his LUQ biopsy proven  Dysplastic nevus with mild Atypia 01-17-20).   The following portions of the chart were reviewed this encounter and updated as appropriate:  Tobacco  Allergies  Meds  Problems  Med Hx  Surg Hx  Fam Hx     Review of Systems:  No other skin or systemic complaints except as noted in HPI or Assessment and Plan.  Objective  Well appearing patient in no apparent distress; mood and affect are within normal limits.  A focused examination was performed including LUQA lateral . Relevant physical exam findings are noted in the Assessment and Plan.  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  I, Monika Farrish, CMA, am acting as scribe for Sarina Ser, MD .  Documentation: I have reviewed the above documentation for accuracy and completeness, and I agree with the above.  Sarina Ser, MD

## 2020-02-09 ENCOUNTER — Encounter: Payer: Self-pay | Admitting: Dermatology

## 2020-02-10 ENCOUNTER — Other Ambulatory Visit: Payer: Self-pay | Admitting: Physician Assistant

## 2020-02-10 DIAGNOSIS — R3912 Poor urinary stream: Secondary | ICD-10-CM

## 2020-03-03 ENCOUNTER — Ambulatory Visit: Payer: BC Managed Care – PPO | Admitting: Physician Assistant

## 2020-03-10 ENCOUNTER — Other Ambulatory Visit: Payer: Self-pay | Admitting: Urology

## 2020-03-10 DIAGNOSIS — N401 Enlarged prostate with lower urinary tract symptoms: Secondary | ICD-10-CM

## 2020-03-14 ENCOUNTER — Ambulatory Visit: Payer: BC Managed Care – PPO | Admitting: Dermatology

## 2020-03-14 ENCOUNTER — Encounter: Payer: Self-pay | Admitting: Dermatology

## 2020-03-14 ENCOUNTER — Telehealth: Payer: Self-pay

## 2020-03-14 ENCOUNTER — Other Ambulatory Visit: Payer: Self-pay

## 2020-03-14 DIAGNOSIS — D225 Melanocytic nevi of trunk: Secondary | ICD-10-CM

## 2020-03-14 DIAGNOSIS — D239 Other benign neoplasm of skin, unspecified: Secondary | ICD-10-CM

## 2020-03-14 DIAGNOSIS — L905 Scar conditions and fibrosis of skin: Secondary | ICD-10-CM | POA: Diagnosis not present

## 2020-03-14 NOTE — Telephone Encounter (Signed)
Patient doing fine after today's surgery./sh 

## 2020-03-14 NOTE — Patient Instructions (Signed)

## 2020-03-14 NOTE — Progress Notes (Signed)
   Follow-Up Visit   Subjective  Brett Wilson is a 64 y.o. male who presents for the following: Severe dysplastic nevus bx proven (L mid side, pt presents for excision).  The following portions of the chart were reviewed this encounter and updated as appropriate:  Tobacco  Allergies  Meds  Problems  Med Hx  Surg Hx  Fam Hx     Review of Systems:  No other skin or systemic complaints except as noted in HPI or Assessment and Plan.  Objective  Well appearing patient in no apparent distress; mood and affect are within normal limits.  A focused examination was performed including left side. Relevant physical exam findings are noted in the Assessment and Plan.  Objective  Left mid side: Pink bx site   Assessment & Plan  Dysplastic nevus Left mid side  Severe, bx proven  Start Mupirocin ointment qd to excision site  Start Doxycycline 100 mg 1 po bid with food and plenty of fluid x 5 days samples given  Skin excision - Left mid side  Lesion length (cm):  1.8 Lesion width (cm):  0.6 Margin per side (cm):  0.2 Total excision diameter (cm):  2.2 Informed consent: discussed and consent obtained   Timeout: patient name, date of birth, surgical site, and procedure verified   Procedure prep:  Patient was prepped and draped in usual sterile fashion Prep type:  Isopropyl alcohol and povidone-iodine Anesthesia: the lesion was anesthetized in a standard fashion   Anesthetic:  1% lidocaine w/ epinephrine 1-100,000 buffered w/ 8.4% NaHCO3 Instrument used: #15 blade   Hemostasis achieved with: pressure   Hemostasis achieved with comment:  Electrocautery Outcome: patient tolerated procedure well with no complications   Post-procedure details: sterile dressing applied and wound care instructions given   Dressing type: bandage and pressure dressing (mupirocin)    Skin repair - Left mid side Complexity:  Complex Final length (cm):  4 Reason for type of repair: reduce tension to allow  closure, reduce the risk of dehiscence, infection, and necrosis, reduce subcutaneous dead space and avoid a hematoma, allow closure of the large defect, preserve normal anatomy, preserve normal anatomical and functional relationships and enhance both functionality and cosmetic results   Undermining: area extensively undermined   Undermining comment:  Undermining defect 1.5 cm Subcutaneous layers (deep stitches):  Suture size:  2-0 Suture type: Vicryl (polyglactin 910)   Subcutaneous suture technique: inverted dermal. Fine/surface layer approximation (top stitches):  Suture size:  3-0 Suture type: nylon   Stitches: simple running   Suture removal (days):  7 Hemostasis achieved with: suture and pressure Outcome: patient tolerated procedure well with no complications   Post-procedure details: sterile dressing applied and wound care instructions given   Dressing type: bandage and pressure dressing (mupirocin)    Specimen 1 - Surgical pathology Differential Diagnosis: D48.5 Severe dysplastic Nevus Check Margins: yes Pink bx site [id number]  Return in about 1 week (around 03/21/2020) for suture removal.  I, Ashok Cordia, CMA, am acting as scribe for Sarina Ser, MD .  Documentation: I have reviewed the above documentation for accuracy and completeness, and I agree with the above.  Sarina Ser, MD

## 2020-03-20 ENCOUNTER — Telehealth: Payer: Self-pay

## 2020-03-20 NOTE — Telephone Encounter (Signed)
Discussed biopsy results with pt  °

## 2020-03-20 NOTE — Telephone Encounter (Signed)
-----   Message from Ralene Bathe, MD sent at 03/20/2020 12:34 PM EDT ----- Skin (M), left mid side EXCISION, SCATTERED ATYPICAL MELANOCYTES, DIAGNOSTIC NEVUS NOT SEEN, MARGINS FREE,  Severe dysplastic nevus Margins free

## 2020-03-21 ENCOUNTER — Ambulatory Visit: Payer: BC Managed Care – PPO | Admitting: Dermatology

## 2020-03-22 ENCOUNTER — Other Ambulatory Visit: Payer: Self-pay

## 2020-03-22 ENCOUNTER — Ambulatory Visit (INDEPENDENT_AMBULATORY_CARE_PROVIDER_SITE_OTHER): Payer: BC Managed Care – PPO | Admitting: Dermatology

## 2020-03-22 DIAGNOSIS — Z4802 Encounter for removal of sutures: Secondary | ICD-10-CM

## 2020-03-22 DIAGNOSIS — D239 Other benign neoplasm of skin, unspecified: Secondary | ICD-10-CM

## 2020-03-22 DIAGNOSIS — D235 Other benign neoplasm of skin of trunk: Secondary | ICD-10-CM

## 2020-03-22 NOTE — Progress Notes (Signed)
   Follow-Up Visit   Subjective  Brett Wilson is a 64 y.o. male who presents for the following: Bx proven Severe Dysplastic nevus margins free (L mid side, 1 wk s/p excision, pt presents for suture removal).  The following portions of the chart were reviewed this encounter and updated as appropriate:  Tobacco  Allergies  Meds  Problems  Med Hx  Surg Hx  Fam Hx     Review of Systems:  No other skin or systemic complaints except as noted in HPI or Assessment and Plan.  Objective  Well appearing patient in no apparent distress; mood and affect are within normal limits.  A focused examination was performed including L side. Relevant physical exam findings are noted in the Assessment and Plan.  Objective  Left Flank: Healing excision site   Assessment & Plan  Dysplastic nevus Left Flank Bx proven severe margins free  Healing excision site.  Wound cleansed, sutures removed, wound cleansed and steri strips applied. Discussed pathology results.   Return for As scheduled for TBSE 01/2021.  I, Othelia Pulling, RMA, am acting as scribe for Sarina Ser, MD .  Documentation: I have reviewed the above documentation for accuracy and completeness, and I agree with the above.  Sarina Ser, MD

## 2020-03-23 ENCOUNTER — Encounter: Payer: Self-pay | Admitting: Dermatology

## 2020-04-25 ENCOUNTER — Other Ambulatory Visit: Payer: Self-pay | Admitting: Physician Assistant

## 2020-04-25 NOTE — Telephone Encounter (Signed)
Requested Prescriptions  Pending Prescriptions Disp Refills   simvastatin (ZOCOR) 40 MG tablet [Pharmacy Med Name: SIMVASTATIN 40 MG TABLET] 90 tablet 1    Sig: TAKE 1 TABLET BY MOUTH EVERY DAY     Cardiovascular:  Antilipid - Statins Failed - 04/25/2020  1:37 AM      Failed - LDL in normal range and within 360 days    LDL Chol Calc (NIH)  Date Value Ref Range Status  09/01/2019 102 (H) 0 - 99 mg/dL Final         Failed - Triglycerides in normal range and within 360 days    Triglycerides  Date Value Ref Range Status  09/01/2019 197 (H) 0 - 149 mg/dL Final         Passed - Total Cholesterol in normal range and within 360 days    Cholesterol, Total  Date Value Ref Range Status  09/01/2019 179 100 - 199 mg/dL Final         Passed - HDL in normal range and within 360 days    HDL  Date Value Ref Range Status  09/01/2019 43 >39 mg/dL Final         Passed - Patient is not pregnant      Passed - Valid encounter within last 12 months    Recent Outpatient Visits          6 months ago Hordeolum externum of right lower eyelid   [place], [place], [name]   7 months ago Annual physical exam   [facility] Carles Collet M, [place]   10 months ago Essential hypertension   [place], [place], [place]   10 months ago Need for shingles vaccine   [place], [place], [place]   1 year ago Hyperlipidemia, unspecified hyperlipidemia type   [place], Wendee Beavers, [place]      Future Appointments            In 2 months Hollice Espy, MD [place]

## 2020-05-02 NOTE — Progress Notes (Signed)
Established patient visit   Patient: Brett Wilson   DOB: 27-Aug-1955   64 y.o. Male  MRN: [id number] Visit Date: 05/03/2020  Today's healthcare provider: Trinna Post, [name]   Chief Complaint  Patient presents with  . Loss of Balance  I,Shariff Lasky M Naya [place]agan,acting as a scribe for Trinna Post, [name].,have documented all relevant documentation on the behalf of Trinna Post, [name],as directed by  Trinna Post, [name] while in the presence of Trinna Post, [name].  Subjective    HPI Loss of balance Patient presents today for loss of balance for about 8 days now mainly when standing. He reports lightheadedness and fatigue. Patient denies any chest pain/pressure, palpitations, headaches, dizziness and weakness. He has had BPPV previously which resolved with the Epley maneuvers. He does not feel similar to those episodes. He has not passed out.      Medications: Outpatient Medications Prior to Visit  Medication Sig  . cyclobenzaprine (FLEXERIL) 5 MG tablet Take 1 tablet (5 mg total) by mouth at bedtime.  . finasteride (PROSCAR) 5 MG tablet Take 1 tablet (5 mg total) by mouth at bedtime.  Marland Kitchen losartan (COZAAR) 100 MG tablet TAKE 1 TABLET BY MOUTH EVERY DAY  . Multiple Vitamin (MULTIVITAMIN WITH MINERALS) TABS tablet Take 1 tablet by mouth daily.  . naproxen (NAPROSYN) 500 MG tablet Take 1 tablet (500 mg total) by mouth daily as needed.  . simvastatin (ZOCOR) 40 MG tablet TAKE 1 TABLET BY MOUTH EVERY DAY  . Zinc 50 MG TABS Take 50 mg by mouth daily.  . [DISCONTINUED] hydrochlorothiazide (MICROZIDE) 12.5 MG capsule TAKE 1 CAPSULE BY MOUTH EVERY DAY  . [DISCONTINUED] mupirocin ointment (BACTROBAN) 2 % Apply to skin qd-bid   No facility-administered medications prior to visit.    Review of Systems  Constitutional: Positive for fatigue.  Respiratory: Negative.  Negative for chest tightness.   Cardiovascular: Negative for chest pain and palpitations.  Neurological: Positive  for light-headedness. Negative for dizziness, weakness and headaches.      Objective    BP (!) 152/89 (BP Location: Left Arm, Patient Position: Sitting, Cuff Size: Large)   Pulse 82   Temp 98.6 F (37 C) (Oral)   Wt 177 lb 9.6 oz (80.6 kg)   SpO2 100%   BMI 27.82 kg/m    Physical Exam Constitutional:      Appearance: Normal appearance. He is normal weight.  Cardiovascular:     Rate and Rhythm: Normal rate and regular rhythm.     Heart sounds: Normal heart sounds.  Pulmonary:     Effort: Pulmonary effort is normal.     Breath sounds: Normal breath sounds.  Skin:    General: Skin is warm and dry.  Neurological:     General: No focal deficit present.     Mental Status: He is alert and oriented to person, place, and time.  Psychiatric:        Mood and Affect: Mood normal.        Behavior: Behavior normal.       No results found for any visits on 05/03/20.  Assessment & Plan    1. Orthostatic hypotension  Would recommend stopping HCTZ and following up in 2-4 weeks. May need to replace with a different medication.  2. Essential hypertension  Stop HCTZ.    Return in about 4 weeks (around 05/31/2020).      ITrinna Post, [name], have reviewed all documentation for this visit. The documentation  on 05/03/20 for the exam, diagnosis, procedures, and orders are all accurate and complete.  The entirety of the information documented in the History of Present Illness, Review of Systems and Physical Exam were personally obtained by me. Portions of this information were initially documented by [facility] and reviewed by me for thoroughness and accuracy.     [name]  [facility] [phone] (phone) [phone] (fax)  [place]

## 2020-05-03 ENCOUNTER — Ambulatory Visit: Payer: BC Managed Care – PPO | Admitting: Physician Assistant

## 2020-05-03 ENCOUNTER — Other Ambulatory Visit: Payer: Self-pay

## 2020-05-03 ENCOUNTER — Encounter: Payer: Self-pay | Admitting: Physician Assistant

## 2020-05-03 VITALS — BP 152/89 | HR 82 | Temp 98.6°F | Wt 177.6 lb

## 2020-05-03 DIAGNOSIS — I10 Essential (primary) hypertension: Secondary | ICD-10-CM

## 2020-05-03 DIAGNOSIS — I951 Orthostatic hypotension: Secondary | ICD-10-CM

## 2020-05-14 ENCOUNTER — Other Ambulatory Visit: Payer: Self-pay | Admitting: Physician Assistant

## 2020-05-14 DIAGNOSIS — M1990 Unspecified osteoarthritis, unspecified site: Secondary | ICD-10-CM

## 2020-05-14 NOTE — Telephone Encounter (Signed)
Requested Prescriptions  Pending Prescriptions Disp Refills  . naproxen (NAPROSYN) 500 MG tablet [Pharmacy Med Name: NAPROXEN 500 MG TABLET] 60 tablet 1    Sig: TAKE 1 TABLET BY MOUTH DAILY AS NEEDED.     Analgesics:  NSAIDS Passed - 05/14/2020  9:02 AM      Passed - Cr in normal range and within 360 days    Creatinine, Ser  Date Value Ref Range Status  11/29/2019 1.07 0.61 - 1.24 mg/dL Final         Passed - HGB in normal range and within 360 days    Hemoglobin  Date Value Ref Range Status  11/29/2019 14.4 13.0 - 17.0 g/dL Final  09/01/2019 14.8 13.0 - 17.7 g/dL Final         Passed - Patient is not pregnant      Passed - Valid encounter within last 12 months    Recent Outpatient Visits          1 week ago Orthostatic hypotension   [place], [place], [name]   6 months ago Hordeolum externum of right lower eyelid   [facility] [place], Fabio Bering M, [place]   8 months ago Annual physical exam   [facility] Carles Collet M, [place]   10 months ago Essential hypertension   [place], [place], [place]   11 months ago Need for shingles vaccine   [place], Wendee Beavers, [place]      Future Appointments            In 1 week Trinna Post, [place], [place]   In 2 months Hollice Espy, [place]

## 2020-05-16 ENCOUNTER — Other Ambulatory Visit: Payer: Self-pay

## 2020-05-16 ENCOUNTER — Ambulatory Visit: Payer: BC Managed Care – PPO | Admitting: Urology

## 2020-05-16 ENCOUNTER — Encounter: Payer: Self-pay | Admitting: Urology

## 2020-05-16 VITALS — BP 179/97 | HR 89 | Ht 67.0 in | Wt 178.0 lb

## 2020-05-16 DIAGNOSIS — R361 Hematospermia: Secondary | ICD-10-CM | POA: Diagnosis not present

## 2020-05-16 NOTE — Progress Notes (Signed)
05/16/2020 11:06 AM   Brett Wilson 1955-07-13 [id number]  Referring provider: Trinna Post, [name] [address] [place],  [place] [address]  Chief Complaint  Patient presents with  . Follow-up    Hematospermia    HPI: 64 year old male complaining of hematospermia presents today for further evaluation.  He reports that starting Wednesday last week, he had a episode of painless hematospermia with ejaculation.  He denies any urethral penile trauma.  He is ejaculated 2 additional times and the urine is clearing.  This never happened before.  He denies any baseline urinary symptoms.  No dysuria or gross hematuria.  He does have chronic right intermittent testicular pain for many years which is stable.  No testicular swelling.  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  Procedure was uncomplicated.  Postoperatively, procedure was complicated by bladder spasm and Foley catheter intolerance.  Ultimately, this all resolved and he had been doing extremely well.  He is no longer on oxybutynin or Flomax.  He is cut back on finasteride.  Recent PSA 1.6 on 08/2019.g  PMH: Past Medical History:  Diagnosis Date  . Allergy   . Dysplastic nevus 01/17/2020   Left mid side. Severe atypia with scar, close to margin. exc 03/14/2020  . Dysplastic nevus 01/17/2020   Left calf. Moderate atypia, deep margin involved.   Marland Kitchen Dysplastic nevus 01/17/2020   LUQ abdomen. Mild atypia, limited margins free.   Marland Kitchen Hyperlipidemia   . Hypertension   . Pituitary adenoma [facility])     Surgical History: Past Surgical History:  Procedure Laterality Date  . CATARACT EXTRACTION    . COLONOSCOPY WITH PROPOFOL N/A 04/13/2019   Procedure: COLONOSCOPY WITH PROPOFOL;  Surgeon: Lucilla Lame, MD;  Location: [facility] ENDOSCOPY;  Service: Endoscopy;  Laterality: N/A;  . CYSTOSCOPY WITH INSERTION OF UROLIFT    . CYSTOSCOPY WITH INSERTION OF UROLIFT N/A  11/29/2019   Procedure: CYSTOSCOPY WITH INSERTION OF UROLIFT;  Surgeon: Hollice Espy, MD;  Location: [facility] ORS;  Service: Urology;  Laterality: N/A;  . HOLEP-LASER ENUCLEATION OF THE PROSTATE WITH MORCELLATION N/A 11/29/2019   Procedure: HOLEP-LASER ENUCLEATION OF THE PROSTATE;  Surgeon: Hollice Espy, MD;  Location: [facility] ORS;  Service: Urology;  Laterality: N/A;  . PITUITARY EXCISION    . TONSILLECTOMY      Home Medications:  Allergies as of 05/16/2020      Reactions   Penicillins Hives      Medication List       Accurate as of May 16, 2020 11:06 AM. If you have any questions, ask your nurse or doctor.        cyclobenzaprine 5 MG tablet Commonly known as: FLEXERIL Take 1 tablet (5 mg total) by mouth at bedtime.   finasteride 5 MG tablet Commonly known as: PROSCAR Take 1 tablet (5 mg total) by mouth at bedtime.   losartan 100 MG tablet Commonly known as: COZAAR TAKE 1 TABLET BY MOUTH EVERY DAY   multivitamin with minerals Tabs tablet Take 1 tablet by mouth daily.   naproxen 500 MG tablet Commonly known as: NAPROSYN TAKE 1 TABLET BY MOUTH DAILY AS NEEDED.   simvastatin 40 MG tablet Commonly known as: ZOCOR TAKE 1 TABLET BY MOUTH EVERY DAY   Zinc 50 MG Tabs Take 50 mg by mouth daily.       Allergies:  Allergies  Allergen Reactions  . Penicillins Hives    Family History: Family  History  Problem Relation Age of Onset  . Hypertension Mother   . Colon cancer Father     Social History:  reports that he has never smoked. He has never used smokeless tobacco. He reports current alcohol use. He reports that he does not use drugs.   Physical Exam: BP (!) 179/97   Pulse 89   Ht 5\' 7"  (1.702 m)   Wt 178 lb (80.7 kg)   BMI 27.88 kg/m   Constitutional:  Alert and oriented, No acute distress. HEENT: [place] AT, moist mucus membranes.  Trachea midline, no masses. Cardiovascular: No clubbing, cyanosis, or edema. Respiratory: Normal respiratory effort, no  increased work of breathing. GI: Abdomen is soft, nontender, nondistended, no abdominal masses GU: No CVA tenderness Lymph: No cervical or inguinal lymphadenopathy. Skin: No rashes, bruises or suspicious lesions. Neurologic: Grossly intact, no focal deficits, moving all 4 extremities. Psychiatric: Normal mood and affect.  Laboratory Data: Lab Results  Component Value Date   WBC 12.7 (H) 11/29/2019   HGB 14.4 11/29/2019   HCT 39.2 11/29/2019   MCV 83.1 11/29/2019   PLT 272 11/29/2019    Lab Results  Component Value Date   CREATININE 1.07 11/29/2019    Urinalysis UA today is negative, see epic   Assessment & Plan:    1. Hematospermia  I explained to the patient some of the conditions that may cause hematospermia, such as: disorders of the prostate gland, seminal vesicles, spermatic cord, and ejaculatory duct system; urogenital infections including sexually transmitted infections (eg, chlamydia, herpes simplex virus, gonorrhea, trichomonas); metastatic cancers; vascular malformations; congenital and drug-induced bleeding disorders; and even frequent daily ejaculation over a period of several weeks.  I reassured him that his exam was normal and that we may never discover a reason for his hematospermia and it is most likely a benign symptom.     Given that he is had extensive evaluation including recent UroLift procedure which included cystoscopy, low suspicion for anything pathologic in his bladder.  PSA screening is up-to-date and negative.  UA today is negative, no evidence of UTIs contributing factor  As such, I recommended supportive care.  He is agreeable this plan will follow up as previously scheduled.   - Urinalysis, Complete   Hollice Espy, MD  [facility] [address] [place], [place] [address] [phone]

## 2020-05-19 LAB — MICROSCOPIC EXAMINATION
Bacteria, UA: NONE SEEN
Epithelial Cells (non renal): NONE SEEN /hpf (ref 0–10)

## 2020-05-19 LAB — URINALYSIS, COMPLETE
Bilirubin, UA: NEGATIVE
Glucose, UA: NEGATIVE
Ketones, UA: NEGATIVE
Leukocytes,UA: NEGATIVE
Nitrite, UA: NEGATIVE
Protein,UA: NEGATIVE
RBC, UA: NEGATIVE
Specific Gravity, UA: 1.015 (ref 1.005–1.030)
Urobilinogen, Ur: 0.2 mg/dL (ref 0.2–1.0)
pH, UA: 6 (ref 5.0–7.5)

## 2020-05-23 NOTE — Progress Notes (Signed)
Established patient visit   Patient: Brett Wilson   DOB: [birth year]   64 y.o. Male  MRN: [id number] Visit Date: 05/24/2020  Today's healthcare provider: Trinna Post, [name]   Chief Complaint  Patient presents with  . Orthostatic Hypotension  I,Abbigail Anstey M Raheem Kolbe,acting as a scribe for [employer], [name].,have documented all relevant documentation on the behalf of Trinna Post, [name],as directed by  Trinna Post, [name] while in the presence of Trinna Post, [name].  Subjective    HPI  Follow up for Orthostatic hypotension  The patient was last seen for this 3 weeks ago. Changes made at last visit include stopped the HCTZ. Home blood pressures ranging from 100's-130s/70-85. Clinic blood pressures elevated.   He reports good compliance with treatment. He is not having side effects.  Mild improvement - 10% but stills feels dizziness and uneasiness.  ----------------------------------------------------------------------------------------- Orthostatic VS for the past 72 hrs (Last 3 readings):  Orthostatic BP Patient Position BP Location Cuff Size Orthostatic Pulse  05/24/20 0950 (!) 148/103 Standing Left Arm Large 87  05/24/20 0947 (!) 159/92 Sitting Left Arm Large 72  05/24/20 0944 158/90 Supine Left Arm Large 83              Medications: Outpatient Medications Prior to Visit  Medication Sig  . cyclobenzaprine (FLEXERIL) 5 MG tablet Take 1 tablet (5 mg total) by mouth at bedtime.  . finasteride (PROSCAR) 5 MG tablet Take 1 tablet (5 mg total) by mouth at bedtime.  Marland Kitchen losartan (COZAAR) 100 MG tablet TAKE 1 TABLET BY MOUTH EVERY DAY  . Multiple Vitamin (MULTIVITAMIN WITH MINERALS) TABS tablet Take 1 tablet by mouth daily.  . naproxen (NAPROSYN) 500 MG tablet TAKE 1 TABLET BY MOUTH DAILY AS NEEDED.  Marland Kitchen simvastatin (ZOCOR) 40 MG tablet TAKE 1 TABLET BY MOUTH EVERY DAY  . Zinc 50 MG TABS Take 50 mg by mouth daily.   No facility-administered medications prior  to visit.    Review of Systems  Constitutional: Negative.   Respiratory: Negative.   Cardiovascular: Negative.   Neurological: Positive for dizziness and light-headedness.      Objective    BP (!) 164/100 (BP Location: Left Arm, Patient Position: Sitting, Cuff Size: Large)   Pulse 77   Temp 98.7 F (37.1 C) (Oral)   Wt 180 lb 11.2 oz (82 kg)   SpO2 100%   BMI 28.30 kg/m    Physical Exam Constitutional:      Appearance: Normal appearance.  Skin:    General: Skin is warm and dry.  Neurological:     General: No focal deficit present.     Mental Status: He is alert and oriented to person, place, and time.  Psychiatric:        Mood and Affect: Mood normal.        Behavior: Behavior normal.       No results found for any visits on 05/24/20.  Assessment & Plan    1. Essential hypertension  Clinic BP elevated however home BP log is normotensive. Will keep [place] off for now.   2. Dizziness  Patient still reporting dizziness and imbalance with only mild improvement after discontinuing HCTZ. Recommend ENT f/u.   - Ambulatory referral to ENT   Return if symptoms worsen or fail to improve.      ITrinna Post, [name], have reviewed all documentation for this visit. The documentation on 05/26/20 for the exam, diagnosis, procedures, and orders are  all accurate and complete.  The entirety of the information documented in the History of Present Illness, Review of Systems and Physical Exam were personally obtained by me. Portions of this information were initially documented by [facility] and reviewed by me for thoroughness and accuracy.     [name]  [facility] [phone] (phone) [phone] (fax)  [place]

## 2020-05-24 ENCOUNTER — Other Ambulatory Visit: Payer: Self-pay

## 2020-05-24 ENCOUNTER — Ambulatory Visit: Payer: BC Managed Care – PPO | Admitting: Physician Assistant

## 2020-05-24 ENCOUNTER — Encounter: Payer: Self-pay | Admitting: Physician Assistant

## 2020-05-24 VITALS — BP 164/100 | HR 77 | Temp 98.7°F | Wt 180.7 lb

## 2020-05-24 DIAGNOSIS — R42 Dizziness and giddiness: Secondary | ICD-10-CM | POA: Diagnosis not present

## 2020-05-24 DIAGNOSIS — R519 Headache, unspecified: Secondary | ICD-10-CM | POA: Diagnosis not present

## 2020-05-24 DIAGNOSIS — I10 Essential (primary) hypertension: Secondary | ICD-10-CM

## 2020-05-24 NOTE — Patient Instructions (Signed)

## 2020-05-29 ENCOUNTER — Encounter: Payer: Self-pay | Admitting: Physician Assistant

## 2020-05-29 NOTE — Progress Notes (Addendum)
Established patient visit   Patient: Brett Wilson   DOB: 20-Jun-1955   64 y.o. Male  MRN: [id number] Visit Date: 05/24/2020  Today's healthcare provider: Trinna Post, [name]   Chief Complaint  Patient presents with  . Orthostatic Hypotension  I,Danaja Lasota M Audyn Dimercurio,acting as a scribe for [employer], [name].,have documented all relevant documentation on the behalf of Trinna Post, [name],as directed by  Trinna Post, [name] while in the presence of Trinna Post, [name].  Subjective    HPI  Follow up for Orthostatic hypotension  The patient was last seen for this 3 weeks ago. Changes made at last visit include stopped the HCTZ. Home blood pressures ranging from 100's-130s/70-85. Clinic blood pressures elevated.   He reports good compliance with treatment. He is not having side effects.  Mild improvement - 10% but stills feels dizziness and uneasiness.  ----------------------------------------------------------------------------------------- No data found.            Medications: Outpatient Medications Prior to Visit  Medication Sig  . cyclobenzaprine (FLEXERIL) 5 MG tablet Take 1 tablet (5 mg total) by mouth at bedtime.  . finasteride (PROSCAR) 5 MG tablet Take 1 tablet (5 mg total) by mouth at bedtime.  Marland Kitchen losartan (COZAAR) 100 MG tablet TAKE 1 TABLET BY MOUTH EVERY DAY  . Multiple Vitamin (MULTIVITAMIN WITH MINERALS) TABS tablet Take 1 tablet by mouth daily.  . naproxen (NAPROSYN) 500 MG tablet TAKE 1 TABLET BY MOUTH DAILY AS NEEDED.  Marland Kitchen simvastatin (ZOCOR) 40 MG tablet TAKE 1 TABLET BY MOUTH EVERY DAY  . Zinc 50 MG TABS Take 50 mg by mouth daily.   No facility-administered medications prior to visit.    Review of Systems  Constitutional: Negative.   Respiratory: Negative.   Cardiovascular: Negative.   Neurological: Positive for dizziness and light-headedness.      Objective    BP (!) 164/100 (BP Location: Left Arm, Patient Position: Sitting,  Cuff Size: Large)   Pulse 77   Temp 98.7 F (37.1 C) (Oral)   Wt 180 lb 11.2 oz (82 kg)   SpO2 100%   BMI 28.30 kg/m    Physical Exam Constitutional:      Appearance: Normal appearance.  Skin:    General: Skin is warm and dry.  Neurological:     General: No focal deficit present.     Mental Status: He is alert and oriented to person, place, and time.  Psychiatric:        Mood and Affect: Mood normal.        Behavior: Behavior normal.       No results found for any visits on 05/24/20.  Assessment & Plan    1. Essential hypertension  Clinic BP elevated however home BP log is normotensive. Will keep [place] off for now.   2. Dizziness  Patient still reporting dizziness and imbalance with only mild improvement after discontinuing HCTZ. Recommend ENT f/u.   Patient reports new symptoms that emerged after visit with headaches x 3 days. Not the worst headache of his life but it is a new headache for him as he doesn't typically have headaches. These are increasing in frequency.   - Ambulatory referral to ENT   Return if symptoms worsen or fail to improve.      ITrinna Post, [name], have reviewed all documentation for this visit. The documentation on 05/29/20 for the exam, diagnosis, procedures, and orders are all accurate and complete.  The entirety of  the information documented in the History of Present Illness, Review of Systems and Physical Exam were personally obtained by me. Portions of this information were initially documented by [facility] and reviewed by me for thoroughness and accuracy.   I spent 30 minutes dedicated to the care of this patient on the date of this encounter to include pre-visit review of records, face-to-face time with the patient discussing dizziness, and post visit ordering of testing.   [name]  [facility] [phone] (phone) [phone] (fax)  [place]

## 2020-05-29 NOTE — Addendum Note (Signed)
Addended by: Trinna Post on: 05/29/2020 04:28 PM   Modules accepted: Orders, Level of Service

## 2020-06-04 ENCOUNTER — Other Ambulatory Visit: Payer: Self-pay | Admitting: Physician Assistant

## 2020-06-04 DIAGNOSIS — I10 Essential (primary) hypertension: Secondary | ICD-10-CM

## 2020-06-04 NOTE — Telephone Encounter (Signed)
Requested Prescriptions  Pending Prescriptions Disp Refills  . losartan (COZAAR) 100 MG tablet [Pharmacy Med Name: LOSARTAN POTASSIUM 100 MG TAB] 90 tablet 1    Sig: TAKE 1 TABLET BY MOUTH EVERY DAY     Cardiovascular:  Angiotensin Receptor Blockers Failed - 06/04/2020  9:04 AM      Failed - Cr in normal range and within 180 days    Creatinine, Ser  Date Value Ref Range Status  11/29/2019 1.07 0.61 - 1.24 mg/dL Final         Failed - K in normal range and within 180 days    Potassium  Date Value Ref Range Status  11/29/2019 3.8 3.5 - 5.1 mmol/L Final         Failed - Last BP in normal range    BP Readings from Last 1 Encounters:  05/24/20 (!) 164/100         Passed - Patient is not pregnant      Passed - Valid encounter within last 6 months    Recent Outpatient Visits          1 week ago Essential hypertension   [place], [place], [name]   1 month ago Orthostatic hypotension   [place], [place], [place]   7 months ago Hordeolum externum of right lower eyelid   [facility] [place], Fabio Bering M, [place]   9 months ago Annual physical exam   [facility] Carles Collet M, [place]   11 months ago Essential hypertension   [employer], Wendee Beavers, [place]      Future Appointments            In 1 month Hollice Espy, MD [place]   In 2 months Trinna Post, [name] [employer], [place]

## 2020-06-05 ENCOUNTER — Telehealth: Payer: Self-pay | Admitting: Physician Assistant

## 2020-06-05 NOTE — Telephone Encounter (Signed)
Insurance company would like to speak peer to peer for approval of CT of head. Phone [phone].Case # [id number]

## 2020-06-13 ENCOUNTER — Encounter: Payer: Self-pay | Admitting: Physician Assistant

## 2020-06-13 ENCOUNTER — Ambulatory Visit: Payer: BC Managed Care – PPO

## 2020-06-13 DIAGNOSIS — G4452 New daily persistent headache (NDPH): Secondary | ICD-10-CM

## 2020-06-13 NOTE — Addendum Note (Signed)
Addended by: Trey Sailors on: 06/13/2020 04:36 PM   Modules accepted: Orders

## 2020-06-16 ENCOUNTER — Other Ambulatory Visit: Payer: Self-pay | Admitting: Physician Assistant

## 2020-06-16 DIAGNOSIS — I10 Essential (primary) hypertension: Secondary | ICD-10-CM

## 2020-06-19 DIAGNOSIS — H9311 Tinnitus, right ear: Secondary | ICD-10-CM | POA: Diagnosis not present

## 2020-06-19 DIAGNOSIS — R42 Dizziness and giddiness: Secondary | ICD-10-CM | POA: Diagnosis not present

## 2020-06-20 ENCOUNTER — Encounter: Payer: Self-pay | Admitting: Physician Assistant

## 2020-06-23 NOTE — Progress Notes (Signed)
Office note was faxed

## 2020-06-28 ENCOUNTER — Encounter: Payer: Self-pay | Admitting: Physician Assistant

## 2020-07-10 ENCOUNTER — Ambulatory Visit
Admission: RE | Admit: 2020-07-10 | Discharge: 2020-07-10 | Disposition: A | Discharge status: Home / Self Care | Payer: BC Managed Care – PPO | Source: Ambulatory Visit | Attending: Physician Assistant | Admitting: Physician Assistant

## 2020-07-10 ENCOUNTER — Encounter: Payer: Self-pay | Admitting: Physician Assistant

## 2020-07-10 ENCOUNTER — Other Ambulatory Visit: Payer: Self-pay

## 2020-07-10 DIAGNOSIS — G4452 New daily persistent headache (NDPH): Secondary | ICD-10-CM | POA: Insufficient documentation

## 2020-07-10 DIAGNOSIS — R42 Dizziness and giddiness: Secondary | ICD-10-CM | POA: Diagnosis not present

## 2020-07-19 ENCOUNTER — Encounter: Payer: Self-pay | Admitting: Urology

## 2020-07-19 ENCOUNTER — Other Ambulatory Visit: Payer: Self-pay

## 2020-07-19 ENCOUNTER — Ambulatory Visit: Payer: BC Managed Care – PPO | Admitting: Urology

## 2020-07-19 VITALS — BP 172/93 | HR 75 | Ht 67.0 in | Wt 180.0 lb

## 2020-07-19 DIAGNOSIS — N138 Other obstructive and reflux uropathy: Secondary | ICD-10-CM

## 2020-07-19 DIAGNOSIS — N401 Enlarged prostate with lower urinary tract symptoms: Secondary | ICD-10-CM

## 2020-07-19 LAB — BLADDER SCAN AMB NON-IMAGING: Scan Result: 0

## 2020-07-19 NOTE — Progress Notes (Signed)
07/19/2020 1:08 PM   Jeneen Rinks [facility] [birth year] [id number]  Referring provider: Trinna Post, [name] [address] [place],  [place] [address]    HPI: 65 year old male with personal history of BPH status post UroLift with enucleation of the median lobe returns today for 38-month follow-up.  He continues to be completely asymptomatic.  He is extremely pleased with his voiding symptoms.  He is currently taking a half dose of finasteride daily but no longer on any other BPH medications.  He denies any dysuria, gross hematuria or any other urinary symptoms.  He has had no further episodes of hematospermia.  He reports that he only experiences retrograde ejaculation about 50% the time.  He is not bothered by this.  He reports that he is having labs drawn in March by his PCP which will include a PSA.   IPSS    Row Name 07/19/20 0900         International Prostate Symptom Score   How often have you had the sensation of not emptying your bladder? Not at All     How often have you had to urinate less than every two hours? Less than 1 in 5 times     How often have you found you stopped and started again several times when you urinated? Not at All     How often have you found it difficult to postpone urination? Not at All     How often have you had a weak urinary stream? Not at All     How often have you had to strain to start urination? Not at All     How many times did you typically get up at night to urinate? 1 Time     Total IPSS Score 2           Quality of Life due to urinary symptoms   If you were to spend the rest of your life with your urinary condition just the way it is now how would you feel about that? Pleased            Score:  1-7 Mild 8-19 Moderate 20-35 Severe    PMH: Past Medical History:  Diagnosis Date  . Allergy   . Dysplastic nevus 01/17/2020   Left mid side. Severe atypia with scar, close to margin. exc 03/14/2020  . Dysplastic nevus  01/17/2020   Left calf. Moderate atypia, deep margin involved.   Marland Kitchen Dysplastic nevus 01/17/2020   LUQ abdomen. Mild atypia, limited margins free.   Marland Kitchen Hyperlipidemia   . Hypertension   . Pituitary adenoma [facility])     Surgical History: Past Surgical History:  Procedure Laterality Date  . CATARACT EXTRACTION    . COLONOSCOPY WITH PROPOFOL N/A 04/13/2019   Procedure: COLONOSCOPY WITH PROPOFOL;  Surgeon: Lucilla Lame, MD;  Location: [facility] ENDOSCOPY;  Service: Endoscopy;  Laterality: N/A;  . CYSTOSCOPY WITH INSERTION OF UROLIFT    . CYSTOSCOPY WITH INSERTION OF UROLIFT N/A 11/29/2019   Procedure: CYSTOSCOPY WITH INSERTION OF UROLIFT;  Surgeon: Hollice Espy, MD;  Location: [facility] ORS;  Service: Urology;  Laterality: N/A;  . HOLEP-LASER ENUCLEATION OF THE PROSTATE WITH MORCELLATION N/A 11/29/2019   Procedure: HOLEP-LASER ENUCLEATION OF THE PROSTATE;  Surgeon: Hollice Espy, MD;  Location: [facility] ORS;  Service: Urology;  Laterality: N/A;  . PITUITARY EXCISION    . TONSILLECTOMY      Home Medications:  Allergies as of 07/19/2020      Reactions   Penicillins Hives  Medication List       Accurate as of July 19, 2020  1:08 PM. If you have any questions, ask your nurse or doctor.        cyclobenzaprine 5 MG tablet Commonly known as: FLEXERIL Take 1 tablet (5 mg total) by mouth at bedtime.   finasteride 5 MG tablet Commonly known as: PROSCAR Take 1 tablet (5 mg total) by mouth at bedtime.   losartan 100 MG tablet Commonly known as: COZAAR TAKE 1 TABLET BY MOUTH EVERY DAY   multivitamin with minerals Tabs tablet Take 1 tablet by mouth daily.   naproxen 500 MG tablet Commonly known as: NAPROSYN TAKE 1 TABLET BY MOUTH DAILY AS NEEDED.   simvastatin 40 MG tablet Commonly known as: ZOCOR TAKE 1 TABLET BY MOUTH EVERY DAY   Zinc 50 MG Tabs Take 50 mg by mouth daily.       Allergies:  Allergies  Allergen Reactions  . Penicillins Hives    Family History: Family  History  Problem Relation Age of Onset  . Hypertension Mother   . Colon cancer Father     Social History:  reports that he has never smoked. He has never used smokeless tobacco. He reports current alcohol use. He reports that he does not use drugs.   Physical Exam: BP (!) 172/93   Pulse 75   Ht 5\' 7"  (1.702 m)   Wt 180 lb (81.6 kg)   BMI 28.19 kg/m   Constitutional:  Alert and oriented, No acute distress. HEENT: [place] AT, moist mucus membranes.  Trachea midline, no masses. Cardiovascular: No clubbing, cyanosis, or edema. Respiratory: Normal respiratory effort, no increased work of breathing. Skin: No rashes, bruises or suspicious lesions. Neurologic: Grossly intact, no focal deficits, moving all 4 extremities. Psychiatric: Normal mood and affect.  Pertinent Imaging: Results for orders placed or performed in visit on 07/19/20  Bladder Scan (Post Void Residual) in office  Result Value Ref Range   Scan Result 0      Assessment & Plan:    1. BPH with urinary obstruction Continues to have durable excellent response to outlet procedure  He is still on half dose of finasteride.  We discussed the risk and benefits of stopping this medication.  Given the minimal side effects, he would like to go ahead and just continue this for maintenance purposes and hopefully to avoid procedure in the future if he has prostatic regrowth.  He will have his PSA screening by his PCP in March.  At this point time, given that he is doing so well, is reasonable to release him back to his primary care.  If he has any issues, recurrence of his urinary symptoms he should return sooner.  He is agreeable this plan. - Bladder Scan (Post Void Residual) in office   Hollice Espy, MD  [place] [address], [place], [place] [address] [phone]

## 2020-07-20 NOTE — Progress Notes (Signed)
Established patient visit   Patient: Brett Wilson   DOB: May 10, 1956   65 y.o. Male  MRN: [id number] Visit Date: 07/21/2020  Today's healthcare provider: Trinna Post, [name]   Chief Complaint  Patient presents with  . Insomnia  I,Brett Wilson M Brett Wilson,acting as a scribe for Brett Post, [name].,have documented all relevant documentation on the behalf of Brett Post, [name],as directed by  Brett Post, [name] while in the presence of Brett Post, [name].  Subjective    HPI   Insomnia Patient presents today for insomnia. He reports having it in the past since age 74 . Every so often he has 1-2 weeks of insomnia. He has been evaluated for sleep apnea and has had sleep studies prior that were all negative. He has been evaluated for bipolar depression and this has been negative. This is usually resolved with Zolpidem 5 mg QHS #14. Patient reports for the past 2 weeks he has not slept 6 days. He reports getting 5-6 hours of sleep a night.     Medications: Outpatient Medications Prior to Visit  Medication Sig  . cyclobenzaprine (FLEXERIL) 5 MG tablet Take 1 tablet (5 mg total) by mouth at bedtime.  . finasteride (PROSCAR) 5 MG tablet Take 1 tablet (5 mg total) by mouth at bedtime.  Marland Kitchen losartan (COZAAR) 100 MG tablet TAKE 1 TABLET BY MOUTH EVERY DAY  . Multiple Vitamin (MULTIVITAMIN WITH MINERALS) TABS tablet Take 1 tablet by mouth daily.  . naproxen (NAPROSYN) 500 MG tablet TAKE 1 TABLET BY MOUTH DAILY AS NEEDED.  Marland Kitchen simvastatin (ZOCOR) 40 MG tablet TAKE 1 TABLET BY MOUTH EVERY DAY  . Zinc 50 MG TABS Take 50 mg by mouth daily.   No facility-administered medications prior to visit.    Review of Systems  Respiratory: Negative.   Neurological: Negative.   Psychiatric/Behavioral: Positive for sleep disturbance.       Objective    BP (!) 148/86 (BP Location: Left Arm, Patient Position: Sitting, Cuff Size: Large)   Pulse 80   Temp 98.6 F (37 C) (Oral)   Wt 180 lb 8  oz (81.9 kg)   SpO2 100%   BMI 28.27 kg/m     Physical Exam Constitutional:      Appearance: Normal appearance.  Skin:    General: Skin is warm and dry.  Neurological:     General: No focal deficit present.     Mental Status: He is alert and oriented to person, place, and time.  Psychiatric:        Mood and Affect: Mood normal.        Behavior: Behavior normal.       No results found for any visits on 07/21/20.  Assessment & Plan    1. Insomnia, unspecified type  Short course of Ambien to reset sleep cycle, would not recommend taking this medication chronically.   - zolpidem (AMBIEN) 5 MG tablet; Take 1 tablet (5 mg total) by mouth at bedtime as needed for sleep.  Dispense: 15 tablet; Refill: 0       I, Brett Post, [name], have reviewed all documentation for this visit. The documentation on 07/21/20 for the exam, diagnosis, procedures, and orders are all accurate and complete.  The entirety of the information documented in the History of Present Illness, Review of Systems and Physical Exam were personally obtained by me. Portions of this information were initially documented by [facility] and reviewed by me for thoroughness  and accuracy.     Brett Wilson  [facility] [phone] (phone) [phone] (fax)  [place]

## 2020-07-21 ENCOUNTER — Other Ambulatory Visit: Payer: Self-pay

## 2020-07-21 ENCOUNTER — Ambulatory Visit: Payer: BC Managed Care – PPO | Admitting: Physician Assistant

## 2020-07-21 ENCOUNTER — Encounter: Payer: Self-pay | Admitting: Physician Assistant

## 2020-07-21 VITALS — BP 148/86 | HR 80 | Temp 98.6°F | Wt 180.5 lb

## 2020-07-21 DIAGNOSIS — G4700 Insomnia, unspecified: Secondary | ICD-10-CM

## 2020-07-21 MED ORDER — ZOLPIDEM TARTRATE 5 MG PO TABS: 5.0000 mg | ORAL_TABLET | Freq: Every evening | ORAL | 0 refills | Status: DC | PRN

## 2020-07-31 DIAGNOSIS — H26492 Other secondary cataract, left eye: Secondary | ICD-10-CM | POA: Diagnosis not present

## 2020-07-31 DIAGNOSIS — H40013 Open angle with borderline findings, low risk, bilateral: Secondary | ICD-10-CM | POA: Diagnosis not present

## 2020-07-31 DIAGNOSIS — H26493 Other secondary cataract, bilateral: Secondary | ICD-10-CM | POA: Diagnosis not present

## 2020-07-31 DIAGNOSIS — Z961 Presence of intraocular lens: Secondary | ICD-10-CM | POA: Diagnosis not present

## 2020-07-31 DIAGNOSIS — H43813 Vitreous degeneration, bilateral: Secondary | ICD-10-CM | POA: Diagnosis not present

## 2020-08-10 ENCOUNTER — Encounter: Payer: Self-pay | Admitting: Physician Assistant

## 2020-08-10 DIAGNOSIS — M542 Cervicalgia: Secondary | ICD-10-CM

## 2020-08-11 MED ORDER — CYCLOBENZAPRINE HCL 5 MG PO TABS: 5.0000 mg | ORAL_TABLET | Freq: Every day | ORAL | 2 refills | Status: DC

## 2020-09-01 ENCOUNTER — Other Ambulatory Visit: Payer: Self-pay

## 2020-09-01 ENCOUNTER — Encounter: Payer: Self-pay | Admitting: Physician Assistant

## 2020-09-01 ENCOUNTER — Ambulatory Visit (INDEPENDENT_AMBULATORY_CARE_PROVIDER_SITE_OTHER): Payer: BC Managed Care – PPO | Admitting: Physician Assistant

## 2020-09-01 VITALS — BP 134/97 | HR 71 | Temp 98.7°F | Ht 66.0 in | Wt 177.2 lb

## 2020-09-01 DIAGNOSIS — E785 Hyperlipidemia, unspecified: Secondary | ICD-10-CM

## 2020-09-01 DIAGNOSIS — Z0000 Encounter for general adult medical examination without abnormal findings: Secondary | ICD-10-CM

## 2020-09-01 DIAGNOSIS — R3912 Poor urinary stream: Secondary | ICD-10-CM

## 2020-09-01 DIAGNOSIS — N401 Enlarged prostate with lower urinary tract symptoms: Secondary | ICD-10-CM | POA: Diagnosis not present

## 2020-09-01 DIAGNOSIS — I10 Essential (primary) hypertension: Secondary | ICD-10-CM

## 2020-09-01 DIAGNOSIS — Z125 Encounter for screening for malignant neoplasm of prostate: Secondary | ICD-10-CM | POA: Diagnosis not present

## 2020-09-01 NOTE — Progress Notes (Signed)
Complete physical exam   Patient: Brett Wilson   DOB: 02/17/1956   65 y.o. Male  MRN: [id number] Visit Date: 09/01/2020  Today's healthcare provider: Trinna Post, [name]   Chief Complaint  Patient presents with  . Annual Exam  I,Brett Wilson M Berthel Bagnall,acting as a scribe for Trinna Post, [name].,have documented all relevant documentation on the behalf of Trinna Post, [name],as directed by  Trinna Post, [name] while in the presence of Trinna Post, [name].  Subjective    Brett Wilson is a 65 y.o. male who presents today for a complete physical exam.  He reports consuming a low carb, high fiber, low protein diet. Home exercise routine includes weightlifting. He generally feels fairly well. He reports sleeping fairly well. He does not have additional problems to discuss today.  HPI    Past Medical History:  Diagnosis Date  . Allergy   . Dysplastic nevus 01/17/2020   Left mid side. Severe atypia with scar, close to margin. exc 03/14/2020  . Dysplastic nevus 01/17/2020   Left calf. Moderate atypia, deep margin involved.   Brett Wilson Dysplastic nevus 01/17/2020   LUQ abdomen. Mild atypia, limited margins free.   Brett Wilson Hyperlipidemia   . Hypertension   . Pituitary adenoma [facility])    Past Surgical History:  Procedure Laterality Date  . CATARACT EXTRACTION    . COLONOSCOPY WITH PROPOFOL N/A 04/13/2019   Procedure: COLONOSCOPY WITH PROPOFOL;  Surgeon: Lucilla Lame, MD;  Location: [facility] ENDOSCOPY;  Service: Endoscopy;  Laterality: N/A;  . CYSTOSCOPY WITH INSERTION OF UROLIFT    . CYSTOSCOPY WITH INSERTION OF UROLIFT N/A 11/29/2019   Procedure: CYSTOSCOPY WITH INSERTION OF UROLIFT;  Surgeon: Hollice Espy, MD;  Location: [facility] ORS;  Service: Urology;  Laterality: N/A;  . HOLEP-LASER ENUCLEATION OF THE PROSTATE WITH MORCELLATION N/A 11/29/2019   Procedure: HOLEP-LASER ENUCLEATION OF THE PROSTATE;  Surgeon: Hollice Espy, MD;  Location: [facility] ORS;  Service: Urology;  Laterality: N/A;  .  PITUITARY EXCISION    . TONSILLECTOMY     Social History   Socioeconomic History  . Marital status: Married    Spouse name: Not on file  . Number of children: Not on file  . Years of education: Not on file  . Highest education level: Not on file  Occupational History  . Not on file  Tobacco Use  . Smoking status: Never Smoker  . Smokeless tobacco: Never Used  Vaping Use  . Vaping Use: Never used  Substance and Sexual Activity  . Alcohol use: Yes  . Drug use: Never  . Sexual activity: Not on file  Other Topics Concern  . Not on file  Social History Narrative  . Not on file   Social Determinants of Health   Financial Resource Strain: Not on file  Food Insecurity: Not on file  Transportation Needs: Not on file  Physical Activity: Not on file  Stress: Not on file  Social Connections: Not on file  Intimate Partner Violence: Not on file   Family Status  Relation Name Status  . Mother  (Not Specified)  . Father  (Not Specified)   Family History  Problem Relation Age of Onset  . Hypertension Mother   . Colon cancer Father    Allergies  Allergen Reactions  . Penicillins Hives    Patient Care Team: [name] as PCP - General (Physician Assistant)   Medications: Outpatient Medications Prior to Visit  Medication Sig  . cyclobenzaprine (FLEXERIL) 5  MG tablet Take 1 tablet (5 mg total) by mouth at bedtime.  . finasteride (PROSCAR) 5 MG tablet Take 1 tablet (5 mg total) by mouth at bedtime.  Brett Wilson losartan (COZAAR) 100 MG tablet TAKE 1 TABLET BY MOUTH EVERY DAY  . Multiple Vitamin (MULTIVITAMIN WITH MINERALS) TABS tablet Take 1 tablet by mouth daily.  . naproxen (NAPROSYN) 500 MG tablet TAKE 1 TABLET BY MOUTH DAILY AS NEEDED.  Brett Wilson simvastatin (ZOCOR) 40 MG tablet TAKE 1 TABLET BY MOUTH EVERY DAY  . Zinc 50 MG TABS Take 50 mg by mouth daily.  Brett Wilson zolpidem (AMBIEN) 5 MG tablet Take 1 tablet (5 mg total) by mouth at bedtime as needed for sleep.   No  facility-administered medications prior to visit.    Review of Systems  Constitutional: Negative.   HENT: Negative.   Eyes: Negative.   Respiratory: Negative.   Cardiovascular: Negative.   Gastrointestinal: Negative.   Endocrine: Negative.   Genitourinary: Negative.   Musculoskeletal: Negative.   Skin: Negative.   Allergic/Immunologic: Negative.   Neurological: Negative.   Hematological: Negative.   Psychiatric/Behavioral: Negative.       Objective    BP (!) 134/97 (BP Location: Left Arm, Patient Position: Sitting, Cuff Size: Normal)   Pulse 71   Temp 98.7 F (37.1 C) (Oral)   Ht 5\' 6"  (1.676 m)   Wt 177 lb 3.2 oz (80.4 kg)   SpO2 99%   BMI 28.60 kg/m    Physical Exam Constitutional:      Appearance: Normal appearance.  HENT:     Right Ear: Tympanic membrane, ear canal and external ear normal.     Left Ear: Tympanic membrane, ear canal and external ear normal.  Cardiovascular:     Rate and Rhythm: Normal rate and regular rhythm.     Pulses: Normal pulses.     Heart sounds: Normal heart sounds.  Pulmonary:     Effort: Pulmonary effort is normal.     Breath sounds: Normal breath sounds.  Abdominal:     General: Abdomen is flat. Bowel sounds are normal.     Palpations: Abdomen is soft.  Skin:    General: Skin is warm and dry.  Neurological:     General: No focal deficit present.     Mental Status: He is alert and oriented to person, place, and time.  Psychiatric:        Mood and Affect: Mood normal.        Behavior: Behavior normal.       Last depression screening scores PHQ 2/9 Scores 09/01/2020 07/21/2020 05/24/2020  PHQ - 2 Score 0 0 0  PHQ- 9 Score 2 3 0   Last fall risk screening Fall Risk  09/01/2020  Falls in the past year? 0  Number falls in past yr: 0  Injury with Fall? 0  Risk for fall due to : No Fall Risks  Follow up Falls evaluation completed   Last Audit-C alcohol use screening Alcohol Use Disorder Test (AUDIT) 09/01/2020  1. How often  do you have a drink containing alcohol? 0  2. How many drinks containing alcohol do you have on a typical day when you are drinking? 0  3. How often do you have six or more drinks on one occasion? 0  AUDIT-C Score 0   A score of 3 or more in women, and 4 or more in men indicates increased risk for alcohol abuse, EXCEPT if all of the points are from question 1  No results found for any visits on 09/01/20.  Assessment & Plan    Routine Health Maintenance and Physical Exam  Exercise Activities and Dietary recommendations Goals   None     Immunization History  Administered Date(s) Administered  . Influenza,inj,Quad PF,6+ Mos 02/24/2019  . Influenza-Unspecified 03/31/2020  . Tdap 10/19/2019  . Zoster Recombinat (Shingrix) 06/02/2019, 10/19/2019    Health Maintenance  Topic Date Due  . COLONOSCOPY (Pts 45-20yrs Insurance coverage will need to be confirmed)  04/12/2024  . TETANUS/TDAP  10/18/2029  . INFLUENZA VACCINE  Completed  . Hepatitis C Screening  Completed  . HIV Screening  Completed  . HPV VACCINES  Aged Out    Discussed health benefits of physical activity, and encouraged him to engage in regular exercise appropriate for his age and condition.  1. Annual physical exam  - TSH - Lipid panel - Comprehensive metabolic panel - CBC with Differential/Platelet - PSA  2. Essential hypertension  Continue medications.   3. Benign prostatic hyperplasia with weak urinary stream  Continue medications.   4. Prostate cancer screening  - PSA  5. Hyperlipidemia, unspecified hyperlipidemia type  Continue medications.   Return in about 1 year (around 09/01/2021) for CPE and HTN .     ITrinna Post, [name], have reviewed all documentation for this visit. The documentation on 09/01/20 for the exam, diagnosis, procedures, and orders are all accurate and complete.  The entirety of the information documented in the History of Present Illness, Review of Systems and  Physical Exam were personally obtained by me. Portions of this information were initially documented by [facility] and reviewed by me for thoroughness and accuracy.     [name]  [facility] [phone] (phone) [phone] (fax)  [place]

## 2020-09-01 NOTE — Patient Instructions (Signed)

## 2020-09-02 LAB — LIPID PANEL
Chol/HDL Ratio: 4.8 ratio (ref 0.0–5.0)
Cholesterol, Total: 184 mg/dL (ref 100–199)
HDL: 38 mg/dL — ABNORMAL LOW (ref >39)
LDL Chol Calc (NIH): 119 mg/dL — ABNORMAL HIGH (ref 0–99)
Triglycerides: 148 mg/dL (ref 0–149)
VLDL Cholesterol Cal: 27 mg/dL (ref 5–40)

## 2020-09-02 LAB — COMPREHENSIVE METABOLIC PANEL
ALT: 26 IU/L (ref 0–44)
AST: 26 IU/L (ref 0–40)
Albumin/Globulin Ratio: 1.8 (ref 1.2–2.2)
Albumin: 4.5 g/dL (ref 3.8–4.8)
Alkaline Phosphatase: 100 IU/L (ref 44–121)
BUN/Creatinine Ratio: 23 (ref 10–24)
BUN: 22 mg/dL (ref 8–27)
Bilirubin Total: 0.7 mg/dL (ref 0.0–1.2)
CO2: 23 mmol/L (ref 20–29)
Calcium: 9.8 mg/dL (ref 8.6–10.2)
Chloride: 100 mmol/L (ref 96–106)
Creatinine, Ser: 0.97 mg/dL (ref 0.76–1.27)
Globulin, Total: 2.5 g/dL (ref 1.5–4.5)
Glucose: 96 mg/dL (ref 65–99)
Potassium: 4.5 mmol/L (ref 3.5–5.2)
Sodium: 141 mmol/L (ref 134–144)
Total Protein: 7 g/dL (ref 6.0–8.5)
eGFR: 87 mL/min/{1.73_m2} (ref >59)

## 2020-09-02 LAB — CBC WITH DIFFERENTIAL/PLATELET
Basophils Absolute: 0.1 10*3/uL (ref 0.0–0.2)
Basos: 1 %
EOS (ABSOLUTE): 0.2 10*3/uL (ref 0.0–0.4)
Eos: 3 %
Hematocrit: 42.3 % (ref 37.5–51.0)
Hemoglobin: 14.4 g/dL (ref 13.0–17.7)
Immature Grans (Abs): 0.1 10*3/uL (ref 0.0–0.1)
Immature Granulocytes: 1 %
Lymphocytes Absolute: 2.1 10*3/uL (ref 0.7–3.1)
Lymphs: 25 %
MCH: 30.9 pg (ref 26.6–33.0)
MCHC: 34 g/dL (ref 31.5–35.7)
MCV: 91 fL (ref 79–97)
Monocytes Absolute: 0.7 10*3/uL (ref 0.1–0.9)
Monocytes: 8 %
Neutrophils Absolute: 5.3 10*3/uL (ref 1.4–7.0)
Neutrophils: 62 %
Platelets: 280 10*3/uL (ref 150–450)
RBC: 4.66 x10E6/uL (ref 4.14–5.80)
RDW: 12.4 % (ref 11.6–15.4)
WBC: 8.5 10*3/uL (ref 3.4–10.8)

## 2020-09-02 LAB — PSA: Prostate Specific Ag, Serum: 3.7 ng/mL (ref 0.0–4.0)

## 2020-09-02 LAB — TSH: TSH: 1.76 u[IU]/mL (ref 0.450–4.500)

## 2020-09-03 ENCOUNTER — Encounter: Payer: Self-pay | Admitting: Urology

## 2020-09-06 ENCOUNTER — Other Ambulatory Visit: Payer: Self-pay | Admitting: Physician Assistant

## 2020-09-06 DIAGNOSIS — M1990 Unspecified osteoarthritis, unspecified site: Secondary | ICD-10-CM

## 2020-09-06 NOTE — Telephone Encounter (Signed)
Requested Prescriptions  Pending Prescriptions Disp Refills  . naproxen (NAPROSYN) 500 MG tablet [Pharmacy Med Name: NAPROXEN 500 MG TABLET] 90 tablet 3    Sig: TAKE 1 TABLET BY MOUTH DAILY AS NEEDED.     Analgesics:  NSAIDS Passed - 09/06/2020  1:25 AM      Passed - Cr in normal range and within 360 days    Creatinine, Ser  Date Value Ref Range Status  09/01/2020 0.97 0.76 - 1.27 mg/dL Final         Passed - HGB in normal range and within 360 days    Hemoglobin  Date Value Ref Range Status  09/01/2020 14.4 13.0 - 17.7 g/dL Final         Passed - Patient is not pregnant      Passed - Valid encounter within last 12 months    Recent Outpatient Visits          5 days ago Annual physical exam   [place], [place], [name]   1 month ago Insomnia, unspecified type   [place], [place], [place]   3 months ago Essential hypertension   [place], [place], [place]   4 months ago Orthostatic hypotension   [place], [place], [place]   10 months ago Hordeolum externum of right lower eyelid   [facility] [place], [place], [place]

## 2020-09-07 ENCOUNTER — Telehealth: Payer: Self-pay

## 2020-09-07 NOTE — Telephone Encounter (Signed)
I called patient and patient verbalized understanding of information below.   

## 2020-09-07 NOTE — Telephone Encounter (Signed)
-----   Message from Mar Daring, [place] sent at 09/07/2020  1:14 PM EDT ----- Clair Gulling,  Cholesterol did increase just slightly compared to last check, but is still within normal range. All other labs are normal.  Best wishes, Grace Bushy, [facility]

## 2020-09-08 DIAGNOSIS — M542 Cervicalgia: Secondary | ICD-10-CM | POA: Diagnosis not present

## 2020-09-14 ENCOUNTER — Telehealth: Payer: Self-pay | Admitting: Physician Assistant

## 2020-09-14 DIAGNOSIS — M1990 Unspecified osteoarthritis, unspecified site: Secondary | ICD-10-CM

## 2020-09-14 MED ORDER — NAPROXEN 500 MG PO TABS: 500.0000 mg | ORAL_TABLET | Freq: Every day | ORAL | 3 refills | Status: DC | PRN

## 2020-09-14 NOTE — Telephone Encounter (Signed)
Medication reordered under Dr. Brita Romp. Previously ordered under Adriana Pollak,PA.

## 2020-09-18 DIAGNOSIS — M1711 Unilateral primary osteoarthritis, right knee: Secondary | ICD-10-CM | POA: Diagnosis not present

## 2020-09-23 DIAGNOSIS — M542 Cervicalgia: Secondary | ICD-10-CM | POA: Diagnosis not present

## 2020-10-09 DIAGNOSIS — M25561 Pain in right knee: Secondary | ICD-10-CM | POA: Diagnosis not present

## 2020-11-10 ENCOUNTER — Other Ambulatory Visit: Payer: Self-pay | Admitting: Physician Assistant

## 2020-11-10 MED ORDER — SIMVASTATIN 40 MG PO TABS: 40.0000 mg | ORAL_TABLET | Freq: Every day | ORAL | 2 refills | Status: DC

## 2020-11-10 NOTE — Telephone Encounter (Signed)
Medication Refill - Medication: Simvastatin 40 mg  Has the patient contacted their pharmacy? No.  He wasn't sure if he could call the pharmacy since Adriana hs left. (Agent: If no, request that the patient contact the pharmacy for the refill.) (Agent: If yes, when and what did the pharmacy advise?)  Preferred Pharmacy (with phone number or street name): [facility]  Agent: Please be advised that RX refills may take up to 3 business days. We ask that you follow-up with your pharmacy.

## 2020-11-30 ENCOUNTER — Telehealth: Payer: Self-pay

## 2020-11-30 MED ORDER — SIMVASTATIN 40 MG PO TABS: 40.0000 mg | ORAL_TABLET | Freq: Every day | ORAL | 1 refills | Status: DC

## 2020-11-30 NOTE — Telephone Encounter (Signed)
[facility] refill request for the following medications:  simvastatin (ZOCOR) 40 MG tablet    Please advise.

## 2020-12-07 ENCOUNTER — Other Ambulatory Visit: Payer: Self-pay

## 2020-12-07 ENCOUNTER — Telehealth: Payer: Self-pay

## 2020-12-07 DIAGNOSIS — I10 Essential (primary) hypertension: Secondary | ICD-10-CM

## 2020-12-07 MED ORDER — LOSARTAN POTASSIUM 100 MG PO TABS: 1.0000 | ORAL_TABLET | Freq: Every day | ORAL | 1 refills | Status: DC

## 2020-12-07 NOTE — Telephone Encounter (Signed)
[facility] faxed refill request for the following medications:  losartan (COZAAR) 100 MG tablet  Last Rx: 06/04/20 QTY: 90 Refills: 1 LOV: 09/01/20 Please advise. Thanks TNP

## 2021-01-18 ENCOUNTER — Encounter: Payer: Self-pay | Admitting: Dermatology

## 2021-01-18 ENCOUNTER — Ambulatory Visit: Payer: Medicare HMO | Admitting: Dermatology

## 2021-01-18 ENCOUNTER — Other Ambulatory Visit: Payer: Self-pay

## 2021-01-18 DIAGNOSIS — L219 Seborrheic dermatitis, unspecified: Secondary | ICD-10-CM

## 2021-01-18 DIAGNOSIS — Z86018 Personal history of other benign neoplasm: Secondary | ICD-10-CM

## 2021-01-18 DIAGNOSIS — Z1283 Encounter for screening for malignant neoplasm of skin: Secondary | ICD-10-CM | POA: Diagnosis not present

## 2021-01-18 DIAGNOSIS — L820 Inflamed seborrheic keratosis: Secondary | ICD-10-CM | POA: Diagnosis not present

## 2021-01-18 DIAGNOSIS — L821 Other seborrheic keratosis: Secondary | ICD-10-CM

## 2021-01-18 DIAGNOSIS — L814 Other melanin hyperpigmentation: Secondary | ICD-10-CM

## 2021-01-18 DIAGNOSIS — D229 Melanocytic nevi, unspecified: Secondary | ICD-10-CM

## 2021-01-18 DIAGNOSIS — D1800 Hemangioma unspecified site: Secondary | ICD-10-CM

## 2021-01-18 DIAGNOSIS — L720 Epidermal cyst: Secondary | ICD-10-CM | POA: Diagnosis not present

## 2021-01-18 DIAGNOSIS — L578 Other skin changes due to chronic exposure to nonionizing radiation: Secondary | ICD-10-CM

## 2021-01-18 MED ORDER — HYDROCORTISONE 2.5 % EX LOTN: TOPICAL_LOTION | CUTANEOUS | 6 refills | Status: DC

## 2021-01-18 MED ORDER — KETOCONAZOLE 2 % EX CREA: 1.0000 "application " | TOPICAL_CREAM | CUTANEOUS | 11 refills | Status: AC

## 2021-01-18 NOTE — Progress Notes (Signed)
Follow-Up Visit   Subjective  Brett Wilson is a 65 y.o. male who presents for the following: Total body skin exam (Hx of Dysplastic Nevi). The patient presents for Total-Body Skin Exam (TBSE) for skin cancer screening and mole check.  The following portions of the chart were reviewed this encounter and updated as appropriate:   Tobacco  Allergies  Meds  Problems  Med Hx  Surg Hx  Fam Hx     Review of Systems:  No other skin or systemic complaints except as noted in HPI or Assessment and Plan.  Objective  Well appearing patient in no apparent distress; mood and affect are within normal limits.  A full examination was performed including scalp, head, eyes, ears, nose, lips, neck, chest, axillae, abdomen, back, buttocks, bilateral upper extremities, bilateral lower extremities, hands, feet, fingers, toes, fingernails, and toenails. All findings within normal limits unless otherwise noted below.  multiple Scars with no evidence of recurrence.   midline forehead 1.0cm cystic pap with dilated pore  Head - Anterior (Face) Pink patches with greasy scale.   upper back x 1, Total = 1 Erythematous keratotic or waxy stuck-on papule or plaque.    Assessment & Plan   Lentigines - Scattered tan macules - Due to sun exposure - Benign-appering, observe - Recommend daily broad spectrum sunscreen SPF 30+ to sun-exposed areas, reapply every 2 hours as needed. - Call for any changes  Seborrheic Keratoses - Stuck-on, waxy, tan-brown papules and/or plaques  - Benign-appearing - Discussed benign etiology and prognosis. - Observe - Call for any changes  Melanocytic Nevi - Tan-brown and/or pink-flesh-colored symmetric macules and papules - Benign appearing on exam today - Observation - Call clinic for new or changing moles - Recommend daily use of broad spectrum spf 30+ sunscreen to sun-exposed areas.   Hemangiomas - Red papules - Discussed benign nature - Observe - Call for  any changes  Actinic Damage - Chronic condition, secondary to cumulative UV/sun exposure - diffuse scaly erythematous macules with underlying dyspigmentation - Recommend daily broad spectrum sunscreen SPF 30+ to sun-exposed areas, reapply every 2 hours as needed.  - Staying in the shade or wearing long sleeves, sun glasses (UVA+UVB protection) and wide brim hats (4-inch brim around the entire circumference of the hat) are also recommended for sun protection.  - Call for new or changing lesions.  Skin cancer screening performed today.  History of dysplastic nevus multiple  Clear. Observe for recurrence. Call clinic for new or changing lesions.  Recommend regular skin exams, daily broad-spectrum spf 30+ sunscreen use, and photoprotection.    Epidermal cyst midline forehead  Benign, discussed excising.  Seborrheic dermatitis Head - Anterior (Face)  Seborrheic Dermatitis  -  is a chronic persistent rash characterized by pinkness and scaling most commonly of the mid face but also can occur on the scalp (dandruff), ears; mid chest and mid back. It tends to be exacerbated by stress and cooler weather.  People who have neurologic disease may experience new onset or exacerbation of existing seborrheic dermatitis.  The condition is not curable but treatable and can be controlled.  Start Ketoconazole 2% cream 3 nights/wk, Monday, Wednesday, Friday Start HC 2.5% lotion 3 nights/wk, Tuesday, Thursday and Saturday  ketoconazole (NIZORAL) 2 % cream - Head - Anterior (Face) Apply 1 application topically 3 (three) times a week. Apply to aa face 3 nights weekly, Monday, Wednesday, Friday  hydrocortisone 2.5 % lotion - Head - Anterior (Face) Apply topically 3 (three) times a  week. Apply to aa face 3 nights weekly, Tuesday, Thursday and Saturday  Inflamed seborrheic keratosis upper back x 1, Total = 1  Destruction of lesion - upper back x 1, Total = 1 Complexity: simple   Destruction method:  cryotherapy   Informed consent: discussed and consent obtained   Timeout:  patient name, date of birth, surgical site, and procedure verified Lesion destroyed using liquid nitrogen: Yes   Region frozen until ice ball extended beyond lesion: Yes   Outcome: patient tolerated procedure well with no complications   Post-procedure details: wound care instructions given    Skin cancer screening  Return in about 1 year (around 01/18/2022) for TBSE, Hx of Dysplastic nevi.  I, Othelia Pulling, RMA, am acting as scribe for Sarina Ser, MD . Documentation: I have reviewed the above documentation for accuracy and completeness, and I agree with the above.  Sarina Ser, MD

## 2021-01-18 NOTE — Patient Instructions (Signed)

## 2021-03-22 ENCOUNTER — Encounter: Payer: Self-pay | Admitting: Family Medicine

## 2021-03-22 ENCOUNTER — Ambulatory Visit: Payer: Medicare HMO | Admitting: Family Medicine

## 2021-03-22 ENCOUNTER — Other Ambulatory Visit: Payer: Self-pay

## 2021-03-22 VITALS — BP 148/94 | HR 86 | Wt 174.0 lb

## 2021-03-22 DIAGNOSIS — Z23 Encounter for immunization: Secondary | ICD-10-CM | POA: Diagnosis not present

## 2021-03-22 DIAGNOSIS — N401 Enlarged prostate with lower urinary tract symptoms: Secondary | ICD-10-CM

## 2021-03-22 DIAGNOSIS — R3912 Poor urinary stream: Secondary | ICD-10-CM | POA: Diagnosis not present

## 2021-03-22 DIAGNOSIS — I10 Essential (primary) hypertension: Secondary | ICD-10-CM

## 2021-03-22 NOTE — Progress Notes (Signed)
Established patient visit   Patient: Brett Wilson   DOB: 01-04-1956   65 y.o. Male  MRN: [id number] Visit Date: 03/22/2021  Today's healthcare provider: Wilhemena Durie, MD   No chief complaint on file.  Subjective    HPI  Patient comes in today for follow-up.  Blood pressures as an outpatient are mainly 130s to 160 range over 80s Hypertension, follow-up  BP Readings from Last 3 Encounters:  03/22/21 (!) 148/94  09/01/20 (!) 134/97  07/21/20 (!) 148/86   Wt Readings from Last 3 Encounters:  03/22/21 174 lb (78.9 kg)  09/01/20 177 lb 3.2 oz (80.4 kg)  07/21/20 180 lb 8 oz (81.9 kg)     He was last seen for hypertension 6 months ago.  BP at that visit was 134/97. Management since that visit includes no changes.  He reports excellent compliance with treatment. He is not having side effects.  He is following a Low Sodium diet. He is exercising. He does not smoke.  Use of agents associated with hypertension: none.   Outside blood pressures are have been elevated at home. Symptoms: No chest pain No chest pressure  No palpitations No syncope  No dyspnea No orthopnea  No paroxysmal nocturnal dyspnea No lower extremity edema   Pertinent labs: Lab Results  Component Value Date   CHOL 184 09/01/2020   HDL 38 (L) 09/01/2020   LDLCALC 119 (H) 09/01/2020   TRIG 148 09/01/2020   CHOLHDL 4.8 09/01/2020   Lab Results  Component Value Date   NA 141 09/01/2020   K 4.5 09/01/2020   CREATININE 0.97 09/01/2020   EGFR 87 09/01/2020   GFRNONAA >60 11/29/2019   GLUCOSE 96 09/01/2020     The 10-year ASCVD risk score (Arnett DK, et al., 2019) is: 20.6%   ---------------------------------------------------------------------------------------------------     Medications: Outpatient Medications Prior to Visit  Medication Sig   cyclobenzaprine (FLEXERIL) 5 MG tablet Take 1 tablet (5 mg total) by mouth at bedtime.   finasteride (PROSCAR) 5 MG tablet Take 1 tablet (5  mg total) by mouth at bedtime.   hydrocortisone 2.5 % lotion Apply topically 3 (three) times a week. Apply to aa face 3 nights weekly, Tuesday, Thursday and Saturday   losartan (COZAAR) 100 MG tablet Take 1 tablet (100 mg total) by mouth daily.   Multiple Vitamin (MULTIVITAMIN WITH MINERALS) TABS tablet Take 1 tablet by mouth daily.   naproxen (NAPROSYN) 500 MG tablet Take 1 tablet (500 mg total) by mouth daily as needed.   simvastatin (ZOCOR) 40 MG tablet Take 1 tablet (40 mg total) by mouth daily.   Zinc 50 MG TABS Take 50 mg by mouth daily.   zolpidem (AMBIEN) 5 MG tablet Take 1 tablet (5 mg total) by mouth at bedtime as needed for sleep.   No facility-administered medications prior to visit.    Review of Systems  Constitutional: Negative.   Respiratory: Negative.    Cardiovascular: Negative.   Gastrointestinal: Negative.   Neurological:  Negative for dizziness, syncope, speech difficulty, weakness, light-headedness, numbness and headaches.      Objective    BP (!) 148/94 (BP Location: Left Arm, Patient Position: Sitting, Cuff Size: Large)   Pulse 86   Wt 174 lb (78.9 kg)   SpO2 99%   BMI 28.08 kg/m    Physical Exam Vitals reviewed.  Constitutional:      General: He is not in acute distress.    Appearance: He is well-developed.  HENT:     Head: Normocephalic and atraumatic.     Right Ear: Hearing normal.     Left Ear: Hearing normal.     Nose: Nose normal.  Eyes:     General: Lids are normal. No scleral icterus.       Right eye: No discharge.        Left eye: No discharge.     Conjunctiva/sclera: Conjunctivae normal.  Cardiovascular:     Rate and Rhythm: Normal rate and regular rhythm.     Heart sounds: Normal heart sounds.  Pulmonary:     Effort: Pulmonary effort is normal. No respiratory distress.  Skin:    Findings: No lesion or rash.  Neurological:     General: No focal deficit present.     Mental Status: He is alert and oriented to person, place, and  time.  Psychiatric:        Mood and Affect: Mood normal.        Speech: Speech normal.        Behavior: Behavior normal.        Thought Content: Thought content normal.        Judgment: Judgment normal.      No results found for any visits on 03/22/21.  Assessment & Plan     1. Need for influenza vaccination  - Flu Vaccine QUAD High Dose(Fluad)  2. Essential hypertension Consider adding amlodipine 5 mg daily  3. Benign prostatic hyperplasia with weak urinary stream Finasteride daily   No follow-ups on file.      I, Wilhemena Durie, MD, have reviewed all documentation for this visit. The documentation on 03/25/21 for the exam, diagnosis, procedures, and orders are all accurate and complete.    Ac Colan Cranford Mon, MD  [facility] [phone] (phone) [phone] (fax)  [place]

## 2021-04-05 ENCOUNTER — Telehealth: Payer: Self-pay | Admitting: *Deleted

## 2021-04-05 ENCOUNTER — Ambulatory Visit: Payer: Self-pay | Admitting: *Deleted

## 2021-04-05 NOTE — Telephone Encounter (Signed)
Pt. Reports he started having right lower abdominal pain 5 days ago. Pain is now constant. Radiates into his back and right testicle. No vomiting, diarrhea or urinary symptoms. Has nausea with the pain.No availability in the practice per Arbie Cookey. Pt. Will go to [facility].

## 2021-04-05 NOTE — Telephone Encounter (Signed)
I attempted to return this pt's call however every time I call the number the line disconnects before it rings.   Only contact number listed for him.  He had called into [facility] c/o right lower quadrant pain for 5 days abd pain.  No swelling or tenderness in area per the agent's note that took the call.   I have forwarded this to [facility] for their information and attempt.

## 2021-04-05 NOTE — Telephone Encounter (Signed)
Copied from [place] [id number]. Topic: Appointment Scheduling - Scheduling Inquiry for Clinic >> Apr 05, 2021  9:18 AM Brett Wilson A wrote: Reason for CRM: The patient has had right lower quadrant discomfort for roughly 5 days and would like to be seen by Prov. Rollene Rotunda today  Please contact to advise scheduling

## 2021-04-05 NOTE — Telephone Encounter (Signed)
Reason for Disposition . [1] MILD-MODERATE pain AND [2] constant AND [3] present > 2 hours  Answer Assessment - Initial Assessment Questions 1. LOCATION: "Where does it hurt?"      Right lower abd. 2. RADIATION: "Does the pain shoot anywhere else?" (e.g., chest, back)     Low back 3. ONSET: "When did the pain begin?" (Minutes, hours or days ago)      5 days ago 4. SUDDEN: "Gradual or sudden onset?"     Sudden 5. PATTERN "Does the pain come and go, or is it constant?"    - If constant: "Is it getting better, staying the same, or worsening?"      (Note: Constant means the pain never goes away completely; most serious pain is constant and it progresses)     - If intermittent: "How long does it last?" "Do you have pain now?"     (Note: Intermittent means the pain goes away completely between bouts)     Constant 6. SEVERITY: "How bad is the pain?"  (e.g., Scale 1-10; mild, moderate, or severe)    - MILD (1-3): doesn't interfere with normal activities, abdomen soft and not tender to touch     - MODERATE (4-7): interferes with normal activities or awakens from sleep, abdomen tender to touch     - SEVERE (8-10): excruciating pain, doubled over, unable to do any normal activities       Now - 6 7. RECURRENT SYMPTOM: "Have you ever had this type of stomach pain before?" If Yes, ask: "When was the last time?" and "What happened that time?"      Yes 8. CAUSE: "What do you think is causing the stomach pain?"     Unsure 9. RELIEVING/AGGRAVATING FACTORS: "What makes it better or worse?" (e.g., movement, antacids, bowel movement)     No 10. OTHER SYMPTOMS: "Do you have any other symptoms?" (e.g., back pain, diarrhea, fever, urination pain, vomiting)       Nausea with pain  Protocols used: Abdominal Pain - Male-A-AH

## 2021-05-31 ENCOUNTER — Other Ambulatory Visit: Payer: Self-pay | Admitting: Family Medicine

## 2021-05-31 DIAGNOSIS — I10 Essential (primary) hypertension: Secondary | ICD-10-CM

## 2021-06-22 ENCOUNTER — Ambulatory Visit: Payer: Medicare HMO | Admitting: Physician Assistant

## 2021-06-22 ENCOUNTER — Other Ambulatory Visit: Payer: Self-pay | Admitting: Urology

## 2021-06-22 DIAGNOSIS — N138 Other obstructive and reflux uropathy: Secondary | ICD-10-CM

## 2021-06-25 ENCOUNTER — Encounter: Payer: Self-pay | Admitting: Physician Assistant

## 2021-06-28 ENCOUNTER — Encounter: Payer: Self-pay | Admitting: Urology

## 2021-06-28 DIAGNOSIS — N138 Other obstructive and reflux uropathy: Secondary | ICD-10-CM

## 2021-06-28 DIAGNOSIS — N401 Enlarged prostate with lower urinary tract symptoms: Secondary | ICD-10-CM

## 2021-06-28 MED ORDER — FINASTERIDE 5 MG PO TABS
5.0000 mg | ORAL_TABLET | Freq: Every day | ORAL | 3 refills | Status: DC
Start: 2021-06-28 — End: 2022-12-30

## 2021-08-05 ENCOUNTER — Encounter: Payer: Self-pay | Admitting: Family Medicine

## 2021-08-06 MED ORDER — SIMVASTATIN 40 MG PO TABS: 40.0000 mg | ORAL_TABLET | Freq: Every day | ORAL | 0 refills | Status: DC

## 2021-08-22 ENCOUNTER — Encounter: Payer: Medicare HMO | Admitting: Physician Assistant

## 2021-09-04 NOTE — Progress Notes (Signed)
I,Brett Wilson,acting as a Neurosurgeon for [employer], [name].,have documented all relevant documentation on the behalf of Brett Ferguson, [name],as directed by  Brett Ferguson, [name] while in the presence of Brett Ferguson, [name].    Welcome to Medicare Preventive Visit     Patient: Brett Wilson, Male    DOB: 05-04-1956, 66 y.o.   MRN: [id number] Visit Date: 09/05/2021  Today's Provider: [name], [name]   Chief Complaint  Patient presents with   welcome to medicare   Subjective    [place] Brett Wilson is a 66 y.o. male who presents today for his Annual Wellness Visit. Feels well. Brings BP readings from home, 110/63 or around that range.    Medications: Outpatient Medications Prior to Visit  Medication Sig   aspirin EC 81 MG tablet Take 81 mg by mouth daily. Swallow whole.   cyclobenzaprine (FLEXERIL) 5 MG tablet Take 1 tablet (5 mg total) by mouth at bedtime.   finasteride (PROSCAR) 5 MG tablet Take 1 tablet (5 mg total) by mouth at bedtime.   hydrocortisone 2.5 % lotion Apply topically 3 (three) times a week. Apply to aa face 3 nights weekly, Tuesday, Thursday and Saturday   losartan (COZAAR) 100 MG tablet TAKE 1 TABLET BY MOUTH EVERY DAY   metoprolol succinate (TOPROL-XL) 25 MG 24 hr tablet Take 25 mg by mouth daily.   Multiple Vitamin (MULTIVITAMIN WITH MINERALS) TABS tablet Take 1 tablet by mouth daily.   naproxen (NAPROSYN) 500 MG tablet Take 1 tablet (500 mg total) by mouth daily as needed.   rosuvastatin (CRESTOR) 10 MG tablet Take 10 mg by mouth daily.   Zinc 50 MG TABS Take 50 mg by mouth daily.   [DISCONTINUED] zolpidem (AMBIEN) 5 MG tablet Take 1 tablet (5 mg total) by mouth at bedtime as needed for sleep.   [DISCONTINUED] simvastatin (ZOCOR) 40 MG tablet Take 1 tablet (40 mg total) by mouth daily.   No facility-administered medications prior to visit.    Allergies  Allergen Reactions   Penicillins Hives    Patient Care Team: Brett Ferguson, [name] as PCP -  General (Physician Assistant)  Review of Systems  Genitourinary:  Positive for frequency.  All other systems reviewed and are negative.  Last CBC Lab Results  Component Value Date   WBC 8.5 09/01/2020   HGB 14.4 09/01/2020   HCT 42.3 09/01/2020   MCV 91 09/01/2020   MCH 30.9 09/01/2020   RDW 12.4 09/01/2020   PLT 280 09/01/2020   Last metabolic panel Lab Results  Component Value Date   GLUCOSE 96 09/01/2020   NA 141 09/01/2020   K 4.5 09/01/2020   CL 100 09/01/2020   CO2 23 09/01/2020   BUN 22 09/01/2020   CREATININE 0.97 09/01/2020   EGFR 87 09/01/2020   CALCIUM 9.8 09/01/2020   PROT 7.0 09/01/2020   ALBUMIN 4.5 09/01/2020   LABGLOB 2.5 09/01/2020   AGRATIO 1.8 09/01/2020   BILITOT 0.7 09/01/2020   ALKPHOS 100 09/01/2020   AST 26 09/01/2020   ALT 26 09/01/2020   ANIONGAP 16 (H) 11/29/2019   Last lipids Lab Results  Component Value Date   CHOL 184 09/01/2020   HDL 38 (L) 09/01/2020   LDLCALC 119 (H) 09/01/2020   TRIG 148 09/01/2020   CHOLHDL 4.8 09/01/2020   Last thyroid functions Lab Results  Component Value Date   TSH 1.760 09/01/2020        Objective    Vitals: BP 110/63 Comment: home value  Pulse 80   Resp 16   Ht 5\' 7"  (1.702 m)   Wt 176 lb 6.4 oz (80 kg)   SpO2 96%   BMI 27.63 kg/m  BP Readings from Last 3 Encounters:  09/05/21 110/63  03/22/21 (!) 148/94  09/01/20 (!) 134/97   Wt Readings from Last 3 Encounters:  09/05/21 176 lb 6.4 oz (80 kg)  03/22/21 174 lb (78.9 kg)  09/01/20 177 lb 3.2 oz (80.4 kg)   Physical Exam Constitutional:      General: He is awake.     Appearance: He is well-developed.  HENT:     Head: Normocephalic.     Right Ear: Tympanic membrane, ear canal and external ear normal.     Left Ear: Tympanic membrane, ear canal and external ear normal.     Nose: Nose normal. No congestion or rhinorrhea.     Mouth/Throat:     Mouth: Mucous membranes are moist.     Pharynx: No oropharyngeal exudate or posterior  oropharyngeal erythema.  Eyes:     Pupils: Pupils are equal, round, and reactive to light.  Cardiovascular:     Rate and Rhythm: Normal rate and regular rhythm.     Heart sounds: Normal heart sounds.  Pulmonary:     Effort: Pulmonary effort is normal.     Breath sounds: Normal breath sounds.  Abdominal:     General: There is no distension.     Palpations: Abdomen is soft.     Tenderness: There is no abdominal tenderness. There is no guarding.  Musculoskeletal:     Cervical back: Normal range of motion.     Right lower leg: No edema.     Left lower leg: No edema.  Lymphadenopathy:     Cervical: No cervical adenopathy.  Skin:    General: Skin is warm.  Neurological:     Mental Status: He is alert and oriented to person, place, and time.  Psychiatric:        Attention and Perception: Attention normal.        Mood and Affect: Mood normal.        Speech: Speech normal.        Behavior: Behavior normal. Behavior is cooperative.   Most recent functional status assessment:    09/05/2021    9:53 AM  In your present state of health, do you have any difficulty performing the following activities:  Hearing? 0  Vision? 0  Difficulty concentrating or making decisions? 0  Walking or climbing stairs? 0  Dressing or bathing? 0  Doing errands, shopping? 0   Most recent fall risk assessment:    09/05/2021    9:53 AM  Fall Risk   Falls in the past year? 0  Number falls in past yr: 0  Injury with Fall? 0  Risk for fall due to : No Fall Risks  Follow up Falls evaluation completed    Most recent depression screenings:    09/05/2021    9:53 AM 03/22/2021   10:56 AM  PHQ 2/9 Scores  PHQ - 2 Score 0 0  PHQ- 9 Score 0 1   Most recent cognitive screening:     View : No data to display.         Most recent Audit-C alcohol use screening    09/05/2021    9:53 AM  Alcohol Use Disorder Test (AUDIT)  1. How often do you have a drink containing alcohol? 1  2. How many drinks  containing  alcohol do you have on a typical day when you are drinking? 0  3. How often do you have six or more drinks on one occasion? 0  AUDIT-C Score 1   A score of 3 or more in women, and 4 or more in men indicates increased risk for alcohol abuse, EXCEPT if all of the points are from question 1   No results found for any visits on 09/05/21.  Assessment & Plan     Annual wellness visit done today including the all of the following: Reviewed patient's Family Medical History Reviewed and updated list of patient's medical providers Assessment of cognitive impairment was done Assessed patient's functional ability Established a written schedule for health screening services Health Risk Assessent Completed and Reviewed  Exercise Activities and Dietary recommendations --balanced diet high in fiber and protein, low in sugars, carbs, fats. --physical activity/exercise 30 minutes 3-5 times a week    Immunization History  Administered Date(s) Administered   Fluad Quad(high Dose 65+) 03/22/2021   Influenza,inj,Quad PF,6+ Mos 02/24/2019   Influenza-Unspecified 03/31/2020   PFIZER Comirnaty(Gray Top)Covid-19 Tri-Sucrose Vaccine 09/06/2019, 09/27/2019   PNEUMOCOCCAL CONJUGATE-20 09/05/2021   Pfizer Covid-19 Vaccine Bivalent Booster 26yrs & up 11/22/2020, 04/13/2021   Tdap 10/19/2019   Zoster Recombinat (Shingrix) 06/02/2019, 10/19/2019    Health Maintenance  Topic Date Due   COLONOSCOPY (Pts 45-42yrs Insurance coverage will need to be confirmed)  04/12/2024   TETANUS/TDAP  10/18/2029   Pneumonia Vaccine 59+ Years old  Completed   INFLUENZA VACCINE  Completed   COVID-19 Vaccine  Completed   Hepatitis C Screening  Completed   HIV Screening  Completed   Zoster Vaccines- Shingrix  Completed   HPV VACCINES  Aged Out     Discussed health benefits of physical activity, and encouraged him to engage in regular exercise appropriate for his age and condition.    Problem List Items  Addressed This Visit       Cardiovascular and Mediastinum   White coat syndrome with diagnosis of hypertension    Currently managed with losartan 100 mg and metoprolol succinate 25 mg Managed by cardiologist Brings home values, recorded in vitals. All < 130/90      Relevant Medications   rosuvastatin (CRESTOR) 10 MG tablet   metoprolol succinate (TOPROL-XL) 25 MG 24 hr tablet   aspirin EC 81 MG tablet     Genitourinary   Benign prostatic hyperplasia with weak urinary stream    Stable, s/p urolift procedure 2 years ago Managed with finasteride 5 mg       Relevant Orders   PSA     Other   Hyperlipidemia    Will recheck lipid panel for stability Was taking simvastatin 40 mg, swapped to crestor 10 mg. Currently taking 81 mg ASA daily. The 10-year ASCVD risk score (Arnett DK, et al., 2019) is: 24.7%       Relevant Medications   rosuvastatin (CRESTOR) 10 MG tablet   metoprolol succinate (TOPROL-XL) 25 MG 24 hr tablet   aspirin EC 81 MG tablet   Other Relevant Orders   Lipid Profile   Welcome to Medicare preventive visit - Primary    Will check routine labs EKG today normal sinus w/o ST elevation, T wave abnormalities      Relevant Orders   EKG 12-Lead (Completed)   Comprehensive Metabolic Panel (CMET)   Lipid Profile   CBC   PSA   HgB A1c   Other Visit Diagnoses     Need for pneumococcal  vaccination       Relevant Orders   Pneumococcal conjugate vaccine 20-valent (Completed)       I, Brett Ferguson, [name] have reviewed all documentation for this visit. The documentation on  09/05/2021 for the exam, diagnosis, procedures, and orders are all accurate and complete.  Brett Ferguson, [name] [facility] [address] [place], [place], [address] Office: [phone] Fax: [phone]   [facility]

## 2021-09-05 ENCOUNTER — Ambulatory Visit (INDEPENDENT_AMBULATORY_CARE_PROVIDER_SITE_OTHER): Payer: Medicare HMO | Admitting: Physician Assistant

## 2021-09-05 ENCOUNTER — Encounter: Payer: Self-pay | Admitting: Physician Assistant

## 2021-09-05 ENCOUNTER — Other Ambulatory Visit: Payer: Self-pay

## 2021-09-05 VITALS — BP 110/63 | HR 80 | Resp 16 | Ht 67.0 in | Wt 176.4 lb

## 2021-09-05 DIAGNOSIS — N401 Enlarged prostate with lower urinary tract symptoms: Secondary | ICD-10-CM

## 2021-09-05 DIAGNOSIS — I10 Essential (primary) hypertension: Secondary | ICD-10-CM

## 2021-09-05 DIAGNOSIS — E785 Hyperlipidemia, unspecified: Secondary | ICD-10-CM | POA: Diagnosis not present

## 2021-09-05 DIAGNOSIS — Z0000 Encounter for general adult medical examination without abnormal findings: Secondary | ICD-10-CM | POA: Insufficient documentation

## 2021-09-05 DIAGNOSIS — Z23 Encounter for immunization: Secondary | ICD-10-CM | POA: Diagnosis not present

## 2021-09-05 DIAGNOSIS — R3912 Poor urinary stream: Secondary | ICD-10-CM

## 2021-09-05 NOTE — Assessment & Plan Note (Addendum)
Will recheck lipid panel for stability ?Was taking simvastatin 40 mg, swapped to crestor 10 mg. ?Currently taking 81 mg ASA daily. ?The 10-year ASCVD risk score (Arnett DK, et al., 2019) is: 24.7% ? ?

## 2021-09-05 NOTE — Assessment & Plan Note (Addendum)
Stable, s/p urolift procedure 2 years ago ?Managed with finasteride 5 mg  ?

## 2021-09-05 NOTE — Assessment & Plan Note (Signed)
Will check routine labs ?EKG today normal sinus w/o ST elevation, T wave abnormalities ?

## 2021-09-05 NOTE — Assessment & Plan Note (Addendum)
Currently managed with losartan 100 mg and metoprolol succinate 25 mg ?Managed by cardiologist ?Brings home values, recorded in vitals. All < 130/90 ?

## 2021-09-06 LAB — CBC
Hematocrit: 42.9 % (ref 37.5–51.0)
Hemoglobin: 15 g/dL (ref 13.0–17.7)
MCH: 32 pg (ref 26.6–33.0)
MCHC: 35 g/dL (ref 31.5–35.7)
MCV: 92 fL (ref 79–97)
Platelets: 270 10*3/uL (ref 150–450)
RBC: 4.69 x10E6/uL (ref 4.14–5.80)
RDW: 12.4 % (ref 11.6–15.4)
WBC: 9.2 10*3/uL (ref 3.4–10.8)

## 2021-09-06 LAB — COMPREHENSIVE METABOLIC PANEL
ALT: 26 IU/L (ref 0–44)
AST: 26 IU/L (ref 0–40)
Albumin/Globulin Ratio: 2.1 (ref 1.2–2.2)
Albumin: 4.7 g/dL (ref 3.8–4.8)
Alkaline Phosphatase: 109 IU/L (ref 44–121)
BUN/Creatinine Ratio: 16 (ref 10–24)
BUN: 17 mg/dL (ref 8–27)
Bilirubin Total: 0.7 mg/dL (ref 0.0–1.2)
CO2: 29 mmol/L (ref 20–29)
Calcium: 10.2 mg/dL (ref 8.6–10.2)
Chloride: 100 mmol/L (ref 96–106)
Creatinine, Ser: 1.05 mg/dL (ref 0.76–1.27)
Globulin, Total: 2.2 g/dL (ref 1.5–4.5)
Glucose: 99 mg/dL (ref 70–99)
Potassium: 4.8 mmol/L (ref 3.5–5.2)
Sodium: 141 mmol/L (ref 134–144)
Total Protein: 6.9 g/dL (ref 6.0–8.5)
eGFR: 79 mL/min/{1.73_m2} (ref >59)

## 2021-09-06 LAB — LIPID PANEL
Chol/HDL Ratio: 3.7 ratio (ref 0.0–5.0)
Cholesterol, Total: 168 mg/dL (ref 100–199)
HDL: 45 mg/dL (ref >39)
LDL Chol Calc (NIH): 102 mg/dL — ABNORMAL HIGH (ref 0–99)
Triglycerides: 119 mg/dL (ref 0–149)
VLDL Cholesterol Cal: 21 mg/dL (ref 5–40)

## 2021-09-06 LAB — HEMOGLOBIN A1C
Est. average glucose Bld gHb Est-mCnc: 123 mg/dL
Hgb A1c MFr Bld: 5.9 % — ABNORMAL HIGH (ref 4.8–5.6)

## 2021-09-06 LAB — PSA: Prostate Specific Ag, Serum: 2.3 ng/mL (ref 0.0–4.0)

## 2021-09-10 DIAGNOSIS — I5189 Other ill-defined heart diseases: Secondary | ICD-10-CM

## 2021-09-10 HISTORY — DX: Other ill-defined heart diseases: I51.89

## 2021-11-02 ENCOUNTER — Other Ambulatory Visit: Payer: Self-pay | Admitting: Physician Assistant

## 2021-11-13 ENCOUNTER — Other Ambulatory Visit: Payer: Self-pay | Admitting: Family Medicine

## 2021-11-13 DIAGNOSIS — M1990 Unspecified osteoarthritis, unspecified site: Secondary | ICD-10-CM

## 2021-11-21 ENCOUNTER — Other Ambulatory Visit: Payer: Self-pay | Admitting: Family Medicine

## 2021-11-21 DIAGNOSIS — I10 Essential (primary) hypertension: Secondary | ICD-10-CM

## 2021-12-13 ENCOUNTER — Encounter (HOSPITAL_COMMUNITY): Payer: Self-pay

## 2021-12-13 ENCOUNTER — Other Ambulatory Visit: Payer: Self-pay | Admitting: Cardiology

## 2021-12-13 DIAGNOSIS — R079 Chest pain, unspecified: Secondary | ICD-10-CM

## 2021-12-13 DIAGNOSIS — I209 Angina pectoris, unspecified: Secondary | ICD-10-CM

## 2021-12-13 DIAGNOSIS — E782 Mixed hyperlipidemia: Secondary | ICD-10-CM

## 2021-12-14 ENCOUNTER — Telehealth (HOSPITAL_COMMUNITY): Payer: Self-pay | Admitting: *Deleted

## 2021-12-14 NOTE — Telephone Encounter (Signed)
Reaching out to patient to offer assistance regarding upcoming cardiac imaging study; pt verbalizes understanding of appt date/time, parking situation and where to check in, pre-test NPO status and medications ordered, and verified current allergies; name and call back number provided for further questions should they arise  Gordy Clement RN Navigator Cardiac [place] [facility] [phone] office [phone] cell  Patient asked to take an extra '25mg'$  metoprolol succiante tablet but was uncomfortable doing so. He is willing to take an additional half tablet two hours prior to his cardiac CT scan.

## 2021-12-17 ENCOUNTER — Ambulatory Visit
Admission: RE | Admit: 2021-12-17 | Discharge: 2021-12-17 | Disposition: A | Discharge status: Home / Self Care | Payer: Medicare HMO | Source: Ambulatory Visit | Attending: Cardiology | Admitting: Cardiology

## 2021-12-17 DIAGNOSIS — R931 Abnormal findings on diagnostic imaging of heart and coronary circulation: Secondary | ICD-10-CM | POA: Insufficient documentation

## 2021-12-17 DIAGNOSIS — E782 Mixed hyperlipidemia: Secondary | ICD-10-CM | POA: Diagnosis present

## 2021-12-17 DIAGNOSIS — I209 Angina pectoris, unspecified: Secondary | ICD-10-CM | POA: Insufficient documentation

## 2021-12-17 DIAGNOSIS — I2510 Atherosclerotic heart disease of native coronary artery without angina pectoris: Secondary | ICD-10-CM

## 2021-12-17 DIAGNOSIS — R079 Chest pain, unspecified: Secondary | ICD-10-CM | POA: Diagnosis present

## 2021-12-17 LAB — POCT I-STAT CREATININE: Creatinine, Ser: 1 mg/dL (ref 0.61–1.24)

## 2021-12-17 MED ORDER — NITROGLYCERIN 0.4 MG SL SUBL
0.8000 mg | SUBLINGUAL_TABLET | Freq: Once | SUBLINGUAL | Status: AC
  Administered 2021-12-17: 0.8 mg via SUBLINGUAL

## 2021-12-17 MED ORDER — IOHEXOL 350 MG/ML SOLN
75.0000 mL | Freq: Once | INTRAVENOUS | Status: AC | PRN
  Administered 2021-12-17: 75 mL via INTRAVENOUS

## 2021-12-17 MED ORDER — METOPROLOL TARTRATE 5 MG/5ML IV SOLN
10.0000 mg | Freq: Once | INTRAVENOUS | Status: AC
  Administered 2021-12-17: 10 mg via INTRAVENOUS

## 2021-12-17 NOTE — Progress Notes (Signed)
Patient tolerated procedure well. Ambulate w/o difficulty. Denies any lightheadedness or being dizzy. Pt denies any pain at this time. Sitting in chair, drinking water provided. Pt is encouraged to drink additional water throughout the day and reason explained to patient. Patient verbalized understanding and all questions answered. ABC intact. No further needs at this time. Discharge from procedure area w/o issues.  

## 2021-12-19 ENCOUNTER — Other Ambulatory Visit: Payer: Self-pay | Admitting: Cardiology

## 2021-12-19 DIAGNOSIS — R931 Abnormal findings on diagnostic imaging of heart and coronary circulation: Secondary | ICD-10-CM

## 2021-12-19 DIAGNOSIS — I2510 Atherosclerotic heart disease of native coronary artery without angina pectoris: Secondary | ICD-10-CM

## 2021-12-19 HISTORY — DX: Atherosclerotic heart disease of native coronary artery without angina pectoris: I25.10

## 2021-12-20 DIAGNOSIS — R931 Abnormal findings on diagnostic imaging of heart and coronary circulation: Secondary | ICD-10-CM

## 2021-12-26 ENCOUNTER — Other Ambulatory Visit: Payer: Self-pay

## 2021-12-26 ENCOUNTER — Ambulatory Visit
Admission: RE | Admit: 2021-12-26 | Discharge: 2021-12-26 | Disposition: A | Discharge status: Home / Self Care | Payer: Medicare HMO | Attending: Cardiology | Admitting: Cardiology

## 2021-12-26 ENCOUNTER — Encounter: Payer: Self-pay | Admitting: Cardiology

## 2021-12-26 ENCOUNTER — Encounter
Admission: RE | Disposition: A | Discharge status: Home / Self Care | Payer: Self-pay | Source: Home / Self Care | Attending: Cardiology

## 2021-12-26 DIAGNOSIS — I10 Essential (primary) hypertension: Secondary | ICD-10-CM | POA: Insufficient documentation

## 2021-12-26 DIAGNOSIS — E785 Hyperlipidemia, unspecified: Secondary | ICD-10-CM | POA: Insufficient documentation

## 2021-12-26 DIAGNOSIS — I25119 Atherosclerotic heart disease of native coronary artery with unspecified angina pectoris: Secondary | ICD-10-CM | POA: Diagnosis not present

## 2021-12-26 DIAGNOSIS — I2510 Atherosclerotic heart disease of native coronary artery without angina pectoris: Secondary | ICD-10-CM

## 2021-12-26 DIAGNOSIS — I208 Other forms of angina pectoris: Secondary | ICD-10-CM | POA: Diagnosis present

## 2021-12-26 DIAGNOSIS — I209 Angina pectoris, unspecified: Secondary | ICD-10-CM | POA: Diagnosis present

## 2021-12-26 DIAGNOSIS — R9439 Abnormal result of other cardiovascular function study: Secondary | ICD-10-CM | POA: Diagnosis not present

## 2021-12-26 HISTORY — DX: Atherosclerotic heart disease of native coronary artery without angina pectoris: I25.10

## 2021-12-26 HISTORY — PX: CORONARY STENT INTERVENTION: CATH118234

## 2021-12-26 HISTORY — PX: INTRAVASCULAR ULTRASOUND/IVUS: CATH118244

## 2021-12-26 HISTORY — PX: LEFT HEART CATH AND CORONARY ANGIOGRAPHY: CATH118249

## 2021-12-26 LAB — POCT ACTIVATED CLOTTING TIME
Activated Clotting Time: 269 seconds
Activated Clotting Time: 299 seconds

## 2021-12-26 SURGERY — LEFT HEART CATH AND CORONARY ANGIOGRAPHY
Anesthesia: Moderate Sedation

## 2021-12-26 MED ORDER — ONDANSETRON HCL 4 MG/2ML IJ SOLN: 4.0000 mg | Freq: Four times a day (QID) | INTRAMUSCULAR | Status: DC | PRN

## 2021-12-26 MED ORDER — SODIUM CHLORIDE 0.9% FLUSH: 3.0000 mL | INTRAVENOUS | Status: DC | PRN

## 2021-12-26 MED ORDER — SODIUM CHLORIDE 0.9 % IV SOLN: INTRAVENOUS | Status: DC

## 2021-12-26 MED ORDER — LIDOCAINE HCL 1 % IJ SOLN
INTRAMUSCULAR | Status: AC
  Filled 2021-12-26: qty 20

## 2021-12-26 MED ORDER — ASPIRIN 81 MG PO CHEW
CHEWABLE_TABLET | ORAL | Status: AC
  Filled 2021-12-26: qty 3

## 2021-12-26 MED ORDER — SODIUM CHLORIDE 0.9% FLUSH: 3.0000 mL | Freq: Two times a day (BID) | INTRAVENOUS | Status: DC

## 2021-12-26 MED ORDER — ACETAMINOPHEN 325 MG PO TABS: 650.0000 mg | ORAL_TABLET | ORAL | Status: DC | PRN

## 2021-12-26 MED ORDER — ASPIRIN 81 MG PO CHEW
324.0000 mg | CHEWABLE_TABLET | ORAL | Status: AC
  Administered 2021-12-26: 243 mg via ORAL

## 2021-12-26 MED ORDER — SODIUM CHLORIDE 0.9 % IV SOLN: INTRAVENOUS | Status: AC

## 2021-12-26 MED ORDER — HEPARIN SODIUM (PORCINE) 1000 UNIT/ML IJ SOLN
INTRAMUSCULAR | Status: DC | PRN
  Administered 2021-12-26: 2000 [IU] via INTRAVENOUS
  Administered 2021-12-26: 4000 [IU] via INTRAVENOUS
  Administered 2021-12-26: 2000 [IU] via INTRAVENOUS
  Administered 2021-12-26: 4000 [IU] via INTRAVENOUS

## 2021-12-26 MED ORDER — LIDOCAINE HCL (PF) 1 % IJ SOLN
INTRAMUSCULAR | Status: DC | PRN
  Administered 2021-12-26: 2 mL

## 2021-12-26 MED ORDER — FENTANYL CITRATE (PF) 100 MCG/2ML IJ SOLN
INTRAMUSCULAR | Status: DC | PRN
  Administered 2021-12-26: 25 ug via INTRAVENOUS

## 2021-12-26 MED ORDER — VERAPAMIL HCL 2.5 MG/ML IV SOLN
INTRAVENOUS | Status: AC
  Filled 2021-12-26: qty 2

## 2021-12-26 MED ORDER — HEPARIN (PORCINE) IN NACL 1000-0.9 UT/500ML-% IV SOLN
INTRAVENOUS | Status: DC | PRN
  Administered 2021-12-26 (×2): 500 mL

## 2021-12-26 MED ORDER — IOHEXOL 300 MG/ML  SOLN
INTRAMUSCULAR | Status: DC | PRN
  Administered 2021-12-26: 135 mL

## 2021-12-26 MED ORDER — HEPARIN (PORCINE) IN NACL 1000-0.9 UT/500ML-% IV SOLN
INTRAVENOUS | Status: AC
  Filled 2021-12-26: qty 1000

## 2021-12-26 MED ORDER — SODIUM CHLORIDE 0.9 % IV SOLN: 250.0000 mL | INTRAVENOUS | Status: DC | PRN

## 2021-12-26 MED ORDER — MIDAZOLAM HCL 2 MG/2ML IJ SOLN
INTRAMUSCULAR | Status: DC | PRN
  Administered 2021-12-26: 1 mg via INTRAVENOUS

## 2021-12-26 MED ORDER — FENTANYL CITRATE (PF) 100 MCG/2ML IJ SOLN
INTRAMUSCULAR | Status: AC
  Filled 2021-12-26: qty 2

## 2021-12-26 MED ORDER — VERAPAMIL HCL 2.5 MG/ML IV SOLN
INTRAVENOUS | Status: DC | PRN
  Administered 2021-12-26: 2.5 mg via INTRA_ARTERIAL

## 2021-12-26 MED ORDER — CLOPIDOGREL BISULFATE 75 MG PO TABS: 75.0000 mg | ORAL_TABLET | Freq: Every day | ORAL | Status: DC

## 2021-12-26 MED ORDER — CLOPIDOGREL BISULFATE 75 MG PO TABS: 75.0000 mg | ORAL_TABLET | Freq: Every day | ORAL | 3 refills | Status: AC

## 2021-12-26 MED ORDER — CLOPIDOGREL BISULFATE 75 MG PO TABS
ORAL_TABLET | ORAL | Status: DC | PRN
  Administered 2021-12-26: 600 mg via ORAL

## 2021-12-26 MED ORDER — HEPARIN SODIUM (PORCINE) 1000 UNIT/ML IJ SOLN
INTRAMUSCULAR | Status: AC
  Filled 2021-12-26: qty 10

## 2021-12-26 MED ORDER — ROSUVASTATIN CALCIUM 40 MG PO TABS: 40.0000 mg | ORAL_TABLET | Freq: Every day | ORAL | 3 refills | Status: AC

## 2021-12-26 MED ORDER — CLOPIDOGREL BISULFATE 75 MG PO TABS
ORAL_TABLET | ORAL | Status: AC
  Filled 2021-12-26: qty 8

## 2021-12-26 MED ORDER — MIDAZOLAM HCL 2 MG/2ML IJ SOLN
INTRAMUSCULAR | Status: AC
  Filled 2021-12-26: qty 2

## 2021-12-26 SURGICAL SUPPLY — 22 items
BALLN TREK RX 2.25X12 (BALLOONS) ×2
BALLN ~~LOC~~ EUPHORA RX 3.0X15 (BALLOONS) ×2
BALLOON TREK RX 2.25X12 (BALLOONS) IMPLANT
BALLOON ~~LOC~~ EUPHORA RX 3.0X15 (BALLOONS) IMPLANT
BAND ZEPHYR COMPRESS 30 LONG (HEMOSTASIS) ×1 IMPLANT
CATH 5FR JL3.5 JR4 ANG PIG MP (CATHETERS) ×1 IMPLANT
CATH EAGLE EYE PLAT IMAGING (CATHETERS) ×1 IMPLANT
CATH LAUNCHER 6FR EBU3.5 (CATHETERS) ×1 IMPLANT
DRAPE BRACHIAL (DRAPES) ×1 IMPLANT
GLIDESHEATH SLEND SS 6F .021 (SHEATH) ×1 IMPLANT
GUIDEWIRE INQWIRE 1.5J.035X260 (WIRE) IMPLANT
INQWIRE 1.5J .035X260CM (WIRE) ×2
KIT ENCORE 26 ADVANTAGE (KITS) ×1 IMPLANT
KIT SYRINGE INJ CVI SPIKEX1 (MISCELLANEOUS) ×1 IMPLANT
PACK CARDIAC CATH (CUSTOM PROCEDURE TRAY) ×2 IMPLANT
PROTECTION STATION PRESSURIZED (MISCELLANEOUS) ×2
SET ATX SIMPLICITY (MISCELLANEOUS) ×1 IMPLANT
STATION PROTECTION PRESSURIZED (MISCELLANEOUS) IMPLANT
STENT ONYX FRONTIER 2.75X26 (Permanent Stent) ×1 IMPLANT
TUBING CIL FLEX 10 FLL-RA (TUBING) ×1 IMPLANT
VALVE COPILOT STAT (MISCELLANEOUS) ×1 IMPLANT
WIRE RUNTHROUGH .014X180CM (WIRE) ×1 IMPLANT

## 2021-12-26 NOTE — H&P (Signed)
[facility] Cardiology History and Physical  Patient ID: Brett Wilson MRN: [id number] DOB/AGE: Aug 01, 1955 66 y.o. Admit date: 12/26/2021  Primary Care Physician: [name], [name] Primary Cardiologist Andrez Grime, MD   HPI:  Brett Wilson is a 66 year old male with history of hypertension, hyperlipidemia who has been experiencing chest pain and shortness of breath with exertion.  He underwent a cardiac CTA which showed a high-grade stenosis in his LAD for which she is referred for heart catheterization.  He is a very active person but has had limitation recently in his exercise capacity.    Past Medical History:  Diagnosis Date   Allergy    Dysplastic nevus 01/17/2020   Left mid side. Severe atypia with scar, close to margin. exc 03/14/2020   Dysplastic nevus 01/17/2020   Left calf. Moderate atypia, deep margin involved.    Dysplastic nevus 01/17/2020   LUQ abdomen. Mild atypia, limited margins free.    Hyperlipidemia    Hypertension    Pituitary adenoma [facility])     Past Surgical History:  Procedure Laterality Date   CATARACT EXTRACTION     COLONOSCOPY WITH PROPOFOL N/A 04/13/2019   Procedure: COLONOSCOPY WITH PROPOFOL;  Surgeon: Lucilla Lame, MD;  Location: [facility] ENDOSCOPY;  Service: Endoscopy;  Laterality: N/A;   CYSTOSCOPY WITH INSERTION OF UROLIFT     CYSTOSCOPY WITH INSERTION OF UROLIFT N/A 11/29/2019   Procedure: CYSTOSCOPY WITH INSERTION OF UROLIFT;  Surgeon: Hollice Espy, MD;  Location: [facility] ORS;  Service: Urology;  Laterality: N/A;   HOLEP-LASER ENUCLEATION OF THE PROSTATE WITH MORCELLATION N/A 11/29/2019   Procedure: HOLEP-LASER ENUCLEATION OF THE PROSTATE;  Surgeon: Hollice Espy, MD;  Location: [facility] ORS;  Service: Urology;  Laterality: N/A;   PITUITARY EXCISION     TONSILLECTOMY      Medications Prior to Admission  Medication Sig Dispense Refill Last Dose   aspirin EC 81 MG tablet Take 81 mg by mouth daily. Swallow whole.   12/26/2021   finasteride (PROSCAR) 5  MG tablet Take 1 tablet (5 mg total) by mouth at bedtime. 90 tablet 3 12/25/2021   losartan (COZAAR) 100 MG tablet TAKE 1 TABLET BY MOUTH EVERY DAY 90 tablet 1 12/26/2021   metoprolol succinate (TOPROL-XL) 25 MG 24 hr tablet Take 25 mg by mouth daily.   12/26/2021   Multiple Vitamin (MULTIVITAMIN WITH MINERALS) TABS tablet Take 1 tablet by mouth daily.   12/25/2021   rosuvastatin (CRESTOR) 10 MG tablet Take 10 mg by mouth daily.   12/26/2021   Zinc 50 MG TABS Take 50 mg by mouth daily.   12/25/2021   cyclobenzaprine (FLEXERIL) 5 MG tablet Take 1 tablet (5 mg total) by mouth at bedtime. (Patient not taking: Reported on 12/26/2021) 30 tablet 2 Not Taking   hydrocortisone 2.5 % lotion Apply topically 3 (three) times a week. Apply to aa face 3 nights weekly, Tuesday, Thursday and Saturday 59 mL 6    naproxen (NAPROSYN) 500 MG tablet TAKE 1 TABLET BY MOUTH DAILY AS NEEDED. 90 tablet 3    simvastatin (ZOCOR) 40 MG tablet TAKE 1 TABLET BY MOUTH EVERY DAY (Patient not taking: Reported on 12/26/2021) 90 tablet 0 Not Taking   Social History   Socioeconomic History   Marital status: Married    Spouse name: Not on file   Number of children: Not on file   Years of education: Not on file   Highest education level: Not on file  Occupational History   Not on file  Tobacco Use  Smoking status: Never   Smokeless tobacco: Never  Vaping Use   Vaping Use: Never used  Substance and Sexual Activity   Alcohol use: Not Currently   Drug use: Never   Sexual activity: Not on file  Other Topics Concern   Not on file  Social History Narrative   Not on file   Social Determinants of Health   Financial Resource Strain: Not on file  Food Insecurity: Not on file  Transportation Needs: Not on file  Physical Activity: Not on file  Stress: Not on file  Social Connections: Not on file  Intimate Partner Violence: Not on file    Family History  Problem Relation Age of Onset   Hypertension Mother    Colon cancer  Father       Review of systems complete and found to be negative unless listed above      Physical Exam:  General: Well developed, well nourished, in no acute distress HEENT:  Normocephalic and atramatic Neck:  No JVD.  Lungs: Clear bilaterally to auscultation and percussion. Heart: HRRR . Normal S1 and S2 without gallops or murmurs.  Abdomen: Bowel sounds are positive, abdomen soft and non-tender  Msk:  Back normal, normal gait. Normal strength and tone for age. Extremities: No clubbing, cyanosis or edema.   Neuro: Alert and oriented X 3. Psych:  Good affect, responds appropriately   Labs:   Lab Results  Component Value Date   WBC 9.2 09/05/2021   HGB 15.0 09/05/2021   HCT 42.9 09/05/2021   MCV 92 09/05/2021   PLT 270 09/05/2021   No results for input(s): "NA", "K", "CL", "CO2", "BUN", "CREATININE", "CALCIUM", "PROT", "BILITOT", "ALKPHOS", "ALT", "AST", "GLUCOSE" in the last 168 hours.  Invalid input(s): "LABALBU" No results found for: "CKTOTAL", "CKMB", "CKMBINDEX", "TROPONINI"  Lab Results  Component Value Date   CHOL 168 09/05/2021   CHOL 184 09/01/2020   CHOL 179 09/01/2019   Lab Results  Component Value Date   HDL 45 09/05/2021   HDL 38 (L) 09/01/2020   HDL 43 09/01/2019   Lab Results  Component Value Date   LDLCALC 102 (H) 09/05/2021   LDLCALC 119 (H) 09/01/2020   LDLCALC 102 (H) 09/01/2019   Lab Results  Component Value Date   TRIG 119 09/05/2021   TRIG 148 09/01/2020   TRIG 197 (H) 09/01/2019   Lab Results  Component Value Date   CHOLHDL 3.7 09/05/2021   CHOLHDL 4.8 09/01/2020   CHOLHDL 4.2 09/01/2019   No results found for: "LDLDIRECT"    Radiology: CT CORONARY FRACTIONAL FLOW RESERVE DATA PREP  Result Date: 12/20/2021 EXAM: CT FFR ANALYSIS CLINICAL DATA:  Abnormal CCTA FINDINGS: FFRct analysis was performed on the original cardiac CT angiogram dataset. Diagrammatic representation of the FFRct analysis is provided in a separate PDF  document in PACS. This dictation was created using the PDF document and an interactive 3D model of the results. 3D model is not available in the EMR/PACS. Normal FFR range is >0.80. 1. Left Main:  No significant stenosis. 2. LAD: significant stenosis in the mid LAD noted.  FFRct 0.69 3. LCX: No significant stenosis.  FFRct 0.89 4. RCA: Not evaluated due to significant motion artifacts. IMPRESSION: 1. CT FFR analysis showed significant stenosis in the mid LAD, FFRct 0.69. 2.  Analysis not performed in the RCA due to motion artifacts. 3.  Cardiac catheterization recommended. Electronically Signed   By: Kate Sable M.D.   On: 12/20/2021 14:50   CT CORONARY  MORPH W/CTA COR W/SCORE W/CA W/CM &/OR WO/CM  Addendum Date: 12/19/2021   ADDENDUM REPORT: 12/19/2021 09:17 EXAM: OVER-READ INTERPRETATION  CT CHEST The following report is an over-read performed by radiologist Dr. Norlene Duel [facility] on 12/19/2021. This over-read does not include interpretation of cardiac or coronary anatomy or pathology. The coronary calcium score and coronary CTA interpretation by the cardiologist is attached. COMPARISON:  None. FINDINGS: Vascular: No acute abnormality. Mediastinum/nodes: No enlarged lymph nodes or mediastinal mass identified. Lungs/pleura: No pleural effusion, airspace consolidation, atelectasis, or pneumothorax. No suspicious lung nodules. Upper abdomen: There are multiple subcentimeter 1 low-attenuation foci within the imaged portions of the dome of liver. These are all too small to reliably characterize. Musculoskeletal: No acute or suspicious osseous findings. Remote healed left posterolateral rib fracture, image 23/20. IMPRESSION: 1. No significant, noncardiac supplemental findings. Electronically Signed   By: Kerby Moors M.D.   On: 12/19/2021 09:17   Result Date: 12/19/2021 CLINICAL DATA:  Chest pain EXAM: Cardiac/Coronary  CTA TECHNIQUE: The patient was scanned on a Siemens Somatom go.Top  scanner. : A retrospective scan was triggered in the descending thoracic aorta. Axial non-contrast 3 mm slices were carried out through the heart. The data set was analyzed on a dedicated work station and scored using the [place]. Gantry rotation speed was 330 msecs and collimation was .6 mm. '100mg'$  of metoprolol and 0.8 mg of sl NTG was given. The 3D data set was reconstructed in 5% intervals of the 60-95 % of the R-R cycle. Diastolic phases were analyzed on a dedicated work station using MPR, MIP and VRT modes. The patient received 75 cc of contrast. FINDINGS: Aorta: Normal size. Mild descending aorta calcifications. No dissection. Aortic Valve:  Trileaflet.  No calcifications. Coronary Arteries:  Normal coronary origin.  Right dominance. RCA is a dominant artery that gives rise to PDA and PLA. There is calcified plaque in the proximal segment causing mild stenosis (25%). Left main gives rise to LAD and LCX arteries. There is no LM disease. LAD has calcified and non calcified plaque in the proximal segment causing severe stenosis (>70%). LCX is a non-dominant artery that gives rise to three obtuse marginal branches. There is no plaque. Other findings: Normal pulmonary vein drainage into the left atrium. Normal left atrial appendage without a thrombus. Normal size of the pulmonary artery. IMPRESSION: 1. Coronary calcium score of 220. This was 66th percentile for age and sex matched control. 2. Normal coronary origin with right dominance. 3. Calcified and non calcified plaque in the proximal LAD segment causing severe stenosis (>70%). 4. Calcified plaque causing mild proximal RCA stenosis. 5. CAD-RADS 4 Severe stenosis. (70-99% or > 50% left main). Cardiac catheterization is recommended. Consider symptom-guided anti-ischemic pharmacotherapy as well as risk factor modification per guideline directed care. 6. Additional analysis with CT FFR will be submitted and reported separately. Electronically Signed: By:  Kate Sable M.D. On: 12/17/2021 16:54    EKG: NSR. Normal  ASSESSMENT AND PLAN:  66 year old male with history of hypertension and hyperlipidemia who has been experiencing typical angina.  Cardiac CTA shows high-grade stenosis in his LAD.  We are proceeding with heart catheterization for further evaluation.  The risk and benefits were discussed with the patient who is agreeable to proceed.  We will attempt a right radial artery approach.  Signed: Andrez Grime MD 12/26/2021, 7:58 AM

## 2021-12-27 ENCOUNTER — Encounter: Payer: Self-pay | Admitting: Cardiology

## 2022-01-03 DIAGNOSIS — I2510 Atherosclerotic heart disease of native coronary artery without angina pectoris: Secondary | ICD-10-CM | POA: Insufficient documentation

## 2022-01-04 ENCOUNTER — Encounter: Payer: Medicare HMO | Attending: Cardiology | Admitting: *Deleted

## 2022-01-04 DIAGNOSIS — Z955 Presence of coronary angioplasty implant and graft: Secondary | ICD-10-CM

## 2022-01-04 NOTE — Progress Notes (Signed)
Virtual orientation call completed today. he has an appointment on Date: 01/21/2022  for EP eval and gym Orientation.  Documentation of diagnosis can be found in [facility]  12/26/2021.

## 2022-01-21 ENCOUNTER — Encounter: Payer: Medicare HMO | Attending: Cardiology | Admitting: *Deleted

## 2022-01-21 VITALS — Ht 68.8 in | Wt 169.8 lb

## 2022-01-21 DIAGNOSIS — Z955 Presence of coronary angioplasty implant and graft: Secondary | ICD-10-CM | POA: Insufficient documentation

## 2022-01-21 NOTE — Progress Notes (Signed)
Cardiac Individual Treatment Plan  Patient Details  Name: Brett Wilson MRN: [id number] Date of Birth: 28-May-1956 Referring Provider:   Flowsheet Row Cardiac Rehab from 01/21/2022 in [facility] Cardiac and Pulmonary Rehab  Referring Provider Donnelly Angelica MD  [facility] Cardiolgoist: Dr. Sheppard Coil Paraschos]       Initial Encounter Date:  Flowsheet Row Cardiac Rehab from 01/21/2022 in [facility] Cardiac and Pulmonary Rehab  Date 01/21/22       Visit Diagnosis: Status post coronary artery stent placement  Patient's Home Medications on Admission:  Current Outpatient Medications:    aspirin EC 81 MG tablet, Take 81 mg by mouth daily. Swallow whole., Disp: , Rfl:    clopidogrel (PLAVIX) 75 MG tablet, Take 1 tablet (75 mg total) by mouth daily., Disp: 90 tablet, Rfl: 3   cyclobenzaprine (FLEXERIL) 5 MG tablet, Take 1 tablet (5 mg total) by mouth at bedtime. (Patient not taking: Reported on 12/26/2021), Disp: 30 tablet, Rfl: 2   finasteride (PROSCAR) 5 MG tablet, Take 1 tablet (5 mg total) by mouth at bedtime., Disp: 90 tablet, Rfl: 3   hydrocortisone 2.5 % lotion, Apply topically 3 (three) times a week. Apply to aa face 3 nights weekly, Tuesday, Thursday and Saturday, Disp: 59 mL, Rfl: 6   losartan (COZAAR) 100 MG tablet, TAKE 1 TABLET BY MOUTH EVERY DAY, Disp: 90 tablet, Rfl: 1   metoprolol succinate (TOPROL-XL) 25 MG 24 hr tablet, Take 25 mg by mouth daily., Disp: , Rfl:    Multiple Vitamin (MULTIVITAMIN WITH MINERALS) TABS tablet, Take 1 tablet by mouth daily., Disp: , Rfl:    rosuvastatin (CRESTOR) 40 MG tablet, Take 1 tablet (40 mg total) by mouth daily., Disp: 90 tablet, Rfl: 3   Zinc 50 MG TABS, Take 50 mg by mouth daily., Disp: , Rfl:   Past Medical History: Past Medical History:  Diagnosis Date   Allergy    Dysplastic nevus 01/17/2020   Left mid side. Severe atypia with scar, close to margin. exc 03/14/2020   Dysplastic nevus 01/17/2020   Left calf. Moderate atypia, deep margin involved.     Dysplastic nevus 01/17/2020   LUQ abdomen. Mild atypia, limited margins free.    Hyperlipidemia    Hypertension    Pituitary adenoma ([place])     Tobacco Use: Social History   Tobacco Use  Smoking Status Never  Smokeless Tobacco Never    Labs: Review Flowsheet       Latest Ref Rng & Units 07/22/2018 09/01/2019 09/01/2020 09/05/2021  Labs for ITP Cardiac and Pulmonary Rehab  Cholestrol 100 - 199 mg/dL 183     179  184  168   LDL (calc) 0 - 99 mg/dL 108     102  119  102   HDL-C >39 mg/dL 43     43  38  45   Trlycerides 0 - 149 mg/dL - 197  148  119   Hemoglobin A1c 4.8 - 5.6 % - - - 5.9     Details       This result is from an external source.          Exercise Target Goals: Exercise Program Goal: Individual exercise prescription set using results from initial 6 min walk test and THRR while considering  patient's activity barriers and safety.   Exercise Prescription Goal: Initial exercise prescription builds to 30-45 minutes a day of aerobic activity, 2-3 days per week.  Home exercise guidelines will be given to patient during program as part of exercise  prescription that the participant will acknowledge.   Education: Aerobic Exercise: - Group verbal and visual presentation on the components of exercise prescription. Introduces F.I.T.T principle from ACSM for exercise prescriptions.  Reviews F.I.T.T. principles of aerobic exercise including progression. Written material given at graduation. Flowsheet Row Cardiac Rehab from 01/21/2022 in [facility] Cardiac and Pulmonary Rehab  Education need identified 01/21/22       Education: Resistance Exercise: - Group verbal and visual presentation on the components of exercise prescription. Introduces F.I.T.T principle from ACSM for exercise prescriptions  Reviews F.I.T.T. principles of resistance exercise including progression. Written material given at graduation.    Education: Exercise & Equipment Safety: - Individual verbal  instruction and demonstration of equipment use and safety with use of the equipment. Flowsheet Row Cardiac Rehab from 01/21/2022 in [facility] Cardiac and Pulmonary Rehab  Date 01/21/22  Educator [facility]  Instruction Review Code 1- Verbalizes Understanding       Education: Exercise Physiology & General Exercise Guidelines: - Group verbal and written instruction with models to review the exercise physiology of the cardiovascular system and associated critical values. Provides general exercise guidelines with specific guidelines to those with heart or lung disease.    Education: Flexibility, Balance, Mind/Body Relaxation: - Group verbal and visual presentation with interactive activity on the components of exercise prescription. Introduces F.I.T.T principle from ACSM for exercise prescriptions. Reviews F.I.T.T. principles of flexibility and balance exercise training including progression. Also discusses the mind body connection.  Reviews various relaxation techniques to help reduce and manage stress (i.e. Deep breathing, progressive muscle relaxation, and visualization). Balance handout provided to take home. Written material given at graduation.   Activity Barriers & Risk Stratification:  Activity Barriers & Cardiac Risk Stratification - 01/21/22 1351       Activity Barriers & Cardiac Risk Stratification   Activity Barriers Other (comment);Balance Concerns;Muscular Weakness;Neck/Spine Problems    Comments partially torn L Medial menincus, buldging disc    Cardiac Risk Stratification Moderate             6 Minute Walk:  6 Minute Walk     Row Name 01/21/22 1350         6 Minute Walk   Phase Initial     Distance 1400 feet     Walk Time 6 minutes     # of Rest Breaks 0     MPH 2.65     METS 3.78     RPE 9     VO2 Peak 13.24     Symptoms No     Resting HR 81 bpm     Resting BP 152/76     Resting Oxygen Saturation  99 %     Exercise Oxygen Saturation  during 6 min walk 98 %     Max  Ex. HR 101 bpm     Max Ex. BP 168/82     2 Minute Post BP 154/78              Oxygen Initial Assessment:   Oxygen Re-Evaluation:   Oxygen Discharge (Final Oxygen Re-Evaluation):   Initial Exercise Prescription:  Initial Exercise Prescription - 01/21/22 1300       Date of Initial Exercise RX and Referring Provider   Date 01/21/22    Referring Provider Donnelly Angelica MD   New Cardiolgoist: Dr. Isaias Cowman     Oxygen   Maintain Oxygen Saturation 88% or higher      Treadmill   MPH 2.6    Grade 2  Minutes 15    METs 3.71      Elliptical   Level 1    Speed 3.6    Minutes 15    METs 3.7      REL-XR   Level 3    Speed 50    Minutes 15    METs 3.7      Prescription Details   Frequency (times per week) 3    Duration Progress to 30 minutes of continuous aerobic without signs/symptoms of physical distress      Intensity   THRR 40-80% of Max Heartrate 110-139    Ratings of Perceived Exertion 11-13    Perceived Dyspnea 0-4      Progression   Progression Continue to progress workloads to maintain intensity without signs/symptoms of physical distress.      Resistance Training   Training Prescription Yes    Weight 5 lb    Reps 10-15             Perform Capillary Blood Glucose checks as needed.  Exercise Prescription Changes:   Exercise Prescription Changes     Row Name 01/21/22 1300             Response to Exercise   Blood Pressure (Admit) 152/76       Blood Pressure (Exercise) 168/82       Blood Pressure (Exit) 154/78       Heart Rate (Admit) 81 bpm       Heart Rate (Exercise) 101 bpm       Heart Rate (Exit) 84 bpm       Oxygen Saturation (Admit) 99 %       Oxygen Saturation (Exercise) 98 %       Rating of Perceived Exertion (Exercise) 9       Symptoms none       Comments walk test results                Exercise Comments:   Exercise Goals and Review:   Exercise Goals     Row Name 01/21/22 1400              Exercise Goals   Increase Physical Activity Yes       Intervention Provide advice, education, support and counseling about physical activity/exercise needs.;Develop an individualized exercise prescription for aerobic and resistive training based on initial evaluation findings, risk stratification, comorbidities and participant's personal goals.       Expected Outcomes Short Term: Attend rehab on a regular basis to increase amount of physical activity.;Long Term: Add in home exercise to make exercise part of routine and to increase amount of physical activity.;Long Term: Exercising regularly at least 3-5 days a week.       Increase Strength and Stamina Yes       Intervention Provide advice, education, support and counseling about physical activity/exercise needs.;Develop an individualized exercise prescription for aerobic and resistive training based on initial evaluation findings, risk stratification, comorbidities and participant's personal goals.       Expected Outcomes Short Term: Increase workloads from initial exercise prescription for resistance, speed, and METs.;Short Term: Perform resistance training exercises routinely during rehab and add in resistance training at home;Long Term: Improve cardiorespiratory fitness, muscular endurance and strength as measured by increased METs and functional capacity (6MWT)       Able to understand and use rate of perceived exertion (RPE) scale Yes       Intervention Provide education and explanation on how to use RPE  scale       Expected Outcomes Short Term: Able to use RPE daily in rehab to express subjective intensity level;Long Term:  Able to use RPE to guide intensity level when exercising independently       Able to understand and use Dyspnea scale Yes       Intervention Provide education and explanation on how to use Dyspnea scale       Expected Outcomes Short Term: Able to use Dyspnea scale daily in rehab to express subjective sense of shortness of breath  during exertion;Long Term: Able to use Dyspnea scale to guide intensity level when exercising independently       Knowledge and understanding of Target Heart Rate Range (THRR) Yes       Intervention Provide education and explanation of THRR including how the numbers were predicted and where they are located for reference       Expected Outcomes Short Term: Able to state/look up THRR;Short Term: Able to use daily as guideline for intensity in rehab;Long Term: Able to use THRR to govern intensity when exercising independently       Able to check pulse independently Yes       Intervention Provide education and demonstration on how to check pulse in carotid and radial arteries.;Review the importance of being able to check your own pulse for safety during independent exercise       Expected Outcomes Short Term: Able to explain why pulse checking is important during independent exercise;Long Term: Able to check pulse independently and accurately       Understanding of Exercise Prescription Yes       Intervention Provide education, explanation, and written materials on patient's individual exercise prescription       Expected Outcomes Long Term: Able to explain home exercise prescription to exercise independently;Short Term: Able to explain program exercise prescription                Exercise Goals Re-Evaluation :   Discharge Exercise Prescription (Final Exercise Prescription Changes):  Exercise Prescription Changes - 01/21/22 1300       Response to Exercise   Blood Pressure (Admit) 152/76    Blood Pressure (Exercise) 168/82    Blood Pressure (Exit) 154/78    Heart Rate (Admit) 81 bpm    Heart Rate (Exercise) 101 bpm    Heart Rate (Exit) 84 bpm    Oxygen Saturation (Admit) 99 %    Oxygen Saturation (Exercise) 98 %    Rating of Perceived Exertion (Exercise) 9    Symptoms none    Comments walk test results             Nutrition:  Target Goals: Understanding of nutrition  guidelines, daily intake of sodium '1500mg'$ , cholesterol '200mg'$ , calories 30% from fat and 7% or less from saturated fats, daily to have 5 or more servings of fruits and vegetables.  Education: All About Nutrition: -Group instruction provided by verbal, written material, interactive activities, discussions, models, and posters to present general guidelines for heart healthy nutrition including fat, fiber, MyPlate, the role of sodium in heart healthy nutrition, utilization of the nutrition label, and utilization of this knowledge for meal planning. Follow up email sent as well. Written material given at graduation.   Biometrics:  Pre Biometrics - 01/21/22 1400       Pre Biometrics   Height 5' 8.8" (1.748 m)    Weight 169 lb 12.8 oz (77 kg)    BMI (Calculated) 25.21  Single Leg Stand 15 seconds              Nutrition Therapy Plan and Nutrition Goals:  Nutrition Therapy & Goals - 01/21/22 1401       Intervention Plan   Intervention Prescribe, educate and counsel regarding individualized specific dietary modifications aiming towards targeted core components such as weight, hypertension, lipid management, diabetes, heart failure and other comorbidities.    Expected Outcomes Short Term Goal: Understand basic principles of dietary content, such as calories, fat, sodium, cholesterol and nutrients.;Short Term Goal: A plan has been developed with personal nutrition goals set during dietitian appointment.;Long Term Goal: Adherence to prescribed nutrition plan.             Nutrition Assessments:  MEDIFICTS Score Key: ?70 Need to make dietary changes  40-70 Heart Healthy Diet ? 40 Therapeutic Level Cholesterol Diet  Flowsheet Row Cardiac Rehab from 01/21/2022 in [facility] Cardiac and Pulmonary Rehab  Picture Your Plate Total Score on Admission 73      Picture Your Plate Scores: <70 Unhealthy dietary pattern with much room for improvement. 41-50 Dietary pattern unlikely to meet  recommendations for good health and room for improvement. 51-60 More healthful dietary pattern, with some room for improvement.  >60 Healthy dietary pattern, although there may be some specific behaviors that could be improved.    Nutrition Goals Re-Evaluation:   Nutrition Goals Discharge (Final Nutrition Goals Re-Evaluation):   Psychosocial: Target Goals: Acknowledge presence or absence of significant depression and/or stress, maximize coping skills, provide positive support system. Participant is able to verbalize types and ability to use techniques and skills needed for reducing stress and depression.   Education: Stress, Anxiety, and Depression - Group verbal and visual presentation to define topics covered.  Reviews how body is impacted by stress, anxiety, and depression.  Also discusses healthy ways to reduce stress and to treat/manage anxiety and depression.  Written material given at graduation.   Education: Sleep Hygiene -Provides group verbal and written instruction about how sleep can affect your health.  Define sleep hygiene, discuss sleep cycles and impact of sleep habits. Review good sleep hygiene tips.    Initial Review & Psychosocial Screening:  Initial Psych Review & Screening - 01/04/22 1011       Initial Review   Current issues with None Identified      Family Dynamics   Good Support System? Yes   wife     Barriers   Psychosocial barriers to participate in program There are no identifiable barriers or psychosocial needs.;The patient should benefit from training in stress management and relaxation.      Screening Interventions   Interventions Encouraged to exercise;To provide support and resources with identified psychosocial needs;Provide feedback about the scores to participant    Expected Outcomes Short Term goal: Utilizing psychosocial counselor, staff and physician to assist with identification of specific Stressors or current issues interfering with  healing process. Setting desired goal for each stressor or current issue identified.;Long Term Goal: Stressors or current issues are controlled or eliminated.;Short Term goal: Identification and review with participant of any Quality of Life or Depression concerns found by scoring the questionnaire.;Long Term goal: The participant improves quality of Life and PHQ9 Scores as seen by post scores and/or verbalization of changes             Quality of Life Scores:   Quality of Life - 01/21/22 1401       Quality of Life   Select Quality of  Life      Quality of Life Scores   Health/Function Pre 28.4 %    Socioeconomic Pre 26.25 %    Psych/Spiritual Pre 29.14 %    Family Pre 30 %    GLOBAL Pre 28.29 %            Scores of 19 and below usually indicate a poorer quality of life in these areas.  A difference of  2-3 points is a clinically meaningful difference.  A difference of 2-3 points in the total score of the Quality of Life Index has been associated with significant improvement in overall quality of life, self-image, physical symptoms, and general health in studies assessing change in quality of life.  PHQ-9: Review Flowsheet  More data exists      01/21/2022 09/05/2021 03/22/2021 09/01/2020 07/21/2020  Depression screen PHQ 2/9  Decreased Interest 0 0 0 0 0  Down, Depressed, Hopeless 0 0 0 0 0  PHQ - 2 Score 0 0 0 0 0  Altered sleeping 0 0 '1 1 2  '$ Tired, decreased energy 0 0 0 1 1  Change in appetite 0 0 0 0 0  Feeling bad or failure about yourself  0 0 0 0 0  Trouble concentrating 0 0 0 0 0  Moving slowly or fidgety/restless 0 0 0 0 0  Suicidal thoughts 0 0 0 0 0  PHQ-9 Score 0 0 '1 2 3  '$ Difficult doing work/chores Not difficult at all Not difficult at all Not difficult at all Not difficult at all Not difficult at all   Interpretation of Total Score  Total Score Depression Severity:  1-4 = Minimal depression, 5-9 = Mild depression, 10-14 = Moderate depression, 15-19 =  Moderately severe depression, 20-27 = Severe depression   Psychosocial Evaluation and Intervention:  Psychosocial Evaluation - 01/04/22 1030       Psychosocial Evaluation & Interventions   Interventions Encouraged to exercise with the program and follow exercise prescription    Comments JIm has no barriers to attending the program. He is ready to get started and learn how to manage his heart disease and return to his usual daily activities. He lives with his wife. She is is main support. He has already started losing weight and is working on his nutrition changes.    Expected Outcomes STG Clair Gulling attends all scheduled sessions. He progresses with his exercise and ability to get back to his usual daily activities without concern  LTG Clair Gulling is able to continue his progress after discarge    Continue Psychosocial Services  Follow up required by staff             Psychosocial Re-Evaluation:   Psychosocial Discharge (Final Psychosocial Re-Evaluation):   Vocational Rehabilitation: Provide vocational rehab assistance to qualifying candidates.   Vocational Rehab Evaluation & Intervention:   Education: Education Goals: Education classes will be provided on a variety of topics geared toward better understanding of heart health and risk factor modification. Participant will state understanding/return demonstration of topics presented as noted by education test scores.  Learning Barriers/Preferences:   General Cardiac Education Topics:  AED/CPR: - Group verbal and written instruction with the use of models to demonstrate the basic use of the AED with the basic ABC's of resuscitation.   Anatomy and Cardiac Procedures: - Group verbal and visual presentation and models provide information about basic cardiac anatomy and function. Reviews the testing methods done to diagnose heart disease and the outcomes of the test results. Describes  the treatment choices: Medical Management, Angioplasty, or  Coronary Bypass Surgery for treating various heart conditions including Myocardial Infarction, Angina, Valve Disease, and Cardiac Arrhythmias.  Written material given at graduation. Flowsheet Row Cardiac Rehab from 01/21/2022 in [facility] Cardiac and Pulmonary Rehab  Education need identified 01/21/22       Medication Safety: - Group verbal and visual instruction to review commonly prescribed medications for heart and lung disease. Reviews the medication, class of the drug, and side effects. Includes the steps to properly store meds and maintain the prescription regimen.  Written material given at graduation.   Intimacy: - Group verbal instruction through game format to discuss how heart and lung disease can affect sexual intimacy. Written material given at graduation..   Know Your Numbers and Heart Failure: - Group verbal and visual instruction to discuss disease risk factors for cardiac and pulmonary disease and treatment options.  Reviews associated critical values for Overweight/Obesity, Hypertension, Cholesterol, and Diabetes.  Discusses basics of heart failure: signs/symptoms and treatments.  Introduces Heart Failure Zone chart for action plan for heart failure.  Written material given at graduation.   Infection Prevention: - Provides verbal and written material to individual with discussion of infection control including proper hand washing and proper equipment cleaning during exercise session. Flowsheet Row Cardiac Rehab from 01/21/2022 in [facility] Cardiac and Pulmonary Rehab  Date 01/21/22  Educator [facility]  Instruction Review Code 1- Verbalizes Understanding       Falls Prevention: - Provides verbal and written material to individual with discussion of falls prevention and safety. Flowsheet Row Cardiac Rehab from 01/21/2022 in [facility] Cardiac and Pulmonary Rehab  Date 01/04/22  Educator SB  Instruction Review Code 1- Verbalizes Understanding       Other: -Provides group and verbal  instruction on various topics (see comments)   Knowledge Questionnaire Score:  Knowledge Questionnaire Score - 01/21/22 1402       Knowledge Questionnaire Score   Pre Score 24/26             Core Components/Risk Factors/Patient Goals at Admission:  Personal Goals and Risk Factors at Admission - 01/21/22 1402       Core Components/Risk Factors/Patient Goals on Admission    Weight Management Yes;Weight Loss    Intervention Weight Management: Develop a combined nutrition and exercise program designed to reach desired caloric intake, while maintaining appropriate intake of nutrient and fiber, sodium and fats, and appropriate energy expenditure required for the weight goal.;Weight Management: Provide education and appropriate resources to help participant work on and attain dietary goals.;Weight Management/Obesity: Establish reasonable short term and long term weight goals.    Admit Weight 165 lb 12.8 oz (75.2 kg)    Goal Weight: Short Term 163 lb (73.9 kg)    Goal Weight: Long Term 160 lb (72.6 kg)    Expected Outcomes Short Term: Continue to assess and modify interventions until short term weight is achieved;Long Term: Adherence to nutrition and physical activity/exercise program aimed toward attainment of established weight goal;Weight Loss: Understanding of general recommendations for a balanced deficit meal plan, which promotes 1-2 lb weight loss per week and includes a negative energy balance of [phone] kcal/d;Understanding recommendations for meals to include 15-35% energy as protein, 25-35% energy from fat, 35-60% energy from carbohydrates, less than '200mg'$  of dietary cholesterol, 20-35 gm of total fiber daily;Understanding of distribution of calorie intake throughout the day with the consumption of 4-5 meals/snacks    Hypertension Yes    Intervention Provide education on lifestyle modifcations  including regular physical activity/exercise, weight management, moderate sodium restriction  and increased consumption of fresh fruit, vegetables, and low fat dairy, alcohol moderation, and smoking cessation.;Monitor prescription use compliance.    Expected Outcomes Short Term: Continued assessment and intervention until BP is < 140/63m HG in hypertensive participants. < 130/860mHG in hypertensive participants with diabetes, heart failure or chronic kidney disease.;Long Term: Maintenance of blood pressure at goal levels.    Lipids Yes    Intervention Provide education and support for participant on nutrition & aerobic/resistive exercise along with prescribed medications to achieve LDL '70mg'$ , HDL >'40mg'$ .    Expected Outcomes Short Term: Participant states understanding of desired cholesterol values and is compliant with medications prescribed. Participant is following exercise prescription and nutrition guidelines.;Long Term: Cholesterol controlled with medications as prescribed, with individualized exercise RX and with personalized nutrition plan. Value goals: LDL < '70mg'$ , HDL > 40 mg.             Education:Diabetes - Individual verbal and written instruction to review signs/symptoms of diabetes, desired ranges of glucose level fasting, after meals and with exercise. Acknowledge that pre and post exercise glucose checks will be done for 3 sessions at entry of program.   Core Components/Risk Factors/Patient Goals Review:    Core Components/Risk Factors/Patient Goals at Discharge (Final Review):    ITP Comments:  ITP Comments     Row Name 01/04/22 1034 01/21/22 1348         ITP Comments Virtual orientation call completed today. he has an appointment on Date: 01/21/2022  for EP eval and gym Orientation.  [facility] Completed 6MWT and gym orientation. Initial ITP created and sent for review to Dr. MaEmily FilbertMedical Director.               Comments: Initial ITP

## 2022-01-21 NOTE — Patient Instructions (Addendum)
Patient Instructions  Patient Details  Name: Brett Wilson MRN: [id number] Date of Birth: 1955/09/12 Referring Provider:  Andrez Grime, MD  Below are your personal goals for exercise, nutrition, and risk factors. Our goal is to help you stay on track towards obtaining and maintaining these goals. We will be discussing your progress on these goals with you throughout the program.  Initial Exercise Prescription:  Initial Exercise Prescription - 01/21/22 1300       Date of Initial Exercise RX and Referring Provider   Date 01/21/22    Referring Provider Donnelly Angelica MD   New Cardiolgoist: Dr. Isaias Cowman     Oxygen   Maintain Oxygen Saturation 88% or higher      Treadmill   MPH 2.6    Grade 2    Minutes 15    METs 3.71      Elliptical   Level 1    Speed 3.6    Minutes 15    METs 3.7      REL-XR   Level 3    Speed 50    Minutes 15    METs 3.7      Prescription Details   Frequency (times per week) 3    Duration Progress to 30 minutes of continuous aerobic without signs/symptoms of physical distress      Intensity   THRR 40-80% of Max Heartrate 110-139    Ratings of Perceived Exertion 11-13    Perceived Dyspnea 0-4      Progression   Progression Continue to progress workloads to maintain intensity without signs/symptoms of physical distress.      Resistance Training   Training Prescription Yes    Weight 5 lb    Reps 10-15             Exercise Goals: Frequency: Be able to perform aerobic exercise two to three times per week in program working toward 2-5 days per week of home exercise.  Intensity: Work with a perceived exertion of 11 (fairly light) - 15 (hard) while following your exercise prescription.  We will make changes to your prescription with you as you progress through the program.   Duration: Be able to do 30 to 45 minutes of continuous aerobic exercise in addition to a 5 minute warm-up and a 5 minute cool-down routine.   Nutrition  Goals: Your personal nutrition goals will be established when you do your nutrition analysis with the dietician.  The following are general nutrition guidelines to follow: Cholesterol < '200mg'$ /day Sodium < '1500mg'$ /day Fiber: Men over 50 yrs - 30 grams per day  Personal Goals:  Personal Goals and Risk Factors at Admission - 01/21/22 1402       Core Components/Risk Factors/Patient Goals on Admission    Weight Management Yes;Weight Loss    Intervention Weight Management: Develop a combined nutrition and exercise program designed to reach desired caloric intake, while maintaining appropriate intake of nutrient and fiber, sodium and fats, and appropriate energy expenditure required for the weight goal.;Weight Management: Provide education and appropriate resources to help participant work on and attain dietary goals.;Weight Management/Obesity: Establish reasonable short term and long term weight goals.    Admit Weight 165 lb 12.8 oz (75.2 kg)    Goal Weight: Short Term 163 lb (73.9 kg)    Goal Weight: Long Term 160 lb (72.6 kg)    Expected Outcomes Short Term: Continue to assess and modify interventions until short term weight is achieved;Long Term: Adherence to nutrition and physical activity/exercise  program aimed toward attainment of established weight goal;Weight Loss: Understanding of general recommendations for a balanced deficit meal plan, which promotes 1-2 lb weight loss per week and includes a negative energy balance of [phone] kcal/d;Understanding recommendations for meals to include 15-35% energy as protein, 25-35% energy from fat, 35-60% energy from carbohydrates, less than '200mg'$  of dietary cholesterol, 20-35 gm of total fiber daily;Understanding of distribution of calorie intake throughout the day with the consumption of 4-5 meals/snacks    Hypertension Yes    Intervention Provide education on lifestyle modifcations including regular physical activity/exercise, weight management, moderate  sodium restriction and increased consumption of fresh fruit, vegetables, and low fat dairy, alcohol moderation, and smoking cessation.;Monitor prescription use compliance.    Expected Outcomes Short Term: Continued assessment and intervention until BP is < 140/79m HG in hypertensive participants. < 130/818mHG in hypertensive participants with diabetes, heart failure or chronic kidney disease.;Long Term: Maintenance of blood pressure at goal levels.    Lipids Yes    Intervention Provide education and support for participant on nutrition & aerobic/resistive exercise along with prescribed medications to achieve LDL '70mg'$ , HDL >'40mg'$ .    Expected Outcomes Short Term: Participant states understanding of desired cholesterol values and is compliant with medications prescribed. Participant is following exercise prescription and nutrition guidelines.;Long Term: Cholesterol controlled with medications as prescribed, with individualized exercise RX and with personalized nutrition plan. Value goals: LDL < '70mg'$ , HDL > 40 mg.             Tobacco Use Initial Evaluation: Social History   Tobacco Use  Smoking Status Never  Smokeless Tobacco Never    Exercise Goals and Review:  Exercise Goals     Row Name 01/21/22 1400             Exercise Goals   Increase Physical Activity Yes       Intervention Provide advice, education, support and counseling about physical activity/exercise needs.;Develop an individualized exercise prescription for aerobic and resistive training based on initial evaluation findings, risk stratification, comorbidities and participant's personal goals.       Expected Outcomes Short Term: Attend rehab on a regular basis to increase amount of physical activity.;Long Term: Add in home exercise to make exercise part of routine and to increase amount of physical activity.;Long Term: Exercising regularly at least 3-5 days a week.       Increase Strength and Stamina Yes        Intervention Provide advice, education, support and counseling about physical activity/exercise needs.;Develop an individualized exercise prescription for aerobic and resistive training based on initial evaluation findings, risk stratification, comorbidities and participant's personal goals.       Expected Outcomes Short Term: Increase workloads from initial exercise prescription for resistance, speed, and METs.;Short Term: Perform resistance training exercises routinely during rehab and add in resistance training at home;Long Term: Improve cardiorespiratory fitness, muscular endurance and strength as measured by increased METs and functional capacity (6MWT)       Able to understand and use rate of perceived exertion (RPE) scale Yes       Intervention Provide education and explanation on how to use RPE scale       Expected Outcomes Short Term: Able to use RPE daily in rehab to express subjective intensity level;Long Term:  Able to use RPE to guide intensity level when exercising independently       Able to understand and use Dyspnea scale Yes       Intervention Provide education and explanation  on how to use Dyspnea scale       Expected Outcomes Short Term: Able to use Dyspnea scale daily in rehab to express subjective sense of shortness of breath during exertion;Long Term: Able to use Dyspnea scale to guide intensity level when exercising independently       Knowledge and understanding of Target Heart Rate Range (THRR) Yes       Intervention Provide education and explanation of THRR including how the numbers were predicted and where they are located for reference       Expected Outcomes Short Term: Able to state/look up THRR;Short Term: Able to use daily as guideline for intensity in rehab;Long Term: Able to use THRR to govern intensity when exercising independently       Able to check pulse independently Yes       Intervention Provide education and demonstration on how to check pulse in carotid and  radial arteries.;Review the importance of being able to check your own pulse for safety during independent exercise       Expected Outcomes Short Term: Able to explain why pulse checking is important during independent exercise;Long Term: Able to check pulse independently and accurately       Understanding of Exercise Prescription Yes       Intervention Provide education, explanation, and written materials on patient's individual exercise prescription       Expected Outcomes Long Term: Able to explain home exercise prescription to exercise independently;Short Term: Able to explain program exercise prescription                Copy of goals given to participant.

## 2022-01-23 ENCOUNTER — Ambulatory Visit: Payer: Medicare HMO | Admitting: Dermatology

## 2022-01-23 ENCOUNTER — Encounter: Payer: Self-pay | Admitting: Dermatology

## 2022-01-23 ENCOUNTER — Encounter: Payer: Self-pay | Admitting: *Deleted

## 2022-01-23 DIAGNOSIS — D239 Other benign neoplasm of skin, unspecified: Secondary | ICD-10-CM

## 2022-01-23 DIAGNOSIS — L821 Other seborrheic keratosis: Secondary | ICD-10-CM

## 2022-01-23 DIAGNOSIS — L578 Other skin changes due to chronic exposure to nonionizing radiation: Secondary | ICD-10-CM | POA: Diagnosis not present

## 2022-01-23 DIAGNOSIS — Z1283 Encounter for screening for malignant neoplasm of skin: Secondary | ICD-10-CM | POA: Diagnosis not present

## 2022-01-23 DIAGNOSIS — D229 Melanocytic nevi, unspecified: Secondary | ICD-10-CM

## 2022-01-23 DIAGNOSIS — D1800 Hemangioma unspecified site: Secondary | ICD-10-CM

## 2022-01-23 DIAGNOSIS — D2339 Other benign neoplasm of skin of other parts of face: Secondary | ICD-10-CM | POA: Diagnosis not present

## 2022-01-23 DIAGNOSIS — L814 Other melanin hyperpigmentation: Secondary | ICD-10-CM

## 2022-01-23 DIAGNOSIS — Z955 Presence of coronary angioplasty implant and graft: Secondary | ICD-10-CM

## 2022-01-23 DIAGNOSIS — L820 Inflamed seborrheic keratosis: Secondary | ICD-10-CM

## 2022-01-23 DIAGNOSIS — Z86018 Personal history of other benign neoplasm: Secondary | ICD-10-CM

## 2022-01-23 NOTE — Patient Instructions (Addendum)
Cryotherapy Aftercare  Wash gently with soap and water everyday.   Apply Vaseline and Band-Aid daily until healed.     Due to recent changes in healthcare laws, you may see results of your pathology and/or laboratory studies on MyChart before the doctors have had a chance to review them. We understand that in some cases there may be results that are confusing or concerning to you. Please understand that not all results are received at the same time and often the doctors may need to interpret multiple results in order to provide you with the best plan of care or course of treatment. Therefore, we ask that you please give us 2 business days to thoroughly review all your results before contacting the office for clarification. Should we see a critical lab result, you will be contacted sooner.   If You Need Anything After Your Visit  If you have any questions or concerns for your doctor, please call our main line at [phone] and press option 4 to reach your doctor's medical assistant. If no one answers, please leave a voicemail as directed and we will return your call as soon as possible. Messages left after 4 pm will be answered the following business day.   You may also send us a message via MyChart. We typically respond to MyChart messages within 1-2 business days.  For prescription refills, please ask your pharmacy to contact our office. Our fax number is [phone].  If you have an urgent issue when the clinic is closed that cannot wait until the next business day, you can page your doctor at the number below.    Please note that while we do our best to be available for urgent issues outside of office hours, we are not available 24/7.   If you have an urgent issue and are unable to reach us, you may choose to seek medical care at your doctor's office, retail clinic, urgent care center, or emergency room.  If you have a medical emergency, please immediately call 911 or go to the  emergency department.  Pager Numbers  - Dr. Kowalski: [phone]  - Dr. Moye: [phone]  - Dr. Stewart: [phone]  In the event of inclement weather, please call our main line at [phone] for an update on the status of any delays or closures.  Dermatology Medication Tips: Please keep the boxes that topical medications come in in order to help keep track of the instructions about where and how to use these. Pharmacies typically print the medication instructions only on the boxes and not directly on the medication tubes.   If your medication is too expensive, please contact our office at [phone] option 4 or send us a message through MyChart.   We are unable to tell what your co-pay for medications will be in advance as this is different depending on your insurance coverage. However, we may be able to find a substitute medication at lower cost or fill out paperwork to get insurance to cover a needed medication.   If a prior authorization is required to get your medication covered by your insurance company, please allow us 1-2 business days to complete this process.  Drug prices often vary depending on where the prescription is filled and some pharmacies may offer cheaper prices.  The website www.goodrx.com contains coupons for medications through different pharmacies. The prices here do not account for what the cost may be with help from insurance (it may be cheaper with your insurance), but the website can   give you the price if you did not use any insurance.  - You can print the associated coupon and take it with your prescription to the pharmacy.  - You may also stop by our office during regular business hours and pick up a GoodRx coupon card.  - If you need your prescription sent electronically to a different pharmacy, notify our office through [facility] MyChart or by phone at [phone] option 4.     Si Usted Necesita Algo Despus de Su Visita  Tambin puede  enviarnos un mensaje a travs de MyChart. Por lo general respondemos a los mensajes de MyChart en el transcurso de 1 a 2 das hbiles.  Para renovar recetas, por favor pida a su farmacia que se ponga en contacto con nuestra oficina. Nuestro nmero de fax es el [phone].  Si tiene un asunto urgente cuando la clnica est cerrada y que no puede esperar hasta el siguiente da hbil, puede llamar/localizar a su doctor(a) al nmero que aparece a continuacin.   Por favor, tenga en cuenta que aunque hacemos todo lo posible para estar disponibles para asuntos urgentes fuera del horario de oficina, no estamos disponibles las 24 horas del da, los 7 das de la semana.   Si tiene un problema urgente y no puede comunicarse con nosotros, puede optar por buscar atencin mdica  en el consultorio de su doctor(a), en una clnica privada, en un centro de atencin urgente o en una sala de emergencias.  Si tiene una emergencia mdica, por favor llame inmediatamente al 911 o vaya a la sala de emergencias.  Nmeros de bper  - Dr. Kowalski: [phone]  - Dra. Moye: [phone]  - Dra. Stewart: [phone]  En caso de inclemencias del tiempo, por favor llame a nuestra lnea principal al [phone] para una actualizacin sobre el estado de cualquier retraso o cierre.  Consejos para la medicacin en dermatologa: Por favor, guarde las cajas en las que vienen los medicamentos de uso tpico para ayudarle a seguir las instrucciones sobre dnde y cmo usarlos. Las farmacias generalmente imprimen las instrucciones del medicamento slo en las cajas y no directamente en los tubos del medicamento.   Si su medicamento es muy caro, por favor, pngase en contacto con nuestra oficina llamando al [phone] y presione la opcin 4 o envenos un mensaje a travs de MyChart.   No podemos decirle cul ser su copago por los medicamentos por adelantado ya que esto es diferente dependiendo de la cobertura de su seguro.  Sin embargo, es posible que podamos encontrar un medicamento sustituto a menor costo o llenar un formulario para que el seguro cubra el medicamento que se considera necesario.   Si se requiere una autorizacin previa para que su compaa de seguros cubra su medicamento, por favor permtanos de 1 a 2 das hbiles para completar este proceso.  Los precios de los medicamentos varan con frecuencia dependiendo del lugar de dnde se surte la receta y alguna farmacias pueden ofrecer precios ms baratos.  El sitio web www.goodrx.com tiene cupones para medicamentos de diferentes farmacias. Los precios aqu no tienen en cuenta lo que podra costar con la ayuda del seguro (puede ser ms barato con su seguro), pero el sitio web puede darle el precio si no utiliz ningn seguro.  - Puede imprimir el cupn correspondiente y llevarlo con su receta a la farmacia.  - Tambin puede pasar por nuestra oficina durante el horario de atencin regular y recoger una tarjeta de cupones de GoodRx.  -   Si necesita que su receta se enve electrnicamente a una farmacia diferente, informe a nuestra oficina a travs de MyChart de [place] o por telfono llamando al [phone] y presione la opcin 4.  

## 2022-01-23 NOTE — Progress Notes (Signed)
Follow-Up Visit   Subjective  Brett Wilson is a 66 y.o. male who presents for the following: Annual Exam (Mole check ). Hx of Dysplastic nevus.  The patient presents for Total-Body Skin Exam (TBSE) for skin cancer screening and mole check.  The patient has spots, moles and lesions to be evaluated, some may be new or changing and the patient has concerns that these could be cancer.   The following portions of the chart were reviewed this encounter and updated as appropriate:   Tobacco  Allergies  Meds  Problems  Med Hx  Surg Hx  Fam Hx     Review of Systems:  No other skin or systemic complaints except as noted in HPI or Assessment and Plan.  Objective  Well appearing patient in no apparent distress; mood and affect are within normal limits.  A full examination was performed including scalp, head, eyes, ears, nose, lips, neck, chest, axillae, abdomen, back, buttocks, bilateral upper extremities, bilateral lower extremities, hands, feet, fingers, toes, fingernails, and toenails. All findings within normal limits unless otherwise noted below.  mid back spinal x 1, right side burn x 1  (2) (2) Stuck-on, waxy, tan-brown papule  --Discussed benign etiology and prognosis.   forehead Dilated Pore    Assessment & Plan  Inflamed seborrheic keratosis (2) mid back spinal x 1, right side burn x 1  (2) Symptomatic, irritating, patient would like treated.  Destruction of lesion - mid back spinal x 1, right side burn x 1  (2) Complexity: simple   Destruction method: cryotherapy   Informed consent: discussed and consent obtained   Timeout:  patient name, date of birth, surgical site, and procedure verified Lesion destroyed using liquid nitrogen: Yes   Region frozen until ice ball extended beyond lesion: Yes   Outcome: patient tolerated procedure well with no complications   Post-procedure details: wound care instructions given    Dilated pore of [place] forehead Benign-appearing.   Observation.  Call clinic for new or changing moles.  Recommend daily use of broad spectrum spf 30+ sunscreen to sun-exposed areas.    Lentigines - Scattered tan macules - Due to sun exposure - Benign-appearing, observe - Recommend daily broad spectrum sunscreen SPF 30+ to sun-exposed areas, reapply every 2 hours as needed. - Call for any changes  Seborrheic Keratoses - Stuck-on, waxy, tan-brown papules and/or plaques  - Benign-appearing - Discussed benign etiology and prognosis. - Observe - Call for any changes  Melanocytic Nevi - Tan-brown and/or pink-flesh-colored symmetric macules and papules - Benign appearing on exam today - Observation - Call clinic for new or changing moles - Recommend daily use of broad spectrum spf 30+ sunscreen to sun-exposed areas.   Hemangiomas - Red papules - Discussed benign nature - Observe - Call for any changes  Actinic Damage - Chronic condition, secondary to cumulative UV/sun exposure - diffuse scaly erythematous macules with underlying dyspigmentation - Recommend daily broad spectrum sunscreen SPF 30+ to sun-exposed areas, reapply every 2 hours as needed.  - Staying in the shade or wearing long sleeves, sun glasses (UVA+UVB protection) and wide brim hats (4-inch brim around the entire circumference of the hat) are also recommended for sun protection.  - Call for new or changing lesions.  History of Dysplastic Nevi Multiple see history  - No evidence of recurrence today - Recommend regular full body skin exams - Recommend daily broad spectrum sunscreen SPF 30+ to sun-exposed areas, reapply every 2 hours as needed.  - Call if any  new or changing lesions are noted between office visits   Skin cancer screening performed today.   Return in about 1 year (around 01/24/2023) for TBSE, hx of Dyplastic nevus .  IMarye Round, CMA, am acting as scribe for Sarina Ser, MD .  Documentation: I have reviewed the above documentation for  accuracy and completeness, and I agree with the above.  Sarina Ser, MD

## 2022-01-23 NOTE — Progress Notes (Signed)
Cardiac Individual Treatment Plan  Patient Details  Name: Brett Wilson MRN: [id number] Date of Birth: 1956-05-30 Referring Provider:   Flowsheet Row Cardiac Rehab from 01/21/2022 in [facility] Cardiac and Pulmonary Rehab  Referring Provider Donnelly Angelica MD  [facility] Cardiolgoist: Dr. Sheppard Coil Paraschos]       Initial Encounter Date:  Flowsheet Row Cardiac Rehab from 01/21/2022 in [facility] Cardiac and Pulmonary Rehab  Date 01/21/22       Visit Diagnosis: Status post coronary artery stent placement  Patient's Home Medications on Admission:  Current Outpatient Medications:    aspirin EC 81 MG tablet, Take 81 mg by mouth daily. Swallow whole., Disp: , Rfl:    clopidogrel (PLAVIX) 75 MG tablet, Take 1 tablet (75 mg total) by mouth daily., Disp: 90 tablet, Rfl: 3   cyclobenzaprine (FLEXERIL) 5 MG tablet, Take 1 tablet (5 mg total) by mouth at bedtime. (Patient not taking: Reported on 12/26/2021), Disp: 30 tablet, Rfl: 2   finasteride (PROSCAR) 5 MG tablet, Take 1 tablet (5 mg total) by mouth at bedtime., Disp: 90 tablet, Rfl: 3   hydrocortisone 2.5 % lotion, Apply topically 3 (three) times a week. Apply to aa face 3 nights weekly, Tuesday, Thursday and Saturday, Disp: 59 mL, Rfl: 6   losartan (COZAAR) 100 MG tablet, TAKE 1 TABLET BY MOUTH EVERY DAY, Disp: 90 tablet, Rfl: 1   metoprolol succinate (TOPROL-XL) 25 MG 24 hr tablet, Take 25 mg by mouth daily., Disp: , Rfl:    Multiple Vitamin (MULTIVITAMIN WITH MINERALS) TABS tablet, Take 1 tablet by mouth daily., Disp: , Rfl:    rosuvastatin (CRESTOR) 40 MG tablet, Take 1 tablet (40 mg total) by mouth daily., Disp: 90 tablet, Rfl: 3   Zinc 50 MG TABS, Take 50 mg by mouth daily., Disp: , Rfl:   Past Medical History: Past Medical History:  Diagnosis Date   Allergy    Dysplastic nevus 01/17/2020   Left mid side. Severe atypia with scar, close to margin. exc 03/14/2020   Dysplastic nevus 01/17/2020   Left calf. Moderate atypia, deep margin involved.     Dysplastic nevus 01/17/2020   LUQ abdomen. Mild atypia, limited margins free.    Hyperlipidemia    Hypertension    Pituitary adenoma ([place])     Tobacco Use: Social History   Tobacco Use  Smoking Status Never  Smokeless Tobacco Never    Labs: Review Flowsheet       Latest Ref Rng & Units 07/22/2018 09/01/2019 09/01/2020 09/05/2021  Labs for ITP Cardiac and Pulmonary Rehab  Cholestrol 100 - 199 mg/dL 183     179  184  168   LDL (calc) 0 - 99 mg/dL 108     102  119  102   HDL-C >39 mg/dL 43     43  38  45   Trlycerides 0 - 149 mg/dL - 197  148  119   Hemoglobin A1c 4.8 - 5.6 % - - - 5.9     Details       This result is from an external source.          Exercise Target Goals: Exercise Program Goal: Individual exercise prescription set using results from initial 6 min walk test and THRR while considering  patient's activity barriers and safety.   Exercise Prescription Goal: Initial exercise prescription builds to 30-45 minutes a day of aerobic activity, 2-3 days per week.  Home exercise guidelines will be given to patient during program as part of exercise  prescription that the participant will acknowledge.   Education: Aerobic Exercise: - Group verbal and visual presentation on the components of exercise prescription. Introduces F.I.T.T principle from ACSM for exercise prescriptions.  Reviews F.I.T.T. principles of aerobic exercise including progression. Written material given at graduation. Flowsheet Row Cardiac Rehab from 01/21/2022 in [facility] Cardiac and Pulmonary Rehab  Education need identified 01/21/22       Education: Resistance Exercise: - Group verbal and visual presentation on the components of exercise prescription. Introduces F.I.T.T principle from ACSM for exercise prescriptions  Reviews F.I.T.T. principles of resistance exercise including progression. Written material given at graduation.    Education: Exercise & Equipment Safety: - Individual verbal  instruction and demonstration of equipment use and safety with use of the equipment. Flowsheet Row Cardiac Rehab from 01/21/2022 in [facility] Cardiac and Pulmonary Rehab  Date 01/21/22  Educator [facility]  Instruction Review Code 1- Verbalizes Understanding       Education: Exercise Physiology & General Exercise Guidelines: - Group verbal and written instruction with models to review the exercise physiology of the cardiovascular system and associated critical values. Provides general exercise guidelines with specific guidelines to those with heart or lung disease.    Education: Flexibility, Balance, Mind/Body Relaxation: - Group verbal and visual presentation with interactive activity on the components of exercise prescription. Introduces F.I.T.T principle from ACSM for exercise prescriptions. Reviews F.I.T.T. principles of flexibility and balance exercise training including progression. Also discusses the mind body connection.  Reviews various relaxation techniques to help reduce and manage stress (i.e. Deep breathing, progressive muscle relaxation, and visualization). Balance handout provided to take home. Written material given at graduation.   Activity Barriers & Risk Stratification:  Activity Barriers & Cardiac Risk Stratification - 01/21/22 1351       Activity Barriers & Cardiac Risk Stratification   Activity Barriers Other (comment);Balance Concerns;Muscular Weakness;Neck/Spine Problems    Comments partially torn L Medial menincus, buldging disc    Cardiac Risk Stratification Moderate             6 Minute Walk:  6 Minute Walk     Row Name 01/21/22 1350         6 Minute Walk   Phase Initial     Distance 1400 feet     Walk Time 6 minutes     # of Rest Breaks 0     MPH 2.65     METS 3.78     RPE 9     VO2 Peak 13.24     Symptoms No     Resting HR 81 bpm     Resting BP 152/76     Resting Oxygen Saturation  99 %     Exercise Oxygen Saturation  during 6 min walk 98 %     Max  Ex. HR 101 bpm     Max Ex. BP 168/82     2 Minute Post BP 154/78              Oxygen Initial Assessment:   Oxygen Re-Evaluation:   Oxygen Discharge (Final Oxygen Re-Evaluation):   Initial Exercise Prescription:  Initial Exercise Prescription - 01/21/22 1300       Date of Initial Exercise RX and Referring Provider   Date 01/21/22    Referring Provider Donnelly Angelica MD   New Cardiolgoist: Dr. Isaias Cowman     Oxygen   Maintain Oxygen Saturation 88% or higher      Treadmill   MPH 2.6    Grade 2  Minutes 15    METs 3.71      Elliptical   Level 1    Speed 3.6    Minutes 15    METs 3.7      REL-XR   Level 3    Speed 50    Minutes 15    METs 3.7      Prescription Details   Frequency (times per week) 3    Duration Progress to 30 minutes of continuous aerobic without signs/symptoms of physical distress      Intensity   THRR 40-80% of Max Heartrate 110-139    Ratings of Perceived Exertion 11-13    Perceived Dyspnea 0-4      Progression   Progression Continue to progress workloads to maintain intensity without signs/symptoms of physical distress.      Resistance Training   Training Prescription Yes    Weight 5 lb    Reps 10-15             Perform Capillary Blood Glucose checks as needed.  Exercise Prescription Changes:   Exercise Prescription Changes     Row Name 01/21/22 1300             Response to Exercise   Blood Pressure (Admit) 152/76       Blood Pressure (Exercise) 168/82       Blood Pressure (Exit) 154/78       Heart Rate (Admit) 81 bpm       Heart Rate (Exercise) 101 bpm       Heart Rate (Exit) 84 bpm       Oxygen Saturation (Admit) 99 %       Oxygen Saturation (Exercise) 98 %       Rating of Perceived Exertion (Exercise) 9       Symptoms none       Comments walk test results                Exercise Comments:   Exercise Goals and Review:   Exercise Goals     Row Name 01/21/22 1400              Exercise Goals   Increase Physical Activity Yes       Intervention Provide advice, education, support and counseling about physical activity/exercise needs.;Develop an individualized exercise prescription for aerobic and resistive training based on initial evaluation findings, risk stratification, comorbidities and participant's personal goals.       Expected Outcomes Short Term: Attend rehab on a regular basis to increase amount of physical activity.;Long Term: Add in home exercise to make exercise part of routine and to increase amount of physical activity.;Long Term: Exercising regularly at least 3-5 days a week.       Increase Strength and Stamina Yes       Intervention Provide advice, education, support and counseling about physical activity/exercise needs.;Develop an individualized exercise prescription for aerobic and resistive training based on initial evaluation findings, risk stratification, comorbidities and participant's personal goals.       Expected Outcomes Short Term: Increase workloads from initial exercise prescription for resistance, speed, and METs.;Short Term: Perform resistance training exercises routinely during rehab and add in resistance training at home;Long Term: Improve cardiorespiratory fitness, muscular endurance and strength as measured by increased METs and functional capacity (6MWT)       Able to understand and use rate of perceived exertion (RPE) scale Yes       Intervention Provide education and explanation on how to use RPE  scale       Expected Outcomes Short Term: Able to use RPE daily in rehab to express subjective intensity level;Long Term:  Able to use RPE to guide intensity level when exercising independently       Able to understand and use Dyspnea scale Yes       Intervention Provide education and explanation on how to use Dyspnea scale       Expected Outcomes Short Term: Able to use Dyspnea scale daily in rehab to express subjective sense of shortness of breath  during exertion;Long Term: Able to use Dyspnea scale to guide intensity level when exercising independently       Knowledge and understanding of Target Heart Rate Range (THRR) Yes       Intervention Provide education and explanation of THRR including how the numbers were predicted and where they are located for reference       Expected Outcomes Short Term: Able to state/look up THRR;Short Term: Able to use daily as guideline for intensity in rehab;Long Term: Able to use THRR to govern intensity when exercising independently       Able to check pulse independently Yes       Intervention Provide education and demonstration on how to check pulse in carotid and radial arteries.;Review the importance of being able to check your own pulse for safety during independent exercise       Expected Outcomes Short Term: Able to explain why pulse checking is important during independent exercise;Long Term: Able to check pulse independently and accurately       Understanding of Exercise Prescription Yes       Intervention Provide education, explanation, and written materials on patient's individual exercise prescription       Expected Outcomes Long Term: Able to explain home exercise prescription to exercise independently;Short Term: Able to explain program exercise prescription                Exercise Goals Re-Evaluation :   Discharge Exercise Prescription (Final Exercise Prescription Changes):  Exercise Prescription Changes - 01/21/22 1300       Response to Exercise   Blood Pressure (Admit) 152/76    Blood Pressure (Exercise) 168/82    Blood Pressure (Exit) 154/78    Heart Rate (Admit) 81 bpm    Heart Rate (Exercise) 101 bpm    Heart Rate (Exit) 84 bpm    Oxygen Saturation (Admit) 99 %    Oxygen Saturation (Exercise) 98 %    Rating of Perceived Exertion (Exercise) 9    Symptoms none    Comments walk test results             Nutrition:  Target Goals: Understanding of nutrition  guidelines, daily intake of sodium '1500mg'$ , cholesterol '200mg'$ , calories 30% from fat and 7% or less from saturated fats, daily to have 5 or more servings of fruits and vegetables.  Education: All About Nutrition: -Group instruction provided by verbal, written material, interactive activities, discussions, models, and posters to present general guidelines for heart healthy nutrition including fat, fiber, MyPlate, the role of sodium in heart healthy nutrition, utilization of the nutrition label, and utilization of this knowledge for meal planning. Follow up email sent as well. Written material given at graduation.   Biometrics:  Pre Biometrics - 01/21/22 1400       Pre Biometrics   Height 5' 8.8" (1.748 m)    Weight 169 lb 12.8 oz (77 kg)    BMI (Calculated) 25.21  Single Leg Stand 15 seconds              Nutrition Therapy Plan and Nutrition Goals:  Nutrition Therapy & Goals - 01/21/22 1401       Intervention Plan   Intervention Prescribe, educate and counsel regarding individualized specific dietary modifications aiming towards targeted core components such as weight, hypertension, lipid management, diabetes, heart failure and other comorbidities.    Expected Outcomes Short Term Goal: Understand basic principles of dietary content, such as calories, fat, sodium, cholesterol and nutrients.;Short Term Goal: A plan has been developed with personal nutrition goals set during dietitian appointment.;Long Term Goal: Adherence to prescribed nutrition plan.             Nutrition Assessments:  MEDIFICTS Score Key: ?70 Need to make dietary changes  40-70 Heart Healthy Diet ? 40 Therapeutic Level Cholesterol Diet  Flowsheet Row Cardiac Rehab from 01/21/2022 in [facility] Cardiac and Pulmonary Rehab  Picture Your Plate Total Score on Admission 73      Picture Your Plate Scores: <93 Unhealthy dietary pattern with much room for improvement. 41-50 Dietary pattern unlikely to meet  recommendations for good health and room for improvement. 51-60 More healthful dietary pattern, with some room for improvement.  >60 Healthy dietary pattern, although there may be some specific behaviors that could be improved.    Nutrition Goals Re-Evaluation:   Nutrition Goals Discharge (Final Nutrition Goals Re-Evaluation):   Psychosocial: Target Goals: Acknowledge presence or absence of significant depression and/or stress, maximize coping skills, provide positive support system. Participant is able to verbalize types and ability to use techniques and skills needed for reducing stress and depression.   Education: Stress, Anxiety, and Depression - Group verbal and visual presentation to define topics covered.  Reviews how body is impacted by stress, anxiety, and depression.  Also discusses healthy ways to reduce stress and to treat/manage anxiety and depression.  Written material given at graduation.   Education: Sleep Hygiene -Provides group verbal and written instruction about how sleep can affect your health.  Define sleep hygiene, discuss sleep cycles and impact of sleep habits. Review good sleep hygiene tips.    Initial Review & Psychosocial Screening:  Initial Psych Review & Screening - 01/04/22 1011       Initial Review   Current issues with None Identified      Family Dynamics   Good Support System? Yes   wife     Barriers   Psychosocial barriers to participate in program There are no identifiable barriers or psychosocial needs.;The patient should benefit from training in stress management and relaxation.      Screening Interventions   Interventions Encouraged to exercise;To provide support and resources with identified psychosocial needs;Provide feedback about the scores to participant    Expected Outcomes Short Term goal: Utilizing psychosocial counselor, staff and physician to assist with identification of specific Stressors or current issues interfering with  healing process. Setting desired goal for each stressor or current issue identified.;Long Term Goal: Stressors or current issues are controlled or eliminated.;Short Term goal: Identification and review with participant of any Quality of Life or Depression concerns found by scoring the questionnaire.;Long Term goal: The participant improves quality of Life and PHQ9 Scores as seen by post scores and/or verbalization of changes             Quality of Life Scores:   Quality of Life - 01/21/22 1401       Quality of Life   Select Quality of  Life      Quality of Life Scores   Health/Function Pre 28.4 %    Socioeconomic Pre 26.25 %    Psych/Spiritual Pre 29.14 %    Family Pre 30 %    GLOBAL Pre 28.29 %            Scores of 19 and below usually indicate a poorer quality of life in these areas.  A difference of  2-3 points is a clinically meaningful difference.  A difference of 2-3 points in the total score of the Quality of Life Index has been associated with significant improvement in overall quality of life, self-image, physical symptoms, and general health in studies assessing change in quality of life.  PHQ-9: Review Flowsheet  More data exists      01/21/2022 09/05/2021 03/22/2021 09/01/2020 07/21/2020  Depression screen PHQ 2/9  Decreased Interest 0 0 0 0 0  Down, Depressed, Hopeless 0 0 0 0 0  PHQ - 2 Score 0 0 0 0 0  Altered sleeping 0 0 '1 1 2  '$ Tired, decreased energy 0 0 0 1 1  Change in appetite 0 0 0 0 0  Feeling bad or failure about yourself  0 0 0 0 0  Trouble concentrating 0 0 0 0 0  Moving slowly or fidgety/restless 0 0 0 0 0  Suicidal thoughts 0 0 0 0 0  PHQ-9 Score 0 0 '1 2 3  '$ Difficult doing work/chores Not difficult at all Not difficult at all Not difficult at all Not difficult at all Not difficult at all   Interpretation of Total Score  Total Score Depression Severity:  1-4 = Minimal depression, 5-9 = Mild depression, 10-14 = Moderate depression, 15-19 =  Moderately severe depression, 20-27 = Severe depression   Psychosocial Evaluation and Intervention:  Psychosocial Evaluation - 01/04/22 1030       Psychosocial Evaluation & Interventions   Interventions Encouraged to exercise with the program and follow exercise prescription    Comments Brett Wilson has no barriers to attending the program. He is ready to get started and learn how to manage his heart disease and return to his usual daily activities. He lives with his wife. She is is main support. He has already started losing weight and is working on his nutrition changes.    Expected Outcomes STG Brett Wilson attends all scheduled sessions. He progresses with his exercise and ability to get back to his usual daily activities without concern  LTG Brett Wilson is able to continue his progress after discarge    Continue Psychosocial Services  Follow up required by staff             Psychosocial Re-Evaluation:   Psychosocial Discharge (Final Psychosocial Re-Evaluation):   Vocational Rehabilitation: Provide vocational rehab assistance to qualifying candidates.   Vocational Rehab Evaluation & Intervention:   Education: Education Goals: Education classes will be provided on a variety of topics geared toward better understanding of heart health and risk factor modification. Participant will state understanding/return demonstration of topics presented as noted by education test scores.  Learning Barriers/Preferences:   General Cardiac Education Topics:  AED/CPR: - Group verbal and written instruction with the use of models to demonstrate the basic use of the AED with the basic ABC's of resuscitation.   Anatomy and Cardiac Procedures: - Group verbal and visual presentation and models provide information about basic cardiac anatomy and function. Reviews the testing methods done to diagnose heart disease and the outcomes of the test results. Describes  the treatment choices: Medical Management, Angioplasty, or  Coronary Bypass Surgery for treating various heart conditions including Myocardial Infarction, Angina, Valve Disease, and Cardiac Arrhythmias.  Written material given at graduation. Flowsheet Row Cardiac Rehab from 01/21/2022 in [facility] Cardiac and Pulmonary Rehab  Education need identified 01/21/22       Medication Safety: - Group verbal and visual instruction to review commonly prescribed medications for heart and lung disease. Reviews the medication, class of the drug, and side effects. Includes the steps to properly store meds and maintain the prescription regimen.  Written material given at graduation.   Intimacy: - Group verbal instruction through game format to discuss how heart and lung disease can affect sexual intimacy. Written material given at graduation..   Know Your Numbers and Heart Failure: - Group verbal and visual instruction to discuss disease risk factors for cardiac and pulmonary disease and treatment options.  Reviews associated critical values for Overweight/Obesity, Hypertension, Cholesterol, and Diabetes.  Discusses basics of heart failure: signs/symptoms and treatments.  Introduces Heart Failure Zone chart for action plan for heart failure.  Written material given at graduation.   Infection Prevention: - Provides verbal and written material to individual with discussion of infection control including proper hand washing and proper equipment cleaning during exercise session. Flowsheet Row Cardiac Rehab from 01/21/2022 in [facility] Cardiac and Pulmonary Rehab  Date 01/21/22  Educator [facility]  Instruction Review Code 1- Verbalizes Understanding       Falls Prevention: - Provides verbal and written material to individual with discussion of falls prevention and safety. Flowsheet Row Cardiac Rehab from 01/21/2022 in [facility] Cardiac and Pulmonary Rehab  Date 01/04/22  Educator SB  Instruction Review Code 1- Verbalizes Understanding       Other: -Provides group and verbal  instruction on various topics (see comments)   Knowledge Questionnaire Score:  Knowledge Questionnaire Score - 01/21/22 1402       Knowledge Questionnaire Score   Pre Score 24/26             Core Components/Risk Factors/Patient Goals at Admission:  Personal Goals and Risk Factors at Admission - 01/21/22 1402       Core Components/Risk Factors/Patient Goals on Admission    Weight Management Yes;Weight Loss    Intervention Weight Management: Develop a combined nutrition and exercise program designed to reach desired caloric intake, while maintaining appropriate intake of nutrient and fiber, sodium and fats, and appropriate energy expenditure required for the weight goal.;Weight Management: Provide education and appropriate resources to help participant work on and attain dietary goals.;Weight Management/Obesity: Establish reasonable short term and long term weight goals.    Admit Weight 165 lb 12.8 oz (75.2 kg)    Goal Weight: Short Term 163 lb (73.9 kg)    Goal Weight: Long Term 160 lb (72.6 kg)    Expected Outcomes Short Term: Continue to assess and modify interventions until short term weight is achieved;Long Term: Adherence to nutrition and physical activity/exercise program aimed toward attainment of established weight goal;Weight Loss: Understanding of general recommendations for a balanced deficit meal plan, which promotes 1-2 lb weight loss per week and includes a negative energy balance of [phone] kcal/d;Understanding recommendations for meals to include 15-35% energy as protein, 25-35% energy from fat, 35-60% energy from carbohydrates, less than '200mg'$  of dietary cholesterol, 20-35 gm of total fiber daily;Understanding of distribution of calorie intake throughout the day with the consumption of 4-5 meals/snacks    Hypertension Yes    Intervention Provide education on lifestyle modifcations  including regular physical activity/exercise, weight management, moderate sodium restriction  and increased consumption of fresh fruit, vegetables, and low fat dairy, alcohol moderation, and smoking cessation.;Monitor prescription use compliance.    Expected Outcomes Short Term: Continued assessment and intervention until BP is < 140/37m HG in hypertensive participants. < 130/812mHG in hypertensive participants with diabetes, heart failure or chronic kidney disease.;Long Term: Maintenance of blood pressure at goal levels.    Lipids Yes    Intervention Provide education and support for participant on nutrition & aerobic/resistive exercise along with prescribed medications to achieve LDL '70mg'$ , HDL >'40mg'$ .    Expected Outcomes Short Term: Participant states understanding of desired cholesterol values and is compliant with medications prescribed. Participant is following exercise prescription and nutrition guidelines.;Long Term: Cholesterol controlled with medications as prescribed, with individualized exercise RX and with personalized nutrition plan. Value goals: LDL < '70mg'$ , HDL > 40 mg.             Education:Diabetes - Individual verbal and written instruction to review signs/symptoms of diabetes, desired ranges of glucose level fasting, after meals and with exercise. Acknowledge that pre and post exercise glucose checks will be done for 3 sessions at entry of program.   Core Components/Risk Factors/Patient Goals Review:    Core Components/Risk Factors/Patient Goals at Discharge (Final Review):    ITP Comments:  ITP Comments     Row Name 01/04/22 1034 01/21/22 1348 01/23/22 1005       ITP Comments Virtual orientation call completed today. he has an appointment on Date: 01/21/2022  for EP eval and gym Orientation.  [facility] Completed 6MWT and gym orientation. Initial ITP created and sent for review to Dr. MaEmily FilbertMedical Director. 30 Day review completed. Medical Director ITP review done, changes made as directed, and signed  approval by Medical Director.   NEW              Comments:

## 2022-01-29 ENCOUNTER — Encounter: Payer: Medicare HMO | Admitting: *Deleted

## 2022-01-29 DIAGNOSIS — Z955 Presence of coronary angioplasty implant and graft: Secondary | ICD-10-CM | POA: Diagnosis not present

## 2022-01-29 NOTE — Progress Notes (Addendum)
Daily Session Note  Patient Details  Name: Brett Wilson MRN: [id number] Date of Birth: [birth year] Referring Provider:   Flowsheet Row Cardiac Rehab from 01/21/2022 in [facility] Cardiac and Pulmonary Rehab  Referring Provider Donnelly Angelica MD  [facility] Cardiolgoist: Dr. Sheppard Coil Paraschos]       Encounter Date: 01/29/2022  Check In:  Session Check In - 01/29/22 0948       Check-In   Supervising physician immediately available to respond to emergencies See telemetry face sheet for immediately available ER MD    Location [facility]-Cardiac & Pulmonary Rehab    Staff Present Heath Lark, RN, BSN, CCRP;Jessica [place], MA, RCEP, CCRP, [place], BS, ACSM CEP, Exercise Physiologist    Virtual Visit No    Medication changes reported     No    Fall or balance concerns reported    No    Warm-up and Cool-down Performed on first and last piece of equipment    Resistance Training Performed Yes    VAD Patient? No    PAD/SET Patient? No      Pain Assessment   Currently in Pain? No/denies                Social History   Tobacco Use  Smoking Status Never  Smokeless Tobacco Never    Goals Met:  Independence with exercise equipment Exercise tolerated well No report of concerns or symptoms today  Goals Unmet:  Not Applicable  Comments: First full day of exercise!  Patient was oriented to gym and equipment including functions, settings, policies, and procedures.  Patient's individual exercise prescription and treatment plan were reviewed.  All starting workloads were established based on the results of the 6 minute walk test done at initial orientation visit.  The plan for exercise progression was also introduced and progression will be customized based on patient's performance and goals.    Dr. Emily Filbert is Medical Director for [place].  Dr. Ottie Glazier is Medical Director for [facility] Pulmonary Rehabilitation.

## 2022-01-30 ENCOUNTER — Encounter: Payer: Medicare HMO | Admitting: *Deleted

## 2022-01-30 DIAGNOSIS — Z955 Presence of coronary angioplasty implant and graft: Secondary | ICD-10-CM

## 2022-01-30 NOTE — Progress Notes (Signed)
Daily Session Note  Patient Details  Name: Brett Wilson MRN: [id number] Date of Birth: August 03, 1955 Referring Provider:   Flowsheet Row Cardiac Rehab from 01/21/2022 in [facility] Cardiac and Pulmonary Rehab  Referring Provider Donnelly Angelica MD  [facility] Cardiolgoist: Dr. Sheppard Coil Paraschos]       Encounter Date: 01/30/2022  Check In:  Session Check In - 01/30/22 0953       Check-In   Supervising physician immediately available to respond to emergencies See telemetry face sheet for immediately available ER MD    Location [facility]-Cardiac & Pulmonary Rehab    Staff Present Heath Lark, RN, BSN, CCRP;Jessica [place], MA, RCEP, CCRP, CCET;Noah Tickle, BS, Exercise Physiologist    Virtual Visit No    Medication changes reported     No    Fall or balance concerns reported    No    Warm-up and Cool-down Performed on first and last piece of equipment    Resistance Training Performed Yes    VAD Patient? No    PAD/SET Patient? No      Pain Assessment   Currently in Pain? No/denies                Social History   Tobacco Use  Smoking Status Never  Smokeless Tobacco Never    Goals Met:  Independence with exercise equipment Exercise tolerated well No report of concerns or symptoms today  Goals Unmet:  Not Applicable  Comments: Pt able to follow exercise prescription today without complaint.  Will continue to monitor for progression.    Dr. Emily Filbert is Medical Director for [place].  Dr. Ottie Glazier is Medical Director for [facility] Pulmonary Rehabilitation.

## 2022-01-31 ENCOUNTER — Encounter: Payer: Medicare HMO | Admitting: *Deleted

## 2022-01-31 DIAGNOSIS — Z955 Presence of coronary angioplasty implant and graft: Secondary | ICD-10-CM

## 2022-01-31 NOTE — Progress Notes (Signed)
Daily Session Note  Patient Details  Name: Brett Wilson MRN: [id number] Date of Birth: 02/11/1956 Referring Provider:   Flowsheet Row Cardiac Rehab from 01/21/2022 in [facility] Cardiac and Pulmonary Rehab  Referring Provider Orgel, Ryan MD  [New Cardiolgoist: Dr. Alexander Paraschos]       Encounter Date: 01/31/2022  Check In:  Session Check In - 01/31/22 0951       Check-In   Supervising physician immediately available to respond to emergencies See telemetry face sheet for immediately available ER MD    Location [facility]-Cardiac & Pulmonary Rehab    Staff Present Susanne Bice, RN, BSN, CCRP;Kara Langdon, MS, ASCM CEP, Exercise Physiologist;Melissa Caiola, RDN, LDN;Joseph Hood, RCP,RRT,BSRT    Virtual Visit No    Medication changes reported     No    Fall or balance concerns reported    No    Warm-up and Cool-down Performed on first and last piece of equipment    Resistance Training Performed Yes    VAD Patient? No    PAD/SET Patient? No      Pain Assessment   Currently in Pain? No/denies                Social History   Tobacco Use  Smoking Status Never  Smokeless Tobacco Never    Goals Met:  Exercise tolerated well No report of concerns or symptoms today  Goals Unmet:  Not Applicable  Comments: Pt able to follow exercise prescription today without complaint.  Will continue to monitor for progression.    Dr. Mark Miller is Medical Director for [facility].  Dr. Fuad Aleskerov is Medical Director for [facility] Pulmonary Rehabilitation. 

## 2022-02-04 ENCOUNTER — Telehealth: Payer: Self-pay

## 2022-02-04 NOTE — Telephone Encounter (Signed)
Copied from [employer] [id number]. Topic: General - Inquiry >> Feb 04, 2022 10:32 AM Marcellus Scott wrote: Reason for CRM: Pt stated would like to discuss thyroid issues with Ria Comment. Pt stated no symptoms it's just something that showed up on recent ultrasound for [facility] screening.  Pt is wanting to see if he can be seeing by Ria Comment sooner than appointments available at this time which start in September.   Please advise.

## 2022-02-05 ENCOUNTER — Encounter: Payer: Medicare HMO | Admitting: *Deleted

## 2022-02-05 DIAGNOSIS — Z955 Presence of coronary angioplasty implant and graft: Secondary | ICD-10-CM

## 2022-02-05 NOTE — Progress Notes (Signed)
Daily Session Note  Patient Details  Name: Brett Wilson MRN: [id number] Date of Birth: 07/24/1955 Referring Provider:   Flowsheet Row Cardiac Rehab from 01/21/2022 in [facility] Cardiac and Pulmonary Rehab  Referring Provider Orgel, Ryan MD  [New Cardiolgoist: Dr. Alexander Paraschos]       Encounter Date: 02/05/2022  Check In:  Session Check In - 02/05/22 1004       Check-In   Supervising physician immediately available to respond to emergencies See telemetry face sheet for immediately available ER MD    Location [facility]-Cardiac & Pulmonary Rehab    Staff Present Susanne Bice, RN, BSN, CCRP;Jessica [place], MA, RCEP, CCRP, CCET;Kelly Hayes, BS, ACSM CEP, Exercise Physiologist    Virtual Visit No    Medication changes reported     No    Fall or balance concerns reported    No    Warm-up and Cool-down Performed on first and last piece of equipment    Resistance Training Performed Yes    VAD Patient? No    PAD/SET Patient? No      Pain Assessment   Currently in Pain? No/denies               Exercise Prescription Changes - 02/05/22 0900       Home Exercise Plan   Plans to continue exercise at Home (comment)   walking, biking, weights, bands   Frequency Add 2 additional days to program exercise sessions.    Initial Home Exercises Provided 02/05/22             Social History   Tobacco Use  Smoking Status Never  Smokeless Tobacco Never    Goals Met:  Independence with exercise equipment Exercise tolerated well No report of concerns or symptoms today  Goals Unmet:  Not Applicable  Comments: Pt able to follow exercise prescription today without complaint.  Will continue to monitor for progression.    Dr. Mark Miller is Medical Director for [facility].  Dr. Fuad Aleskerov is Medical Director for [facility]. 

## 2022-02-05 NOTE — Telephone Encounter (Signed)
Appt made 02/07/22 with Ria Comment.

## 2022-02-06 ENCOUNTER — Encounter: Payer: Medicare HMO | Admitting: *Deleted

## 2022-02-06 DIAGNOSIS — Z955 Presence of coronary angioplasty implant and graft: Secondary | ICD-10-CM | POA: Diagnosis not present

## 2022-02-06 NOTE — Progress Notes (Signed)
Daily Session Note  Patient Details  Name: Arch Methot MRN: [id number] Date of Birth: [birth year] Referring Provider:   Flowsheet Row Cardiac Rehab from 01/21/2022 in [facility] Cardiac and Pulmonary Rehab  Referring Provider Donnelly Angelica MD  [facility] Cardiolgoist: Dr. Sheppard Coil Paraschos]       Encounter Date: 02/06/2022  Check In:  Session Check In - 02/06/22 1028       Check-In   Supervising physician immediately available to respond to emergencies See telemetry face sheet for immediately available ER MD    Location [facility]-Cardiac & Pulmonary Rehab    Staff Present Heath Lark, RN, BSN, CCRP;Jessica [place], MA, RCEP, CCRP, CCET;Melissa [place], [place], LDN;Joseph [place], [place]    Virtual Visit No    Medication changes reported     No    Fall or balance concerns reported    No    Warm-up and Cool-down Performed on first and last piece of equipment    Resistance Training Performed Yes    VAD Patient? No    PAD/SET Patient? No      Pain Assessment   Currently in Pain? No/denies                Social History   Tobacco Use  Smoking Status Never  Smokeless Tobacco Never    Goals Met:  Independence with exercise equipment Exercise tolerated well No report of concerns or symptoms today  Goals Unmet:  Not Applicable  Comments: Pt able to follow exercise prescription today without complaint.  Will continue to monitor for progression.    Dr. Emily Filbert is Medical Director for [place].  Dr. Ottie Glazier is Medical Director for [facility] Pulmonary Rehabilitation.

## 2022-02-07 ENCOUNTER — Encounter: Payer: Self-pay | Admitting: Physician Assistant

## 2022-02-07 ENCOUNTER — Ambulatory Visit (INDEPENDENT_AMBULATORY_CARE_PROVIDER_SITE_OTHER): Payer: Medicare HMO | Admitting: Physician Assistant

## 2022-02-07 VITALS — BP 137/81 | HR 84 | Temp 98.8°F | Resp 16 | Wt 166.8 lb

## 2022-02-07 DIAGNOSIS — G4709 Other insomnia: Secondary | ICD-10-CM

## 2022-02-07 DIAGNOSIS — E041 Nontoxic single thyroid nodule: Secondary | ICD-10-CM

## 2022-02-07 DIAGNOSIS — K7689 Other specified diseases of liver: Secondary | ICD-10-CM

## 2022-02-07 MED ORDER — ZOLPIDEM TARTRATE 5 MG PO TABS: 5.0000 mg | ORAL_TABLET | Freq: Every evening | ORAL | 1 refills | Status: DC | PRN

## 2022-02-07 NOTE — Assessment & Plan Note (Signed)
Ordered thyroid US to further characterize

## 2022-02-07 NOTE — Progress Notes (Signed)
I,[name],acting as a Education administrator for [employer], [name].,have documented all relevant documentation on the behalf of [name], [name],as directed by  [name], [name] while in the presence of [name], [name].    Established patient visit   Patient: Brett Wilson   DOB: Sep 06, 1955   66 y.o. Male  MRN: [id number] Visit Date: 02/07/2022  Today's healthcare provider: [name], [name]   Chief Complaint  Patient presents with   Thyroid Problem   Subjective    HPI  Patient is being seen today for possible thyroid mass. Patient had a carotid artery screening on 01/29/22 through [place] [facility]. Patient was advised that an incidental finding of mass on thyroid. He also had a CT CA score that incidentally saw " There are multiple subcentimeter 1 low-attenuation foci within the imaged portions of the dome of liver. These are all too small to reliably characterize."  Pt also admits to significant insomnia since recent cardiac cath and stent, and now subsequent abnormalities on imaging.   Medications: Outpatient Medications Prior to Visit  Medication Sig   aspirin EC 81 MG tablet Take 81 mg by mouth daily. Swallow whole.   clopidogrel (PLAVIX) 75 MG tablet Take 1 tablet (75 mg total) by mouth daily.   cyclobenzaprine (FLEXERIL) 5 MG tablet Take 1 tablet (5 mg total) by mouth at bedtime.   finasteride (PROSCAR) 5 MG tablet Take 1 tablet (5 mg total) by mouth at bedtime.   hydrocortisone 2.5 % lotion Apply topically 3 (three) times a week. Apply to aa face 3 nights weekly, Tuesday, Thursday and Saturday   losartan (COZAAR) 100 MG tablet TAKE 1 TABLET BY MOUTH EVERY DAY   metoprolol succinate (TOPROL-XL) 25 MG 24 hr tablet Take 25 mg by mouth daily.   Multiple Vitamin (MULTIVITAMIN WITH MINERALS) TABS tablet Take 1 tablet by mouth daily.   rosuvastatin (CRESTOR) 40 MG tablet Take 1 tablet (40 mg total) by mouth daily.   Zinc 50 MG  TABS Take 50 mg by mouth daily.   No facility-administered medications prior to visit.    Review of Systems  Constitutional:  Negative for appetite change, chills and fatigue.  HENT:  Negative for congestion and trouble swallowing.   Respiratory:  Negative for cough, chest tightness and shortness of breath.   Cardiovascular:  Negative for chest pain and palpitations.    Objective    BP 137/81 (BP Location: Right Arm, Patient Position: Sitting, Cuff Size: Large)   Pulse 84   Temp 98.8 F (37.1 C) (Oral)   Resp 16   Wt 166 lb 12.8 oz (75.7 kg)   BMI 24.78 kg/m    Physical Exam Vitals reviewed.  Constitutional:      Appearance: He is not ill-appearing.  HENT:     Head: Normocephalic.  Eyes:     Conjunctiva/sclera: Conjunctivae normal.  Neck:     Thyroid: No thyroid mass, thyromegaly or thyroid tenderness.  Cardiovascular:     Rate and Rhythm: Normal rate.  Pulmonary:     Effort: Pulmonary effort is normal. No respiratory distress.  Neurological:     General: No focal deficit present.     Mental Status: He is alert and oriented to person, place, and time.  Psychiatric:        Mood and Affect: Mood normal.        Behavior: Behavior normal.      No results found for any visits on  02/07/22.  Assessment & Plan     Problem List Items Addressed This Visit       Digestive   Liver nodule    Incidental on CTA reassured pt likely not anything of concern Ordered RUQ [place]      Relevant Orders   US Abdomen Limited RUQ (LIVER/GB)     Endocrine   Thyroid nodule incidentally noted on imaging study - Primary    Ordered thyroid US to further characterize      Relevant Orders   US THYROID     Other   Other insomnia    2/2 ongoing health changes and stress. Previously had taken ambien 5 mg, 1/2 tab. Rx #15 with 1 refill       Relevant Medications   zolpidem (AMBIEN) 5 MG tablet     Return if symptoms worsen or fail to improve. And at next sched appt     I,  [name], [name] have reviewed all documentation for this visit. The documentation on 02/07/2022 for the exam, diagnosis, procedures, and orders are all accurate and complete.  [name], [name] [facility] [address] [place], [place], [address] Office: [phone] Fax: [place]

## 2022-02-07 NOTE — Assessment & Plan Note (Signed)
2/2 ongoing health changes and stress. Previously had taken ambien 5 mg, 1/2 tab. Rx #15 with 1 refill

## 2022-02-07 NOTE — Assessment & Plan Note (Signed)
Incidental on CTA reassured pt likely not anything of concern Ordered RUQ [place]

## 2022-02-08 ENCOUNTER — Other Ambulatory Visit: Payer: Self-pay | Admitting: Ophthalmology

## 2022-02-08 DIAGNOSIS — H539 Unspecified visual disturbance: Secondary | ICD-10-CM

## 2022-02-08 DIAGNOSIS — D352 Benign neoplasm of pituitary gland: Secondary | ICD-10-CM

## 2022-02-12 DIAGNOSIS — H35373 Puckering of macula, bilateral: Secondary | ICD-10-CM | POA: Insufficient documentation

## 2022-02-12 DIAGNOSIS — H43813 Vitreous degeneration, bilateral: Secondary | ICD-10-CM | POA: Insufficient documentation

## 2022-02-12 DIAGNOSIS — H469 Unspecified optic neuritis: Secondary | ICD-10-CM | POA: Insufficient documentation

## 2022-02-12 DIAGNOSIS — Z961 Presence of intraocular lens: Secondary | ICD-10-CM | POA: Insufficient documentation

## 2022-02-13 ENCOUNTER — Encounter: Payer: Medicare HMO | Admitting: *Deleted

## 2022-02-13 DIAGNOSIS — Z955 Presence of coronary angioplasty implant and graft: Secondary | ICD-10-CM

## 2022-02-13 NOTE — Progress Notes (Signed)
Daily Session Note  Patient Details  Name: Brett Wilson MRN: [id number] Date of Birth: [birth year] Referring Provider:   Flowsheet Row Cardiac Rehab from 01/21/2022 in [facility] Cardiac and Pulmonary Rehab  Referring Provider Donnelly Angelica MD  [facility] Cardiolgoist: Dr. Sheppard Coil Paraschos]       Encounter Date: 02/13/2022  Check In:  Session Check In - 02/13/22 1017       Check-In   Supervising physician immediately available to respond to emergencies See telemetry face sheet for immediately available ER MD    Location [facility]-Cardiac & Pulmonary Rehab    Staff Present Nyoka Cowden, RN, BSN, Fenton Foy, BS, Exercise Physiologist;Joseph [place], RCP,RRT,BSRT;Melissa [place], [place], LDN    Virtual Visit No    Medication changes reported     No    Fall or balance concerns reported    No    Tobacco Cessation No Change    Warm-up and Cool-down Performed on first and last piece of equipment    Resistance Training Performed Yes    VAD Patient? No    PAD/SET Patient? No      Pain Assessment   Currently in Pain? No/denies                Social History   Tobacco Use  Smoking Status Never  Smokeless Tobacco Never    Goals Met:  Independence with exercise equipment Exercise tolerated well No report of concerns or symptoms today  Goals Unmet:  Not Applicable  Comments: Pt able to follow exercise prescription today without complaint.  Will continue to monitor for progression.    Dr. Emily Filbert is Medical Director for [place].  Dr. Ottie Glazier is Medical Director for [facility] Pulmonary Rehabilitation.

## 2022-02-14 ENCOUNTER — Encounter: Payer: Medicare HMO | Admitting: *Deleted

## 2022-02-14 ENCOUNTER — Ambulatory Visit: Admission: RE | Admit: 2022-02-14 | Payer: Medicare HMO | Source: Ambulatory Visit

## 2022-02-14 DIAGNOSIS — Z955 Presence of coronary angioplasty implant and graft: Secondary | ICD-10-CM | POA: Diagnosis not present

## 2022-02-14 NOTE — Progress Notes (Signed)
Daily Session Note  Patient Details  Name: Maher Shon MRN: [id number] Date of Birth: [birth year] Referring Provider:   Flowsheet Row Cardiac Rehab from 01/21/2022 in [facility] Cardiac and Pulmonary Rehab  Referring Provider Donnelly Angelica MD  [facility] Cardiolgoist: Dr. Sheppard Coil Paraschos]       Encounter Date: 02/14/2022  Check In:  Session Check In - 02/14/22 1243       Check-In   Supervising physician immediately available to respond to emergencies See telemetry face sheet for immediately available ER MD    Location [facility]-Cardiac & Pulmonary Rehab    Staff Present Nyoka Cowden, RN, BSN, Lauretta [place]ill, RCP,RRT,BSRT;Melissa [place], [place], LDN    Virtual Visit No    Medication changes reported     No    Fall or balance concerns reported    No    Tobacco Cessation No Change    Warm-up and Cool-down Performed on first and last piece of equipment    Resistance Training Performed Yes    VAD Patient? No    PAD/SET Patient? No      Pain Assessment   Currently in Pain? No/denies                Social History   Tobacco Use  Smoking Status Never  Smokeless Tobacco Never    Goals Met:  Independence with exercise equipment Exercise tolerated well No report of concerns or symptoms today  Goals Unmet:  Not Applicable  Comments: Pt able to follow exercise prescription today without complaint.  Will continue to monitor for progression.    Dr. Emily Filbert is Medical Director for [place].  Dr. Ottie Glazier is Medical Director for [facility] Pulmonary Rehabilitation.

## 2022-02-19 ENCOUNTER — Encounter: Payer: Medicare HMO | Attending: Cardiology | Admitting: *Deleted

## 2022-02-19 DIAGNOSIS — Z48812 Encounter for surgical aftercare following surgery on the circulatory system: Secondary | ICD-10-CM | POA: Insufficient documentation

## 2022-02-19 DIAGNOSIS — Z955 Presence of coronary angioplasty implant and graft: Secondary | ICD-10-CM | POA: Insufficient documentation

## 2022-02-19 NOTE — Progress Notes (Signed)
Daily Session Note  Patient Details  Name: Brett Wilson MRN: [id number] Date of Birth: 28-Jun-1955 Referring Provider:   Flowsheet Row Cardiac Rehab from 01/21/2022 in [facility] Cardiac and Pulmonary Rehab  Referring Provider Donnelly Angelica MD  [facility] Cardiolgoist: Dr. Sheppard Coil Paraschos]       Encounter Date: 02/19/2022  Check In:  Session Check In - 02/19/22 0929       Check-In   Supervising physician immediately available to respond to emergencies See telemetry face sheet for immediately available ER MD    Location [facility]-Cardiac & Pulmonary Rehab    Staff Present Nyoka Cowden, RN, BSN, Fenton Foy, BS, Exercise Physiologist;Jessica [place], MA, RCEP, CCRP, CCET    Virtual Visit No    Medication changes reported     No    Fall or balance concerns reported    No    Tobacco Cessation No Change    Warm-up and Cool-down Performed on first and last piece of equipment    Resistance Training Performed Yes    VAD Patient? No    PAD/SET Patient? No      Pain Assessment   Currently in Pain? No/denies                Social History   Tobacco Use  Smoking Status Never  Smokeless Tobacco Never    Goals Met:  Independence with exercise equipment Exercise tolerated well No report of concerns or symptoms today  Goals Unmet:  Not Applicable  Comments: Pt able to follow exercise prescription today without complaint.  Will continue to monitor for progression.    Dr. Emily Filbert is Medical Director for [place].  Dr. Ottie Glazier is Medical Director for [facility] Pulmonary Rehabilitation.

## 2022-02-20 ENCOUNTER — Encounter: Payer: Self-pay | Admitting: *Deleted

## 2022-02-20 ENCOUNTER — Encounter: Payer: Medicare HMO | Admitting: *Deleted

## 2022-02-20 VITALS — Ht 68.8 in | Wt 165.6 lb

## 2022-02-20 DIAGNOSIS — Z955 Presence of coronary angioplasty implant and graft: Secondary | ICD-10-CM | POA: Diagnosis not present

## 2022-02-20 NOTE — Patient Instructions (Signed)
Discharge Patient Instructions  Patient Details  Name: Brett Wilson MRN: [id number] Date of Birth: 05-31-1956 Referring Provider:  [name], [name]   Number of Visits: 13   Reason for Discharge:  Patient reached a stable level of exercise. Patient independent in their exercise. Patient has met program and personal goals.  Smoking History:  Social History   Tobacco Use  Smoking Status Never  Smokeless Tobacco Never    Diagnosis:  Status post coronary artery stent placement  Initial Exercise Prescription:  Initial Exercise Prescription - 01/21/22 1300       Date of Initial Exercise RX and Referring Provider   Date 01/21/22    Referring Provider Donnelly Angelica MD   New Cardiolgoist: Dr. Isaias Cowman     Oxygen   Maintain Oxygen Saturation 88% or higher      Treadmill   MPH 2.6    Grade 2    Minutes 15    METs 3.71      Elliptical   Level 1    Speed 3.6    Minutes 15    METs 3.7      REL-XR   Level 3    Speed 50    Minutes 15    METs 3.7      Prescription Details   Frequency (times per week) 3    Duration Progress to 30 minutes of continuous aerobic without signs/symptoms of physical distress      Intensity   THRR 40-80% of Max Heartrate 110-139    Ratings of Perceived Exertion 11-13    Perceived Dyspnea 0-4      Progression   Progression Continue to progress workloads to maintain intensity without signs/symptoms of physical distress.      Resistance Training   Training Prescription Yes    Weight 5 lb    Reps 10-15             Discharge Exercise Prescription (Final Exercise Prescription Changes):  Exercise Prescription Changes - 02/12/22 1500       Response to Exercise   Blood Pressure (Admit) 130/72    Blood Pressure (Exercise) 146/78    Blood Pressure (Exit) 102/64    Heart Rate (Admit) 91 bpm    Heart Rate (Exercise) 136 bpm    Heart Rate (Exit) 75 bpm    Rating of Perceived Exertion (Exercise) 11    Symptoms none     Comments 4th full day of exercise    Duration Continue with 30 min of aerobic exercise without signs/symptoms of physical distress.    Intensity THRR unchanged      Progression   Progression Continue to progress workloads to maintain intensity without signs/symptoms of physical distress.    Average METs 3.76      Resistance Training   Training Prescription Yes    Weight 6 lb    Reps 10-15      Interval Training   Interval Training No      Treadmill   MPH 3.2    Grade 0.5    Minutes 15    METs 3.67      Elliptical   Level 2.9    Speed 3.6    Minutes 15      REL-XR   Level 4    Minutes 15    METs 3.6      Home Exercise Plan   Plans to continue exercise at Home (comment)   walking, biking, weights, bands   Frequency Add 2 additional days to program  exercise sessions.    Initial Home Exercises Provided 02/05/22      Oxygen   Maintain Oxygen Saturation 88% or higher             Functional Capacity:  6 Minute Walk     Row Name 01/21/22 1350 02/20/22 1200       6 Minute Walk   Phase Initial Discharge    Distance 1400 feet 1820 feet    Distance % Change -- 30 %    Distance Feet Change -- 420 ft    Walk Time 6 minutes 6 minutes    # of Rest Breaks 0 0    MPH 2.65 3.44    METS 3.78 4.32    RPE 9 11    VO2 Peak 13.24 15.11    Symptoms No No    Resting HR 81 bpm 72 bpm    Resting BP 152/76 122/64    Resting Oxygen Saturation  99 % --    Exercise Oxygen Saturation  during 6 min walk 98 % --    Max Ex. HR 101 bpm 110 bpm    Max Ex. BP 168/82 130/76    2 Minute Post BP 154/78 118/68           Nutrition & Weight - Outcomes:  Pre Biometrics - 01/21/22 1400       Pre Biometrics   Height 5' 8.8" (1.748 m)    Weight 169 lb 12.8 oz (77 kg)    BMI (Calculated) 25.21    Single Leg Stand 15 seconds             Post Biometrics - 02/20/22 1200        Post  Biometrics   Height 5' 8.8" (1.748 m)    Weight 165 lb 9.6 oz (75.1 kg)    BMI (Calculated)  24.58    Single Leg Stand 10.1 seconds            Goals reviewed with patient; copy given to patient.

## 2022-02-20 NOTE — Progress Notes (Signed)
Daily Session Note  Patient Details  Name: Brett Wilson MRN: [id number] Date of Birth: 01/23/1956 Referring Provider:   Flowsheet Row Cardiac Rehab from 01/21/2022 in [facility]  Referring Provider Orgel, Ryan MD  [New Cardiolgoist: Dr. Alexander Paraschos]       Encounter Date: 02/20/2022  Check In:  Session Check In - 02/20/22 1124       Check-In   Supervising physician immediately available to respond to emergencies See telemetry face sheet for immediately available ER MD    Location [facility]    Staff Present Noah Tickle, BS, Exercise Physiologist;Joseph Hood, RCP,RRT,BSRT;Susanne Bice, RN, BSN, CCRP    Virtual Visit No    Medication changes reported     No    Fall or balance concerns reported    No    Warm-up and Cool-down Performed on first and last piece of equipment    Resistance Training Performed Yes    VAD Patient? No    PAD/SET Patient? No      Pain Assessment   Currently in Pain? No/denies                Social History   Tobacco Use  Smoking Status Never  Smokeless Tobacco Never    Goals Met:  Independence with exercise equipment Exercise tolerated well No report of concerns or symptoms today  Goals Unmet:  Not Applicable  Comments: Pt able to follow exercise prescription today without complaint.  Will continue to monitor for progression.    Dr. Mark Miller is Medical Director for [facility].  Dr. Fuad Aleskerov is Medical Director for [facility]. 

## 2022-02-20 NOTE — Progress Notes (Signed)
Cardiac Individual Treatment Plan  Patient Details  Name: Brett Wilson MRN: [id number] Date of Birth: February 08, 1956 Referring Provider:   Flowsheet Row Cardiac Rehab from 01/21/2022 in [facility] Cardiac and Pulmonary Rehab  Referring Provider Donnelly Angelica MD  [facility] Cardiolgoist: Dr. Sheppard Coil Paraschos]       Initial Encounter Date:  Flowsheet Row Cardiac Rehab from 01/21/2022 in [facility] Cardiac and Pulmonary Rehab  Date 01/21/22       Visit Diagnosis: Status post coronary artery stent placement  Patient's Home Medications on Admission:  Current Outpatient Medications:    aspirin EC 81 MG tablet, Take 81 mg by mouth daily. Swallow whole., Disp: , Rfl:    clopidogrel (PLAVIX) 75 MG tablet, Take 1 tablet (75 mg total) by mouth daily., Disp: 90 tablet, Rfl: 3   cyclobenzaprine (FLEXERIL) 5 MG tablet, Take 1 tablet (5 mg total) by mouth at bedtime., Disp: 30 tablet, Rfl: 2   finasteride (PROSCAR) 5 MG tablet, Take 1 tablet (5 mg total) by mouth at bedtime., Disp: 90 tablet, Rfl: 3   hydrocortisone 2.5 % lotion, Apply topically 3 (three) times a week. Apply to aa face 3 nights weekly, Tuesday, Thursday and Saturday, Disp: 59 mL, Rfl: 6   losartan (COZAAR) 100 MG tablet, TAKE 1 TABLET BY MOUTH EVERY DAY, Disp: 90 tablet, Rfl: 1   metoprolol succinate (TOPROL-XL) 25 MG 24 hr tablet, Take 25 mg by mouth daily., Disp: , Rfl:    Multiple Vitamin (MULTIVITAMIN WITH MINERALS) TABS tablet, Take 1 tablet by mouth daily., Disp: , Rfl:    rosuvastatin (CRESTOR) 40 MG tablet, Take 1 tablet (40 mg total) by mouth daily., Disp: 90 tablet, Rfl: 3   Zinc 50 MG TABS, Take 50 mg by mouth daily., Disp: , Rfl:    zolpidem (AMBIEN) 5 MG tablet, Take 1 tablet (5 mg total) by mouth at bedtime as needed for sleep., Disp: 15 tablet, Rfl: 1  Past Medical History: Past Medical History:  Diagnosis Date   Allergy    Dysplastic nevus 01/17/2020   Left mid side. Severe atypia with scar, close to margin. exc 03/14/2020    Dysplastic nevus 01/17/2020   Left calf. Moderate atypia, deep margin involved.    Dysplastic nevus 01/17/2020   LUQ abdomen. Mild atypia, limited margins free.    Hyperlipidemia    Hypertension    Pituitary adenoma ([place])     Tobacco Use: Social History   Tobacco Use  Smoking Status Never  Smokeless Tobacco Never    Labs: Review Flowsheet       Latest Ref Rng & Units 07/22/2018 09/01/2019 09/01/2020 09/05/2021  Labs for ITP Cardiac and Pulmonary Rehab  Cholestrol 100 - 199 mg/dL 183     179  184  168   LDL (calc) 0 - 99 mg/dL 108     102  119  102   HDL-C >39 mg/dL 43     43  38  45   Trlycerides 0 - 149 mg/dL - 197  148  119   Hemoglobin A1c 4.8 - 5.6 % - - - 5.9     Details       This result is from an external source.          Exercise Target Goals: Exercise Program Goal: Individual exercise prescription set using results from initial 6 min walk test and THRR while considering  patient's activity barriers and safety.   Exercise Prescription Goal: Initial exercise prescription builds to 30-45 minutes a day of aerobic  activity, 2-3 days per week.  Home exercise guidelines will be given to patient during program as part of exercise prescription that the participant will acknowledge.   Education: Aerobic Exercise: - Group verbal and visual presentation on the components of exercise prescription. Introduces F.I.T.T principle from ACSM for exercise prescriptions.  Reviews F.I.T.T. principles of aerobic exercise including progression. Written material given at graduation. Flowsheet Row Cardiac Rehab from 02/13/2022 in [facility] Cardiac and Pulmonary Rehab  Education need identified 01/21/22       Education: Resistance Exercise: - Group verbal and visual presentation on the components of exercise prescription. Introduces F.I.T.T principle from ACSM for exercise prescriptions  Reviews F.I.T.T. principles of resistance exercise including progression. Written material given at  graduation.    Education: Exercise & Equipment Safety: - Individual verbal instruction and demonstration of equipment use and safety with use of the equipment. Flowsheet Row Cardiac Rehab from 02/13/2022 in [facility] Cardiac and Pulmonary Rehab  Date 01/21/22  Educator [facility]  Instruction Review Code 1- Verbalizes Understanding       Education: Exercise Physiology & General Exercise Guidelines: - Group verbal and written instruction with models to review the exercise physiology of the cardiovascular system and associated critical values. Provides general exercise guidelines with specific guidelines to those with heart or lung disease.  Flowsheet Row Cardiac Rehab from 02/13/2022 in [facility] Cardiac and Pulmonary Rehab  Date 02/13/22  Educator [facility]  Instruction Review Code 1- Verbalizes Understanding       Education: Flexibility, Balance, Mind/Body Relaxation: - Group verbal and visual presentation with interactive activity on the components of exercise prescription. Introduces F.I.T.T principle from ACSM for exercise prescriptions. Reviews F.I.T.T. principles of flexibility and balance exercise training including progression. Also discusses the mind body connection.  Reviews various relaxation techniques to help reduce and manage stress (i.e. Deep breathing, progressive muscle relaxation, and visualization). Balance handout provided to take home. Written material given at graduation.   Activity Barriers & Risk Stratification:  Activity Barriers & Cardiac Risk Stratification - 01/21/22 1351       Activity Barriers & Cardiac Risk Stratification   Activity Barriers Other (comment);Balance Concerns;Muscular Weakness;Neck/Spine Problems    Comments partially torn L Medial menincus, buldging disc    Cardiac Risk Stratification Moderate             6 Minute Walk:  6 Minute Walk     Row Name 01/21/22 1350 02/20/22 1200       6 Minute Walk   Phase Initial Discharge    Distance 1400 feet 1820  feet    Distance % Change -- 30 %    Distance Feet Change -- 420 ft    Walk Time 6 minutes 6 minutes    # of Rest Breaks 0 0    MPH 2.65 3.44    METS 3.78 4.32    RPE 9 11    VO2 Peak 13.24 15.11    Symptoms No No    Resting HR 81 bpm 72 bpm    Resting BP 152/76 122/64    Resting Oxygen Saturation  99 % --    Exercise Oxygen Saturation  during 6 min walk 98 % --    Max Ex. HR 101 bpm 110 bpm    Max Ex. BP 168/82 130/76    2 Minute Post BP 154/78 118/68             Oxygen Initial Assessment:   Oxygen Re-Evaluation:   Oxygen Discharge (Final Oxygen Re-Evaluation):  Initial Exercise Prescription:  Initial Exercise Prescription - 01/21/22 1300       Date of Initial Exercise RX and Referring Provider   Date 01/21/22    Referring Provider Donnelly Angelica MD   New Cardiolgoist: Dr. Isaias Cowman     Oxygen   Maintain Oxygen Saturation 88% or higher      Treadmill   MPH 2.6    Grade 2    Minutes 15    METs 3.71      Elliptical   Level 1    Speed 3.6    Minutes 15    METs 3.7      REL-XR   Level 3    Speed 50    Minutes 15    METs 3.7      Prescription Details   Frequency (times per week) 3    Duration Progress to 30 minutes of continuous aerobic without signs/symptoms of physical distress      Intensity   THRR 40-80% of Max Heartrate 110-139    Ratings of Perceived Exertion 11-13    Perceived Dyspnea 0-4      Progression   Progression Continue to progress workloads to maintain intensity without signs/symptoms of physical distress.      Resistance Training   Training Prescription Yes    Weight 5 lb    Reps 10-15             Perform Capillary Blood Glucose checks as needed.  Exercise Prescription Changes:   Exercise Prescription Changes     Row Name 01/21/22 1300 02/05/22 0900 02/12/22 1500         Response to Exercise   Blood Pressure (Admit) 152/76 -- 130/72     Blood Pressure (Exercise) 168/82 -- 146/78     Blood Pressure  (Exit) 154/78 -- 102/64     Heart Rate (Admit) 81 bpm -- 91 bpm     Heart Rate (Exercise) 101 bpm -- 136 bpm     Heart Rate (Exit) 84 bpm -- 75 bpm     Oxygen Saturation (Admit) 99 % -- --     Oxygen Saturation (Exercise) 98 % -- --     Rating of Perceived Exertion (Exercise) 9 -- 11     Symptoms none -- none     Comments walk test results -- 4th full day of exercise     Duration -- -- Continue with 30 min of aerobic exercise without signs/symptoms of physical distress.     Intensity -- -- THRR unchanged       Progression   Progression -- -- Continue to progress workloads to maintain intensity without signs/symptoms of physical distress.     Average METs -- -- 3.76       Resistance Training   Training Prescription -- -- Yes     Weight -- -- 6 lb     Reps -- -- 10-15       Interval Training   Interval Training -- -- No       Treadmill   MPH -- -- 3.2     Grade -- -- 0.5     Minutes -- -- 15     METs -- -- 3.67       Elliptical   Level -- -- 2.9     Speed -- -- 3.6     Minutes -- -- 15       REL-XR   Level -- -- 4     Minutes -- -- 15  METs -- -- 3.6       Home Exercise Plan   Plans to continue exercise at -- Home (comment)  walking, biking, weights, bands Home (comment)  walking, biking, weights, bands     Frequency -- Add 2 additional days to program exercise sessions. Add 2 additional days to program exercise sessions.     Initial Home Exercises Provided -- 02/05/22 02/05/22       Oxygen   Maintain Oxygen Saturation -- -- 88% or higher              Exercise Comments:   Exercise Comments     Row Name 01/29/22 1150           Exercise Comments First full day of exercise!  Patient was oriented to gym and equipment including functions, settings, policies, and procedures.  Patient's individual exercise prescription and treatment plan were reviewed.  All starting workloads were established based on the results of the 6 minute walk test done at initial  orientation visit.  The plan for exercise progression was also introduced and progression will be customized based on patient's performance and goals.                Exercise Goals and Review:   Exercise Goals     Row Name 01/21/22 1400             Exercise Goals   Increase Physical Activity Yes       Intervention Provide advice, education, support and counseling about physical activity/exercise needs.;Develop an individualized exercise prescription for aerobic and resistive training based on initial evaluation findings, risk stratification, comorbidities and participant's personal goals.       Expected Outcomes Short Term: Attend rehab on a regular basis to increase amount of physical activity.;Long Term: Add in home exercise to make exercise part of routine and to increase amount of physical activity.;Long Term: Exercising regularly at least 3-5 days a week.       Increase Strength and Stamina Yes       Intervention Provide advice, education, support and counseling about physical activity/exercise needs.;Develop an individualized exercise prescription for aerobic and resistive training based on initial evaluation findings, risk stratification, comorbidities and participant's personal goals.       Expected Outcomes Short Term: Increase workloads from initial exercise prescription for resistance, speed, and METs.;Short Term: Perform resistance training exercises routinely during rehab and add in resistance training at home;Long Term: Improve cardiorespiratory fitness, muscular endurance and strength as measured by increased METs and functional capacity (6MWT)       Able to understand and use rate of perceived exertion (RPE) scale Yes       Intervention Provide education and explanation on how to use RPE scale       Expected Outcomes Short Term: Able to use RPE daily in rehab to express subjective intensity level;Long Term:  Able to use RPE to guide intensity level when exercising  independently       Able to understand and use Dyspnea scale Yes       Intervention Provide education and explanation on how to use Dyspnea scale       Expected Outcomes Short Term: Able to use Dyspnea scale daily in rehab to express subjective sense of shortness of breath during exertion;Long Term: Able to use Dyspnea scale to guide intensity level when exercising independently       Knowledge and understanding of Target Heart Rate Range (THRR) Yes  Intervention Provide education and explanation of THRR including how the numbers were predicted and where they are located for reference       Expected Outcomes Short Term: Able to state/look up THRR;Short Term: Able to use daily as guideline for intensity in rehab;Long Term: Able to use THRR to govern intensity when exercising independently       Able to check pulse independently Yes       Intervention Provide education and demonstration on how to check pulse in carotid and radial arteries.;Review the importance of being able to check your own pulse for safety during independent exercise       Expected Outcomes Short Term: Able to explain why pulse checking is important during independent exercise;Long Term: Able to check pulse independently and accurately       Understanding of Exercise Prescription Yes       Intervention Provide education, explanation, and written materials on patient's individual exercise prescription       Expected Outcomes Long Term: Able to explain home exercise prescription to exercise independently;Short Term: Able to explain program exercise prescription                Exercise Goals Re-Evaluation :  Exercise Goals Re-Evaluation     [place] Name 01/29/22 1149 02/05/22 0927 02/12/22 1513         Exercise Goal Re-Evaluation   Exercise Goals Review Able to understand and use rate of perceived exertion (RPE) scale;Knowledge and understanding of Target Heart Rate Range (THRR);Understanding of Exercise Prescription;Able  to understand and use Dyspnea scale Increase Physical Activity;Increase Strength and Stamina;Able to understand and use rate of perceived exertion (RPE) scale;Able to understand and use Dyspnea scale;Knowledge and understanding of Target Heart Rate Range (THRR);Able to check pulse independently;Understanding of Exercise Prescription Increase Physical Activity;Increase Strength and Stamina;Understanding of Exercise Prescription     Comments Reviewed RPE and dyspnea scales, THR and program prescription with pt today.  Pt voiced understanding and was given a copy of goals to take home. Brett Wilson is off to a good start in rehab.  He has already started to notice a change in his stamina.  Reviewed home exercise with pt today.  Pt plans to walk and use bikes at home for exercise.  He also has weights and bands to use.  Reviewed THR, pulse, RPE, sign and symptoms, pulse oximetery and when to call 911 or MD.  Also discussed weather considerations and indoor options.  Pt voiced understanding. Brett Wilson continues to do well in rehab. He has already been able to increase to using 6 lbs for handweights. He has also tried the elliptical and was able to tolerate it well. He bumped up his speed on the treadmill to  to 3.2 speed with a small incline. We will continue to monitor.     Expected Outcomes Short: Use RPE daily to regulate intensity. Long: Follow program prescription in THR. Short: Start to add in more exercise at home Long: Conitnue to improve stamina Short: Continue to work up load on the elliptical, watch HR Long: Continue to increase overall MET level              Discharge Exercise Prescription (Final Exercise Prescription Changes):  Exercise Prescription Changes - 02/12/22 1500       Response to Exercise   Blood Pressure (Admit) 130/72    Blood Pressure (Exercise) 146/78    Blood Pressure (Exit) 102/64    Heart Rate (Admit) 91 bpm    Heart Rate (Exercise)  136 bpm    Heart Rate (Exit) 75 bpm    Rating of  Perceived Exertion (Exercise) 11    Symptoms none    Comments 4th full day of exercise    Duration Continue with 30 min of aerobic exercise without signs/symptoms of physical distress.    Intensity THRR unchanged      Progression   Progression Continue to progress workloads to maintain intensity without signs/symptoms of physical distress.    Average METs 3.76      Resistance Training   Training Prescription Yes    Weight 6 lb    Reps 10-15      Interval Training   Interval Training No      Treadmill   MPH 3.2    Grade 0.5    Minutes 15    METs 3.67      Elliptical   Level 2.9    Speed 3.6    Minutes 15      REL-XR   Level 4    Minutes 15    METs 3.6      Home Exercise Plan   Plans to continue exercise at Home (comment)   walking, biking, weights, bands   Frequency Add 2 additional days to program exercise sessions.    Initial Home Exercises Provided 02/05/22      Oxygen   Maintain Oxygen Saturation 88% or higher             Nutrition:  Target Goals: Understanding of nutrition guidelines, daily intake of sodium '1500mg'$ , cholesterol '200mg'$ , calories 30% from fat and 7% or less from saturated fats, daily to have 5 or more servings of fruits and vegetables.  Education: All About Nutrition: -Group instruction provided by verbal, written material, interactive activities, discussions, models, and posters to present general guidelines for heart healthy nutrition including fat, fiber, MyPlate, the role of sodium in heart healthy nutrition, utilization of the nutrition label, and utilization of this knowledge for meal planning. Follow up email sent as well. Written material given at graduation.   Biometrics:  Pre Biometrics - 01/21/22 1400       Pre Biometrics   Height 5' 8.8" (1.748 m)    Weight 169 lb 12.8 oz (77 kg)    BMI (Calculated) 25.21    Single Leg Stand 15 seconds             Post Biometrics - 02/20/22 1200        Post  Biometrics   Height  5' 8.8" (1.748 m)    Weight 165 lb 9.6 oz (75.1 kg)    BMI (Calculated) 24.58    Single Leg Stand 10.1 seconds             Nutrition Therapy Plan and Nutrition Goals:  Nutrition Therapy & Goals - 01/21/22 1401       Intervention Plan   Intervention Prescribe, educate and counsel regarding individualized specific dietary modifications aiming towards targeted core components such as weight, hypertension, lipid management, diabetes, heart failure and other comorbidities.    Expected Outcomes Short Term Goal: Understand basic principles of dietary content, such as calories, fat, sodium, cholesterol and nutrients.;Short Term Goal: A plan has been developed with personal nutrition goals set during dietitian appointment.;Long Term Goal: Adherence to prescribed nutrition plan.             Nutrition Assessments:  MEDIFICTS Score Key: ?70 Need to make dietary changes  40-70 Heart Healthy Diet ? 40 Therapeutic Level Cholesterol Diet  Flowsheet Row Cardiac Rehab from 02/20/2022 in [facility] Cardiac and Pulmonary Rehab  Picture Your Plate Total Score on Discharge 74      Picture Your Plate Scores: <14 Unhealthy dietary pattern with much room for improvement. 41-50 Dietary pattern unlikely to meet recommendations for good health and room for improvement. 51-60 More healthful dietary pattern, with some room for improvement.  >60 Healthy dietary pattern, although there may be some specific behaviors that could be improved.    Nutrition Goals Re-Evaluation:  Nutrition Goals Re-Evaluation     [place] Name 02/05/22 [phone]             Goals   Nutrition Goal Make an appointment with nutritionist       Comment Brett Wilson would still like to meet with dietitian but has not scheduled yet.  Melissa will be out soon but will schedule       Expected Outcome Schedule appointment to meet with dietitian                Nutrition Goals Discharge (Final Nutrition Goals Re-Evaluation):  Nutrition Goals  Re-Evaluation - 02/05/22 0931       Goals   Nutrition Goal Make an appointment with nutritionist    Comment Brett Wilson would still like to meet with dietitian but has not scheduled yet.  Melissa will be out soon but will schedule    Expected Outcome Schedule appointment to meet with dietitian             Psychosocial: Target Goals: Acknowledge presence or absence of significant depression and/or stress, maximize coping skills, provide positive support system. Participant is able to verbalize types and ability to use techniques and skills needed for reducing stress and depression.   Education: Stress, Anxiety, and Depression - Group verbal and visual presentation to define topics covered.  Reviews how body is impacted by stress, anxiety, and depression.  Also discusses healthy ways to reduce stress and to treat/manage anxiety and depression.  Written material given at graduation. Flowsheet Row Cardiac Rehab from 02/13/2022 in [facility] Cardiac and Pulmonary Rehab  Date 02/06/22  Educator [facility]  Instruction Review Code 1- [employer] Understanding       Education: Sleep Hygiene -Provides group verbal and written instruction about how sleep can affect your health.  Define sleep hygiene, discuss sleep cycles and impact of sleep habits. Review good sleep hygiene tips.    Initial Review & Psychosocial Screening:  Initial Psych Review & Screening - 01/04/22 1011       Initial Review   Current issues with None Identified      Family Dynamics   Good Support System? Yes   wife     Barriers   Psychosocial barriers to participate in program There are no identifiable barriers or psychosocial needs.;The patient should benefit from training in stress management and relaxation.      Screening Interventions   Interventions Encouraged to exercise;To provide support and resources with identified psychosocial needs;Provide feedback about the scores to participant    Expected Outcomes Short Term goal:  Utilizing psychosocial counselor, staff and physician to assist with identification of specific Stressors or current issues interfering with healing process. Setting desired goal for each stressor or current issue identified.;Long Term Goal: Stressors or current issues are controlled or eliminated.;Short Term goal: Identification and review with participant of any Quality of Life or Depression concerns found by scoring the questionnaire.;Long Term goal: The participant improves quality of Life and PHQ9 Scores as seen by post scores and/or verbalization  of changes             Quality of Life Scores:   Quality of Life - 02/20/22 1318       Quality of Life   Select Quality of Life      Quality of Life Scores   Health/Function Pre 28.4 %    Health/Function Post 29.83 %    Health/Function % Change 5.04 %    Socioeconomic Pre 26.25 %    Socioeconomic Post 27.14 %    Socioeconomic % Change  3.39 %    Psych/Spiritual Pre 29.14 %    Psych/Spiritual Post 30 %    Psych/Spiritual % Change 2.95 %    Family Pre 30 %    Family Post 30 %    Family % Change 0 %    GLOBAL Pre 28.29 %    GLOBAL Post 29.34 %    GLOBAL % Change 3.71 %            Scores of 19 and below usually indicate a poorer quality of life in these areas.  A difference of  2-3 points is a clinically meaningful difference.  A difference of 2-3 points in the total score of the Quality of Life Index has been associated with significant improvement in overall quality of life, self-image, physical symptoms, and general health in studies assessing change in quality of life.  PHQ-9: Review Flowsheet  More data exists      02/20/2022 02/07/2022 01/21/2022 09/05/2021 03/22/2021  Depression screen PHQ 2/9  Decreased Interest 0 0 0 0 0  Down, Depressed, Hopeless 0 0 0 0 0  PHQ - 2 Score 0 0 0 0 0  Altered sleeping 1 0 0 0 1  Tired, decreased energy 1 0 0 0 0  Change in appetite 0 0 0 0 0  Feeling bad or failure about yourself  0 0 0 0  0  Trouble concentrating 0 0 0 0 0  Moving slowly or fidgety/restless 0 0 0 0 0  Suicidal thoughts 0 0 0 0 0  PHQ-9 Score 2 0 0 0 1  Difficult doing work/chores Not difficult at all Not difficult at all Not difficult at all Not difficult at all Not difficult at all   Interpretation of Total Score  Total Score Depression Severity:  1-4 = Minimal depression, 5-9 = Mild depression, 10-14 = Moderate depression, 15-19 = Moderately severe depression, 20-27 = Severe depression   Psychosocial Evaluation and Intervention:  Psychosocial Evaluation - 01/04/22 1030       Psychosocial Evaluation & Interventions   Interventions Encouraged to exercise with the program and follow exercise prescription    Comments JIm has no barriers to attending the program. He is ready to get started and learn how to manage his heart disease and return to his usual daily activities. He lives with his wife. She is is main support. He has already started losing weight and is working on his nutrition changes.    Expected Outcomes STG Brett Wilson attends all scheduled sessions. He progresses with his exercise and ability to get back to his usual daily activities without concern  LTG Brett Wilson is able to continue his progress after discarge    Continue Psychosocial Services  Follow up required by staff             Psychosocial Re-Evaluation:  Psychosocial Re-Evaluation     [place] Name 02/05/22 [id number]             Psychosocial  Re-Evaluation   Current issues with Current Stress Concerns;Current Sleep Concerns       Comments Brett Wilson is doing well in rehab.  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  He has an appt on 9/7 to see his PCP to further examine.  He is also still dealing with his knee pain too.  This has been a rough year. He continues to lean on his wife for support through all of this. He has not been sleeping well.  He is getting about 4-5 hours a night.  He has a history of insomina in past as  well, but they usually pass.       Expected Outcomes Short: Find out more about thyroid Long: Conitnue to focus on the positive things.       Interventions Encouraged to attend Cardiac Rehabilitation for the exercise;Stress management education       Continue Psychosocial Services  Follow up required by staff                Psychosocial Discharge (Final Psychosocial Re-Evaluation):  Psychosocial Re-Evaluation - 02/05/22 0928       Psychosocial Re-Evaluation   Current issues with Current Stress Concerns;Current Sleep Concerns    Comments Brett Wilson is doing well in rehab.  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  He has an appt on 9/7 to see his PCP to further examine.  He is also still dealing with his knee pain too.  This has been a rough year. He continues to lean on his wife for support through all of this. He has not been sleeping well.  He is getting about 4-5 hours a night.  He has a history of insomina in past as well, but they usually pass.    Expected Outcomes Short: Find out more about thyroid Long: Conitnue to focus on the positive things.    Interventions Encouraged to attend Cardiac Rehabilitation for the exercise;Stress management education    Continue Psychosocial Services  Follow up required by staff             Vocational Rehabilitation: Provide vocational rehab assistance to qualifying candidates.   Vocational Rehab Evaluation & Intervention:   Education: Education Goals: Education classes will be provided on a variety of topics geared toward better understanding of heart health and risk factor modification. Participant will state understanding/return demonstration of topics presented as noted by education test scores.  Learning Barriers/Preferences:   General Cardiac Education Topics:  AED/CPR: - Group verbal and written instruction with the use of models to demonstrate the basic use of the AED with the basic ABC's of  resuscitation.   Anatomy and Cardiac Procedures: - Group verbal and visual presentation and models provide information about basic cardiac anatomy and function. Reviews the testing methods done to diagnose heart disease and the outcomes of the test results. Describes the treatment choices: Medical Management, Angioplasty, or Coronary Bypass Surgery for treating various heart conditions including Myocardial Infarction, Angina, Valve Disease, and Cardiac Arrhythmias.  Written material given at graduation. Flowsheet Row Cardiac Rehab from 02/13/2022 in [facility] Cardiac and Pulmonary Rehab  Education need identified 01/21/22       Medication Safety: - Group verbal and visual instruction to review commonly prescribed medications for heart and lung disease. Reviews the medication, class of the drug, and side effects. Includes the steps to properly store meds and maintain the prescription regimen.  Written material given at graduation.   Intimacy: - Group verbal instruction through game format to discuss how heart and lung disease can affect sexual intimacy. Written material given at graduation..   Know Your Numbers and Heart Failure: - Group verbal and visual instruction to discuss disease risk factors for cardiac and pulmonary disease and treatment options.  Reviews associated critical values for Overweight/Obesity, Hypertension, Cholesterol, and Diabetes.  Discusses basics of heart failure: signs/symptoms and treatments.  Introduces Heart Failure Zone chart for action plan for heart failure.  Written material given at graduation.   Infection Prevention: - Provides verbal and written material to individual with discussion of infection control including proper hand washing and proper equipment cleaning during exercise session. Flowsheet Row Cardiac Rehab from 02/13/2022 in [facility] Cardiac and Pulmonary Rehab  Date 01/21/22  Educator [facility]  Instruction Review Code 1- Verbalizes Understanding        Falls Prevention: - Provides verbal and written material to individual with discussion of falls prevention and safety. Flowsheet Row Cardiac Rehab from 02/13/2022 in [facility] Cardiac and Pulmonary Rehab  Date 01/04/22  Educator SB  Instruction Review Code 1- Verbalizes Understanding       Other: -Provides group and verbal instruction on various topics (see comments)   Knowledge Questionnaire Score:  Knowledge Questionnaire Score - 02/20/22 1313       Knowledge Questionnaire Score   Post Score 26/26             Core Components/Risk Factors/Patient Goals at Admission:  Personal Goals and Risk Factors at Admission - 01/21/22 1402       Core Components/Risk Factors/Patient Goals on Admission    Weight Management Yes;Weight Loss    Intervention Weight Management: Develop a combined nutrition and exercise program designed to reach desired caloric intake, while maintaining appropriate intake of nutrient and fiber, sodium and fats, and appropriate energy expenditure required for the weight goal.;Weight Management: Provide education and appropriate resources to help participant work on and attain dietary goals.;Weight Management/Obesity: Establish reasonable short term and long term weight goals.    Admit Weight 165 lb 12.8 oz (75.2 kg)    Goal Weight: Short Term 163 lb (73.9 kg)    Goal Weight: Long Term 160 lb (72.6 kg)    Expected Outcomes Short Term: Continue to assess and modify interventions until short term weight is achieved;Long Term: Adherence to nutrition and physical activity/exercise program aimed toward attainment of established weight goal;Weight Loss: Understanding of general recommendations for a balanced deficit meal plan, which promotes 1-2 lb weight loss per week and includes a negative energy balance of [phone] kcal/d;Understanding recommendations for meals to include 15-35% energy as protein, 25-35% energy from fat, 35-60% energy from carbohydrates, less than $RemoveB'200mg'pNCBjdaO$   of dietary cholesterol, 20-35 gm of total fiber daily;Understanding of distribution of calorie intake throughout the day with the consumption of 4-5 meals/snacks    Hypertension Yes    Intervention Provide education on lifestyle modifcations including regular physical activity/exercise, weight management, moderate sodium restriction and increased consumption of fresh fruit, vegetables, and low fat dairy, alcohol moderation, and smoking cessation.;Monitor prescription use compliance.    Expected Outcomes Short Term: Continued assessment and intervention until BP is < 140/69mm HG in hypertensive participants. < 130/58mm HG in hypertensive participants with diabetes, heart failure or chronic kidney disease.;Long Term: Maintenance of blood pressure at goal levels.    Lipids Yes    Intervention Provide education and support for participant on nutrition & aerobic/resistive exercise along with prescribed medications  to achieve LDL 70mg , HDL >40mg .    Expected Outcomes Short Term: Participant states understanding of desired cholesterol values and is compliant with medications prescribed. Participant is following exercise prescription and nutrition guidelines.;Long Term: Cholesterol controlled with medications as prescribed, with individualized exercise RX and with personalized nutrition plan. Value goals: LDL < 70mg , HDL > 40 mg.             Education:Diabetes - Individual verbal and written instruction to review signs/symptoms of diabetes, desired ranges of glucose level fasting, after meals and with exercise. Acknowledge that pre and post exercise glucose checks will be done for 3 sessions at entry of program.   Core Components/Risk Factors/Patient Goals Review:   Goals and Risk Factor Review     Row Name 02/05/22 0933             Core Components/Risk Factors/Patient Goals Review   Personal Goals Review Weight Management/Obesity;Hypertension;Lipids       Review 02/07/22 is doing well in rehab.   His weight is staying steady.  His pressures are doing well and he checks them twice a day at home and tracks.  He has not had any medication issues.  He did have his carotids reevaluated last week, they were improved but they found a mass on his thyroid and he will be seeing his PCP to look into this more.       Expected Outcomes Short: Talk to PCP about thyroid Long: continue to monitor risk factors                Core Components/Risk Factors/Patient Goals at Discharge (Final Review):   Goals and Risk Factor Review - 02/05/22 0933       Core Components/Risk Factors/Patient Goals Review   Personal Goals Review Weight Management/Obesity;Hypertension;Lipids    Review 02/07/22 is doing well in rehab.  His weight is staying steady.  His pressures are doing well and he checks them twice a day at home and tracks.  He has not had any medication issues.  He did have his carotids reevaluated last week, they were improved but they found a mass on his thyroid and he will be seeing his PCP to look into this more.    Expected Outcomes Short: Talk to PCP about thyroid Long: continue to monitor risk factors             ITP Comments:  ITP Comments     Row Name 01/04/22 1034 01/21/22 1348 01/23/22 1005 01/29/22 1150 02/20/22 1414   ITP Comments Virtual orientation call completed today. he has an appointment on Date: 01/21/2022  for EP eval and gym Orientation.  Documentation of diagnosis [facility] be found in [facility]  12/26/2021. Completed 02/26/2022 and gym orientation. Initial ITP created and sent for review to Dr. , Medical Director. 30 Day review completed. Medical Director ITP review done, changes made as directed, and signed approval by Medical Director.   NEW First full day of exercise!  Patient was oriented to gym and equipment including functions, settings, policies, and procedures.  Patient's individual exercise prescription and treatment plan were reviewed.  All starting workloads were established based  on the results of the 6 minute walk test done at initial orientation visit.  The plan for exercise progression was also introduced and progression will be customized based on patient's performance and goals. 30 Day review completed. Medical Director ITP review done, changes made as directed, and signed approval by Medical Director.   NEW  Comments:

## 2022-02-21 ENCOUNTER — Ambulatory Visit: Payer: Medicare HMO

## 2022-02-21 ENCOUNTER — Encounter: Payer: Medicare HMO | Admitting: *Deleted

## 2022-02-21 ENCOUNTER — Ambulatory Visit: Payer: Medicare HMO | Admitting: Physician Assistant

## 2022-02-21 DIAGNOSIS — Z955 Presence of coronary angioplasty implant and graft: Secondary | ICD-10-CM

## 2022-02-21 NOTE — Progress Notes (Signed)
Daily Session Note  Patient Details  Name: Brett Wilson MRN: [id number] Date of Birth: Apr 30, 1956 Referring Provider:   Flowsheet Row Cardiac Rehab from 01/21/2022 in [facility] Cardiac and Pulmonary Rehab  Referring Provider Donnelly Angelica MD  [facility] Cardiolgoist: Dr. Sheppard Coil Paraschos]       Encounter Date: 02/21/2022  Check In:  Session Check In - 02/21/22 1027       Check-In   Supervising physician immediately available to respond to emergencies See telemetry face sheet for immediately available ER MD    Location [facility]-Cardiac & Pulmonary Rehab    Staff Present Heath Lark, RN, BSN, CCRP;Joseph Kansas, RCP,RRT,BSRT;Kelly Cuyama, BS, ACSM CEP, Exercise Physiologist    Virtual Visit No    Medication changes reported     No    Fall or balance concerns reported    No    Warm-up and Cool-down Performed on first and last piece of equipment    Resistance Training Performed Yes    VAD Patient? No    PAD/SET Patient? No      Pain Assessment   Currently in Pain? No/denies                Social History   Tobacco Use  Smoking Status Never  Smokeless Tobacco Never    Goals Met:  Independence with exercise equipment Exercise tolerated well Personal goals reviewed No report of concerns or symptoms today  Goals Unmet:  Not Applicable  Comments:  Brett Wilson graduated today from  rehab with 13 sessions completed.  Details of the patient's exercise prescription and what He needs to do in order to continue the prescription and progress were discussed with patient.  Patient was given a copy of prescription and goals.  Patient verbalized understanding.  [name] plans to continue to exercise by exercising at gym and at home. .    Dr. Emily Filbert is Medical Director for [place].  Dr. Ottie Glazier is Medical Director for [facility] Pulmonary Rehabilitation.

## 2022-02-21 NOTE — Progress Notes (Signed)
Discharge Note    Brett Wilson   DOB:[birth year]  Brett Wilson graduated today from  rehab with 13 sessions completed.  Details of the patient's exercise prescription and what He needs to do in order to continue the prescription and progress were discussed with patient.  Patient was given a copy of prescription and goals.  Patient verbalized understanding.  [name] plans to continue to exercise by exercising at gym and at home.   [place] Name 01/21/22 1350 02/20/22 1200       6 Minute Walk   Phase Initial Discharge    Distance 1400 feet 1820 feet    Distance % Change -- 30 %    Distance Feet Change -- 420 ft    Walk Time 6 minutes 6 minutes    # of Rest Breaks 0 0    MPH 2.65 3.44    METS 3.78 4.32    RPE 9 11    VO2 Peak 13.24 15.11    Symptoms No No    Resting HR 81 bpm 72 bpm    Resting BP 152/76 122/64    Resting Oxygen Saturation  99 % --    Exercise Oxygen Saturation  during 6 min walk 98 % --    Max Ex. HR 101 bpm 110 bpm    Max Ex. BP 168/82 130/76    2 Minute Post BP 154/78 118/68            Thank you for the referral.

## 2022-02-21 NOTE — Progress Notes (Signed)
Cardiac Individual Treatment Plan  Patient Details  Name: Brett Wilson MRN: [id number] Date of Birth: 19-Jun-1955 Referring Provider:   Flowsheet Row Cardiac Rehab from 01/21/2022 in [facility] Cardiac and Pulmonary Rehab  Referring Provider Donnelly Angelica MD  [facility] Cardiolgoist: Dr. Sheppard Coil Paraschos]       Initial Encounter Date:  Flowsheet Row Cardiac Rehab from 01/21/2022 in [facility] Cardiac and Pulmonary Rehab  Date 01/21/22       Visit Diagnosis: Status post coronary artery stent placement  Patient's Home Medications on Admission:  Current Outpatient Medications:    aspirin EC 81 MG tablet, Take 81 mg by mouth daily. Swallow whole., Disp: , Rfl:    clopidogrel (PLAVIX) 75 MG tablet, Take 1 tablet (75 mg total) by mouth daily., Disp: 90 tablet, Rfl: 3   cyclobenzaprine (FLEXERIL) 5 MG tablet, Take 1 tablet (5 mg total) by mouth at bedtime., Disp: 30 tablet, Rfl: 2   finasteride (PROSCAR) 5 MG tablet, Take 1 tablet (5 mg total) by mouth at bedtime., Disp: 90 tablet, Rfl: 3   hydrocortisone 2.5 % lotion, Apply topically 3 (three) times a week. Apply to aa face 3 nights weekly, Tuesday, Thursday and Saturday, Disp: 59 mL, Rfl: 6   losartan (COZAAR) 100 MG tablet, TAKE 1 TABLET BY MOUTH EVERY DAY, Disp: 90 tablet, Rfl: 1   metoprolol succinate (TOPROL-XL) 25 MG 24 hr tablet, Take 25 mg by mouth daily., Disp: , Rfl:    Multiple Vitamin (MULTIVITAMIN WITH MINERALS) TABS tablet, Take 1 tablet by mouth daily., Disp: , Rfl:    rosuvastatin (CRESTOR) 40 MG tablet, Take 1 tablet (40 mg total) by mouth daily., Disp: 90 tablet, Rfl: 3   Zinc 50 MG TABS, Take 50 mg by mouth daily., Disp: , Rfl:    zolpidem (AMBIEN) 5 MG tablet, Take 1 tablet (5 mg total) by mouth at bedtime as needed for sleep., Disp: 15 tablet, Rfl: 1  Past Medical History: Past Medical History:  Diagnosis Date   Allergy    Dysplastic nevus 01/17/2020   Left mid side. Severe atypia with scar, close to margin. exc 03/14/2020    Dysplastic nevus 01/17/2020   Left calf. Moderate atypia, deep margin involved.    Dysplastic nevus 01/17/2020   LUQ abdomen. Mild atypia, limited margins free.    Hyperlipidemia    Hypertension    Pituitary adenoma ([place])     Tobacco Use: Social History   Tobacco Use  Smoking Status Never  Smokeless Tobacco Never    Labs: Review Flowsheet       Latest Ref Rng & Units 07/22/2018 09/01/2019 09/01/2020 09/05/2021  Labs for ITP Cardiac and Pulmonary Rehab  Cholestrol 100 - 199 mg/dL 183     179  184  168   LDL (calc) 0 - 99 mg/dL 108     102  119  102   HDL-C >39 mg/dL 43     43  38  45   Trlycerides 0 - 149 mg/dL - 197  148  119   Hemoglobin A1c 4.8 - 5.6 % - - - 5.9     Details       This result is from an external source.          Exercise Target Goals: Exercise Program Goal: Individual exercise prescription set using results from initial 6 min walk test and THRR while considering  patient's activity barriers and safety.   Exercise Prescription Goal: Initial exercise prescription builds to 30-45 minutes a day of aerobic  activity, 2-3 days per week.  Home exercise guidelines will be given to patient during program as part of exercise prescription that the participant will acknowledge.   Education: Aerobic Exercise: - Group verbal and visual presentation on the components of exercise prescription. Introduces F.I.T.T principle from ACSM for exercise prescriptions.  Reviews F.I.T.T. principles of aerobic exercise including progression. Written material given at graduation. Flowsheet Row Cardiac Rehab from 02/13/2022 in [facility] Cardiac and Pulmonary Rehab  Education need identified 01/21/22       Education: Resistance Exercise: - Group verbal and visual presentation on the components of exercise prescription. Introduces F.I.T.T principle from ACSM for exercise prescriptions  Reviews F.I.T.T. principles of resistance exercise including progression. Written material given at  graduation.    Education: Exercise & Equipment Safety: - Individual verbal instruction and demonstration of equipment use and safety with use of the equipment. Flowsheet Row Cardiac Rehab from 02/13/2022 in [facility] Cardiac and Pulmonary Rehab  Date 01/21/22  Educator [facility]  Instruction Review Code 1- Verbalizes Understanding       Education: Exercise Physiology & General Exercise Guidelines: - Group verbal and written instruction with models to review the exercise physiology of the cardiovascular system and associated critical values. Provides general exercise guidelines with specific guidelines to those with heart or lung disease.  Flowsheet Row Cardiac Rehab from 02/13/2022 in [facility] Cardiac and Pulmonary Rehab  Date 02/13/22  Educator [facility]  Instruction Review Code 1- Verbalizes Understanding       Education: Flexibility, Balance, Mind/Body Relaxation: - Group verbal and visual presentation with interactive activity on the components of exercise prescription. Introduces F.I.T.T principle from ACSM for exercise prescriptions. Reviews F.I.T.T. principles of flexibility and balance exercise training including progression. Also discusses the mind body connection.  Reviews various relaxation techniques to help reduce and manage stress (i.e. Deep breathing, progressive muscle relaxation, and visualization). Balance handout provided to take home. Written material given at graduation.   Activity Barriers & Risk Stratification:  Activity Barriers & Cardiac Risk Stratification - 01/21/22 1351       Activity Barriers & Cardiac Risk Stratification   Activity Barriers Other (comment);Balance Concerns;Muscular Weakness;Neck/Spine Problems    Comments partially torn L Medial menincus, buldging disc    Cardiac Risk Stratification Moderate             6 Minute Walk:  6 Minute Walk     Row Name 01/21/22 1350 02/20/22 1200       6 Minute Walk   Phase Initial Discharge    Distance 1400 feet 1820  feet    Distance % Change -- 30 %    Distance Feet Change -- 420 ft    Walk Time 6 minutes 6 minutes    # of Rest Breaks 0 0    MPH 2.65 3.44    METS 3.78 4.32    RPE 9 11    VO2 Peak 13.24 15.11    Symptoms No No    Resting HR 81 bpm 72 bpm    Resting BP 152/76 122/64    Resting Oxygen Saturation  99 % --    Exercise Oxygen Saturation  during 6 min walk 98 % --    Max Ex. HR 101 bpm 110 bpm    Max Ex. BP 168/82 130/76    2 Minute Post BP 154/78 118/68             Oxygen Initial Assessment:   Oxygen Re-Evaluation:   Oxygen Discharge (Final Oxygen Re-Evaluation):  Initial Exercise Prescription:  Initial Exercise Prescription - 01/21/22 1300       Date of Initial Exercise RX and Referring Provider   Date 01/21/22    Referring Provider Donnelly Angelica MD   New Cardiolgoist: Dr. Isaias Cowman     Oxygen   Maintain Oxygen Saturation 88% or higher      Treadmill   MPH 2.6    Grade 2    Minutes 15    METs 3.71      Elliptical   Level 1    Speed 3.6    Minutes 15    METs 3.7      REL-XR   Level 3    Speed 50    Minutes 15    METs 3.7      Prescription Details   Frequency (times per week) 3    Duration Progress to 30 minutes of continuous aerobic without signs/symptoms of physical distress      Intensity   THRR 40-80% of Max Heartrate 110-139    Ratings of Perceived Exertion 11-13    Perceived Dyspnea 0-4      Progression   Progression Continue to progress workloads to maintain intensity without signs/symptoms of physical distress.      Resistance Training   Training Prescription Yes    Weight 5 lb    Reps 10-15             Perform Capillary Blood Glucose checks as needed.  Exercise Prescription Changes:   Exercise Prescription Changes     Row Name 01/21/22 1300 02/05/22 0900 02/12/22 1500         Response to Exercise   Blood Pressure (Admit) 152/76 -- 130/72     Blood Pressure (Exercise) 168/82 -- 146/78     Blood Pressure  (Exit) 154/78 -- 102/64     Heart Rate (Admit) 81 bpm -- 91 bpm     Heart Rate (Exercise) 101 bpm -- 136 bpm     Heart Rate (Exit) 84 bpm -- 75 bpm     Oxygen Saturation (Admit) 99 % -- --     Oxygen Saturation (Exercise) 98 % -- --     Rating of Perceived Exertion (Exercise) 9 -- 11     Symptoms none -- none     Comments walk test results -- 4th full day of exercise     Duration -- -- Continue with 30 min of aerobic exercise without signs/symptoms of physical distress.     Intensity -- -- THRR unchanged       Progression   Progression -- -- Continue to progress workloads to maintain intensity without signs/symptoms of physical distress.     Average METs -- -- 3.76       Resistance Training   Training Prescription -- -- Yes     Weight -- -- 6 lb     Reps -- -- 10-15       Interval Training   Interval Training -- -- No       Treadmill   MPH -- -- 3.2     Grade -- -- 0.5     Minutes -- -- 15     METs -- -- 3.67       Elliptical   Level -- -- 2.9     Speed -- -- 3.6     Minutes -- -- 15       REL-XR   Level -- -- 4     Minutes -- -- 15  METs -- -- 3.6       Home Exercise Plan   Plans to continue exercise at -- Home (comment)  walking, biking, weights, bands Home (comment)  walking, biking, weights, bands     Frequency -- Add 2 additional days to program exercise sessions. Add 2 additional days to program exercise sessions.     Initial Home Exercises Provided -- 02/05/22 02/05/22       Oxygen   Maintain Oxygen Saturation -- -- 88% or higher              Exercise Comments:   Exercise Comments     Row Name 01/29/22 1150 02/21/22 1029         Exercise Comments First full day of exercise!  Patient was oriented to gym and equipment including functions, settings, policies, and procedures.  Patient's individual exercise prescription and treatment plan were reviewed.  All starting workloads were established based on the results of the 6 minute walk test done at  initial orientation visit.  The plan for exercise progression was also introduced and progression will be customized based on patient's performance and goals. Tanya graduated today from  rehab with 13 sessions completed.  Details of the patient's exercise prescription and what He needs to do in order to continue the prescription and progress were discussed with patient.  Patient was given a copy of prescription and goals.  Patient verbalized understanding.  Hulon plans to continue to exercise by exercising at gym and at home. .               Exercise Goals and Review:   Exercise Goals     Row Name 01/21/22 1400             Exercise Goals   Increase Physical Activity Yes       Intervention Provide advice, education, support and counseling about physical activity/exercise needs.;Develop an individualized exercise prescription for aerobic and resistive training based on initial evaluation findings, risk stratification, comorbidities and participant's personal goals.       Expected Outcomes Short Term: Attend rehab on a regular basis to increase amount of physical activity.;Long Term: Add in home exercise to make exercise part of routine and to increase amount of physical activity.;Long Term: Exercising regularly at least 3-5 days a week.       Increase Strength and Stamina Yes       Intervention Provide advice, education, support and counseling about physical activity/exercise needs.;Develop an individualized exercise prescription for aerobic and resistive training based on initial evaluation findings, risk stratification, comorbidities and participant's personal goals.       Expected Outcomes Short Term: Increase workloads from initial exercise prescription for resistance, speed, and METs.;Short Term: Perform resistance training exercises routinely during rehab and add in resistance training at home;Long Term: Improve cardiorespiratory fitness, muscular endurance and strength as measured by  increased METs and functional capacity (6MWT)       Able to understand and use rate of perceived exertion (RPE) scale Yes       Intervention Provide education and explanation on how to use RPE scale       Expected Outcomes Short Term: Able to use RPE daily in rehab to express subjective intensity level;Long Term:  Able to use RPE to guide intensity level when exercising independently       Able to understand and use Dyspnea scale Yes       Intervention Provide education and explanation on how to use Dyspnea scale  Expected Outcomes Short Term: Able to use Dyspnea scale daily in rehab to express subjective sense of shortness of breath during exertion;Long Term: Able to use Dyspnea scale to guide intensity level when exercising independently       Knowledge and understanding of Target Heart Rate Range (THRR) Yes       Intervention Provide education and explanation of THRR including how the numbers were predicted and where they are located for reference       Expected Outcomes Short Term: Able to state/look up THRR;Short Term: Able to use daily as guideline for intensity in rehab;Long Term: Able to use THRR to govern intensity when exercising independently       Able to check pulse independently Yes       Intervention Provide education and demonstration on how to check pulse in carotid and radial arteries.;Review the importance of being able to check your own pulse for safety during independent exercise       Expected Outcomes Short Term: Able to explain why pulse checking is important during independent exercise;Long Term: Able to check pulse independently and accurately       Understanding of Exercise Prescription Yes       Intervention Provide education, explanation, and written materials on patient's individual exercise prescription       Expected Outcomes Long Term: Able to explain home exercise prescription to exercise independently;Short Term: Able to explain program exercise prescription                 Exercise Goals Re-Evaluation :  Exercise Goals Re-Evaluation     [place] Name 01/29/22 1149 02/05/22 0927 02/12/22 1513         Exercise Goal Re-Evaluation   Exercise Goals Review Able to understand and use rate of perceived exertion (RPE) scale;Knowledge and understanding of Target Heart Rate Range (THRR);Understanding of Exercise Prescription;Able to understand and use Dyspnea scale Increase Physical Activity;Increase Strength and Stamina;Able to understand and use rate of perceived exertion (RPE) scale;Able to understand and use Dyspnea scale;Knowledge and understanding of Target Heart Rate Range (THRR);Able to check pulse independently;Understanding of Exercise Prescription Increase Physical Activity;Increase Strength and Stamina;Understanding of Exercise Prescription     Comments Reviewed RPE and dyspnea scales, THR and program prescription with pt today.  Pt voiced understanding and was given a copy of goals to take home. Clair Gulling is off to a good start in rehab.  He has already started to notice a change in his stamina.  Reviewed home exercise with pt today.  Pt plans to walk and use bikes at home for exercise.  He also has weights and bands to use.  Reviewed THR, pulse, RPE, sign and symptoms, pulse oximetery and when to call 911 or MD.  Also discussed weather considerations and indoor options.  Pt voiced understanding. Clair Gulling continues to do well in rehab. He has already been able to increase to using 6 lbs for handweights. He has also tried the elliptical and was able to tolerate it well. He bumped up his speed on the treadmill to  to 3.2 speed with a small incline. We will continue to monitor.     Expected Outcomes Short: Use RPE daily to regulate intensity. Long: Follow program prescription in THR. Short: Start to add in more exercise at home Long: Conitnue to improve stamina Short: Continue to work up load on the elliptical, watch HR Long: Continue to increase overall MET level  Discharge Exercise Prescription (Final Exercise Prescription Changes):  Exercise Prescription Changes - 02/12/22 1500       Response to Exercise   Blood Pressure (Admit) 130/72    Blood Pressure (Exercise) 146/78    Blood Pressure (Exit) 102/64    Heart Rate (Admit) 91 bpm    Heart Rate (Exercise) 136 bpm    Heart Rate (Exit) 75 bpm    Rating of Perceived Exertion (Exercise) 11    Symptoms none    Comments 4th full day of exercise    Duration Continue with 30 min of aerobic exercise without signs/symptoms of physical distress.    Intensity THRR unchanged      Progression   Progression Continue to progress workloads to maintain intensity without signs/symptoms of physical distress.    Average METs 3.76      Resistance Training   Training Prescription Yes    Weight 6 lb    Reps 10-15      Interval Training   Interval Training No      Treadmill   MPH 3.2    Grade 0.5    Minutes 15    METs 3.67      Elliptical   Level 2.9    Speed 3.6    Minutes 15      REL-XR   Level 4    Minutes 15    METs 3.6      Home Exercise Plan   Plans to continue exercise at Home (comment)   walking, biking, weights, bands   Frequency Add 2 additional days to program exercise sessions.    Initial Home Exercises Provided 02/05/22      Oxygen   Maintain Oxygen Saturation 88% or higher             Nutrition:  Target Goals: Understanding of nutrition guidelines, daily intake of sodium <1543m, cholesterol <2064m calories 30% from fat and 7% or less from saturated fats, daily to have 5 or more servings of fruits and vegetables.  Education: All About Nutrition: -Group instruction provided by verbal, written material, interactive activities, discussions, models, and posters to present general guidelines for heart healthy nutrition including fat, fiber, MyPlate, the role of sodium in heart healthy nutrition, utilization of the nutrition label, and utilization of this  knowledge for meal planning. Follow up email sent as well. Written material given at graduation.   Biometrics:  Pre Biometrics - 01/21/22 1400       Pre Biometrics   Height 5' 8.8" (1.748 m)    Weight 169 lb 12.8 oz (77 kg)    BMI (Calculated) 25.21    Single Leg Stand 15 seconds             Post Biometrics - 02/20/22 1200        Post  Biometrics   Height 5' 8.8" (1.748 m)    Weight 165 lb 9.6 oz (75.1 kg)    BMI (Calculated) 24.58    Single Leg Stand 10.1 seconds             Nutrition Therapy Plan and Nutrition Goals:  Nutrition Therapy & Goals - 01/21/22 1401       Intervention Plan   Intervention Prescribe, educate and counsel regarding individualized specific dietary modifications aiming towards targeted core components such as weight, hypertension, lipid management, diabetes, heart failure and other comorbidities.    Expected Outcomes Short Term Goal: Understand basic principles of dietary content, such as calories, fat, sodium, cholesterol and nutrients.;Short Term Goal:  A plan has been developed with personal nutrition goals set during dietitian appointment.;Long Term Goal: Adherence to prescribed nutrition plan.             Nutrition Assessments:  MEDIFICTS Score Key: ?70 Need to make dietary changes  40-70 Heart Healthy Diet ? 40 Therapeutic Level Cholesterol Diet  Flowsheet Row Cardiac Rehab from 02/20/2022 in [facility] Cardiac and Pulmonary Rehab  Picture Your Plate Total Score on Discharge 74      Picture Your Plate Scores: <94 Unhealthy dietary pattern with much room for improvement. 41-50 Dietary pattern unlikely to meet recommendations for good health and room for improvement. 51-60 More healthful dietary pattern, with some room for improvement.  >60 Healthy dietary pattern, although there may be some specific behaviors that could be improved.    Nutrition Goals Re-Evaluation:  Nutrition Goals Re-Evaluation     [place] Name 02/05/22 [phone]              Goals   Nutrition Goal Make an appointment with nutritionist       Comment Clair Gulling would still like to meet with dietitian but has not scheduled yet.  Melissa will be out soon but will schedule       Expected Outcome Schedule appointment to meet with dietitian                Nutrition Goals Discharge (Final Nutrition Goals Re-Evaluation):  Nutrition Goals Re-Evaluation - 02/05/22 0931       Goals   Nutrition Goal Make an appointment with nutritionist    Comment Clair Gulling would still like to meet with dietitian but has not scheduled yet.  Melissa will be out soon but will schedule    Expected Outcome Schedule appointment to meet with dietitian             Psychosocial: Target Goals: Acknowledge presence or absence of significant depression and/or stress, maximize coping skills, provide positive support system. Participant is able to verbalize types and ability to use techniques and skills needed for reducing stress and depression.   Education: Stress, Anxiety, and Depression - Group verbal and visual presentation to define topics covered.  Reviews how body is impacted by stress, anxiety, and depression.  Also discusses healthy ways to reduce stress and to treat/manage anxiety and depression.  Written material given at graduation. Flowsheet Row Cardiac Rehab from 02/13/2022 in [facility] Cardiac and Pulmonary Rehab  Date 02/06/22  Educator [facility]  Instruction Review Code 1- [employer] Understanding       Education: Sleep Hygiene -Provides group verbal and written instruction about how sleep can affect your health.  Define sleep hygiene, discuss sleep cycles and impact of sleep habits. Review good sleep hygiene tips.    Initial Review & Psychosocial Screening:  Initial Psych Review & Screening - 01/04/22 1011       Initial Review   Current issues with None Identified      Family Dynamics   Good Support System? Yes   wife     Barriers   Psychosocial barriers to participate in  program There are no identifiable barriers or psychosocial needs.;The patient should benefit from training in stress management and relaxation.      Screening Interventions   Interventions Encouraged to exercise;To provide support and resources with identified psychosocial needs;Provide feedback about the scores to participant    Expected Outcomes Short Term goal: Utilizing psychosocial counselor, staff and physician to assist with identification of specific Stressors or current issues interfering with healing process. Setting desired  goal for each stressor or current issue identified.;Long Term Goal: Stressors or current issues are controlled or eliminated.;Short Term goal: Identification and review with participant of any Quality of Life or Depression concerns found by scoring the questionnaire.;Long Term goal: The participant improves quality of Life and PHQ9 Scores as seen by post scores and/or verbalization of changes             Quality of Life Scores:   Quality of Life - 02/20/22 1318       Quality of Life   Select Quality of Life      Quality of Life Scores   Health/Function Pre 28.4 %    Health/Function Post 29.83 %    Health/Function % Change 5.04 %    Socioeconomic Pre 26.25 %    Socioeconomic Post 27.14 %    Socioeconomic % Change  3.39 %    Psych/Spiritual Pre 29.14 %    Psych/Spiritual Post 30 %    Psych/Spiritual % Change 2.95 %    Family Pre 30 %    Family Post 30 %    Family % Change 0 %    GLOBAL Pre 28.29 %    GLOBAL Post 29.34 %    GLOBAL % Change 3.71 %            Scores of 19 and below usually indicate a poorer quality of life in these areas.  A difference of  2-3 points is a clinically meaningful difference.  A difference of 2-3 points in the total score of the Quality of Life Index has been associated with significant improvement in overall quality of life, self-image, physical symptoms, and general health in studies assessing change in quality of  life.  PHQ-9: Review Flowsheet  More data exists      02/20/2022 02/07/2022 01/21/2022 09/05/2021 03/22/2021  Depression screen PHQ 2/9  Decreased Interest 0 0 0 0 0  Down, Depressed, Hopeless 0 0 0 0 0  PHQ - 2 Score 0 0 0 0 0  Altered sleeping 1 0 0 0 1  Tired, decreased energy 1 0 0 0 0  Change in appetite 0 0 0 0 0  Feeling bad or failure about yourself  0 0 0 0 0  Trouble concentrating 0 0 0 0 0  Moving slowly or fidgety/restless 0 0 0 0 0  Suicidal thoughts 0 0 0 0 0  PHQ-9 Score 2 0 0 0 1  Difficult doing work/chores Not difficult at all Not difficult at all Not difficult at all Not difficult at all Not difficult at all   Interpretation of Total Score  Total Score Depression Severity:  1-4 = Minimal depression, 5-9 = Mild depression, 10-14 = Moderate depression, 15-19 = Moderately severe depression, 20-27 = Severe depression   Psychosocial Evaluation and Intervention:  Psychosocial Evaluation - 01/04/22 1030       Psychosocial Evaluation & Interventions   Interventions Encouraged to exercise with the program and follow exercise prescription    Comments JIm has no barriers to attending the program. He is ready to get started and learn how to manage his heart disease and return to his usual daily activities. He lives with his wife. She is is main support. He has already started losing weight and is working on his nutrition changes.    Expected Outcomes STG Clair Gulling attends all scheduled sessions. He progresses with his exercise and ability to get back to his usual daily activities without concern  LTG Clair Gulling is able  to continue his progress after discarge    Continue Psychosocial Services  Follow up required by staff             Psychosocial Re-Evaluation:  Psychosocial Re-Evaluation     [place] Name 02/05/22 [phone]             Psychosocial Re-Evaluation   Current issues with Current Stress Concerns;Current Sleep Concerns       Comments Clair Gulling is doing well in rehab.  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  He has an appt on 9/7 to see his PCP to further examine.  He is also still dealing with his knee pain too.  This has been a rough year. He continues to lean on his wife for support through all of this. He has not been sleeping well.  He is getting about 4-5 hours a night.  He has a history of insomina in past as well, but they usually pass.       Expected Outcomes Short: Find out more about thyroid Long: Conitnue to focus on the positive things.       Interventions Encouraged to attend Cardiac Rehabilitation for the exercise;Stress management education       Continue Psychosocial Services  Follow up required by staff                Psychosocial Discharge (Final Psychosocial Re-Evaluation):  Psychosocial Re-Evaluation - 02/05/22 0928       Psychosocial Re-Evaluation   Current issues with Current Stress Concerns;Current Sleep Concerns    Comments Clair Gulling is doing well in rehab.  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  He has an appt on 9/7 to see his PCP to further examine.  He is also still dealing with his knee pain too.  This has been a rough year. He continues to lean on his wife for support through all of this. He has not been sleeping well.  He is getting about 4-5 hours a night.  He has a history of insomina in past as well, but they usually pass.    Expected Outcomes Short: Find out more about thyroid Long: Conitnue to focus on the positive things.    Interventions Encouraged to attend Cardiac Rehabilitation for the exercise;Stress management education    Continue Psychosocial Services  Follow up required by staff             Vocational Rehabilitation: Provide vocational rehab assistance to qualifying candidates.   Vocational Rehab Evaluation & Intervention:   Education: Education Goals: Education classes will be provided on a variety of topics geared toward better  understanding of heart health and risk factor modification. Participant will state understanding/return demonstration of topics presented as noted by education test scores.  Learning Barriers/Preferences:   General Cardiac Education Topics:  AED/CPR: - Group verbal and written instruction with the use of models to demonstrate the basic use of the AED with the basic ABC's of resuscitation.   Anatomy and Cardiac Procedures: - Group verbal and visual presentation and models provide information about basic cardiac anatomy and function. Reviews the testing methods done to diagnose heart disease and the outcomes of the test results. Describes the treatment choices: Medical Management, Angioplasty, or Coronary Bypass Surgery for treating various heart conditions including Myocardial Infarction, Angina, Valve Disease, and Cardiac Arrhythmias.  Written material given at graduation. Flowsheet Row Cardiac Rehab from 02/13/2022 in [facility]  Cardiac and Pulmonary Rehab  Education need identified 01/21/22       Medication Safety: - Group verbal and visual instruction to review commonly prescribed medications for heart and lung disease. Reviews the medication, class of the drug, and side effects. Includes the steps to properly store meds and maintain the prescription regimen.  Written material given at graduation.   Intimacy: - Group verbal instruction through game format to discuss how heart and lung disease can affect sexual intimacy. Written material given at graduation..   Know Your Numbers and Heart Failure: - Group verbal and visual instruction to discuss disease risk factors for cardiac and pulmonary disease and treatment options.  Reviews associated critical values for Overweight/Obesity, Hypertension, Cholesterol, and Diabetes.  Discusses basics of heart failure: signs/symptoms and treatments.  Introduces Heart Failure Zone chart for action plan for heart failure.  Written material given at  graduation.   Infection Prevention: - Provides verbal and written material to individual with discussion of infection control including proper hand washing and proper equipment cleaning during exercise session. Flowsheet Row Cardiac Rehab from 02/13/2022 in [facility] Cardiac and Pulmonary Rehab  Date 01/21/22  Educator [facility]  Instruction Review Code 1- Verbalizes Understanding       Falls Prevention: - Provides verbal and written material to individual with discussion of falls prevention and safety. Flowsheet Row Cardiac Rehab from 02/13/2022 in [facility] Cardiac and Pulmonary Rehab  Date 01/04/22  Educator SB  Instruction Review Code 1- Verbalizes Understanding       Other: -Provides group and verbal instruction on various topics (see comments)   Knowledge Questionnaire Score:  Knowledge Questionnaire Score - 02/20/22 1313       Knowledge Questionnaire Score   Post Score 26/26             Core Components/Risk Factors/Patient Goals at Admission:  Personal Goals and Risk Factors at Admission - 01/21/22 1402       Core Components/Risk Factors/Patient Goals on Admission    Weight Management Yes;Weight Loss    Intervention Weight Management: Develop a combined nutrition and exercise program designed to reach desired caloric intake, while maintaining appropriate intake of nutrient and fiber, sodium and fats, and appropriate energy expenditure required for the weight goal.;Weight Management: Provide education and appropriate resources to help participant work on and attain dietary goals.;Weight Management/Obesity: Establish reasonable short term and long term weight goals.    Admit Weight 165 lb 12.8 oz (75.2 kg)    Goal Weight: Short Term 163 lb (73.9 kg)    Goal Weight: Long Term 160 lb (72.6 kg)    Expected Outcomes Short Term: Continue to assess and modify interventions until short term weight is achieved;Long Term: Adherence to nutrition and physical activity/exercise program aimed  toward attainment of established weight goal;Weight Loss: Understanding of general recommendations for a balanced deficit meal plan, which promotes 1-2 lb weight loss per week and includes a negative energy balance of [phone] kcal/d;Understanding recommendations for meals to include 15-35% energy as protein, 25-35% energy from fat, 35-60% energy from carbohydrates, less than 226m of dietary cholesterol, 20-35 gm of total fiber daily;Understanding of distribution of calorie intake throughout the day with the consumption of 4-5 meals/snacks    Hypertension Yes    Intervention Provide education on lifestyle modifcations including regular physical activity/exercise, weight management, moderate sodium restriction and increased consumption of fresh fruit, vegetables, and low fat dairy, alcohol moderation, and smoking cessation.;Monitor prescription use compliance.    Expected Outcomes Short Term: Continued assessment and  intervention until BP is < 140/64mm HG in hypertensive participants. < 130/44mm HG in hypertensive participants with diabetes, heart failure or chronic kidney disease.;Long Term: Maintenance of blood pressure at goal levels.    Lipids Yes    Intervention Provide education and support for participant on nutrition & aerobic/resistive exercise along with prescribed medications to achieve LDL '70mg'$ , HDL >$Remo'40mg'xYJOw$ .    Expected Outcomes Short Term: Participant states understanding of desired cholesterol values and is compliant with medications prescribed. Participant is following exercise prescription and nutrition guidelines.;Long Term: Cholesterol controlled with medications as prescribed, with individualized exercise RX and with personalized nutrition plan. Value goals: LDL < $Rem'70mg'lBAV$ , HDL > 40 mg.             Education:Diabetes - Individual verbal and written instruction to review signs/symptoms of diabetes, desired ranges of glucose level fasting, after meals and with exercise. Acknowledge that  pre and post exercise glucose checks will be done for 3 sessions at entry of program.   Core Components/Risk Factors/Patient Goals Review:   Goals and Risk Factor Review     Row Name 02/05/22 0933             Core Components/Risk Factors/Patient Goals Review   Personal Goals Review Weight Management/Obesity;Hypertension;Lipids       Review Clair Gulling is doing well in rehab.  His weight is staying steady.  His pressures are doing well and he checks them twice a day at home and tracks.  He has not had any medication issues.  He did have his carotids reevaluated last week, they were improved but they found a mass on his thyroid and he will be seeing his PCP to look into this more.       Expected Outcomes Short: Talk to PCP about thyroid Long: continue to monitor risk factors                Core Components/Risk Factors/Patient Goals at Discharge (Final Review):   Goals and Risk Factor Review - 02/05/22 0933       Core Components/Risk Factors/Patient Goals Review   Personal Goals Review Weight Management/Obesity;Hypertension;Lipids    Review Clair Gulling is doing well in rehab.  His weight is staying steady.  His pressures are doing well and he checks them twice a day at home and tracks.  He has not had any medication issues.  He did have his carotids reevaluated last week, they were improved but they found a mass on his thyroid and he will be seeing his PCP to look into this more.    Expected Outcomes Short: Talk to PCP about thyroid Long: continue to monitor risk factors             ITP Comments:  ITP Comments     Row Name 01/04/22 1034 01/21/22 1348 01/23/22 1005 01/29/22 1150 02/20/22 1414   ITP Comments Virtual orientation call completed today. he has an appointment on Date: 01/21/2022  for EP eval and gym Orientation.  Documentation of diagnosis can be found in [facility]  12/26/2021. Completed 6MWT and gym orientation. Initial ITP created and sent for review to Dr. Emily Filbert, Medical Director.  30 Day review completed. Medical Director ITP review done, changes made as directed, and signed approval by Medical Director.   NEW First full day of exercise!  Patient was oriented to gym and equipment including functions, settings, policies, and procedures.  Patient's individual exercise prescription and treatment plan were reviewed.  All starting workloads were established based on the results of  the 6 minute walk test done at initial orientation visit.  The plan for exercise progression was also introduced and progression will be customized based on patient's performance and goals. 30 Day review completed. Medical Director ITP review done, changes made as directed, and signed approval by Medical Director.   NEW    Row Name 02/21/22 1029           ITP Comments Dhillon graduated today from  rehab with 13 sessions completed.  Details of the patient's exercise prescription and what He needs to do in order to continue the prescription and progress were discussed with patient.  Patient was given a copy of prescription and goals.  Patient verbalized understanding.  Aulden plans to continue to exercise by exercising at gym and at home. .                Comments: Discharge

## 2022-02-22 ENCOUNTER — Ambulatory Visit
Admission: RE | Admit: 2022-02-22 | Discharge: 2022-02-22 | Disposition: A | Discharge status: Home / Self Care | Payer: Medicare HMO | Source: Ambulatory Visit | Attending: Physician Assistant | Admitting: Physician Assistant

## 2022-02-22 DIAGNOSIS — K7689 Other specified diseases of liver: Secondary | ICD-10-CM | POA: Insufficient documentation

## 2022-02-22 DIAGNOSIS — E041 Nontoxic single thyroid nodule: Secondary | ICD-10-CM | POA: Insufficient documentation

## 2022-03-04 ENCOUNTER — Ambulatory Visit: Payer: Medicare HMO

## 2022-03-08 ENCOUNTER — Ambulatory Visit: Payer: Medicare HMO | Admitting: Physician Assistant

## 2022-03-12 ENCOUNTER — Ambulatory Visit
Admission: RE | Admit: 2022-03-12 | Discharge: 2022-03-12 | Disposition: A | Discharge status: Home / Self Care | Payer: Medicare HMO | Source: Ambulatory Visit | Attending: Ophthalmology | Admitting: Ophthalmology

## 2022-03-12 DIAGNOSIS — H539 Unspecified visual disturbance: Secondary | ICD-10-CM | POA: Insufficient documentation

## 2022-03-12 DIAGNOSIS — D352 Benign neoplasm of pituitary gland: Secondary | ICD-10-CM | POA: Diagnosis present

## 2022-03-12 MED ORDER — GADOPICLENOL 0.5 MMOL/ML IV SOLN
7.5000 mL | Freq: Once | INTRAVENOUS | Status: AC | PRN
Start: 2022-03-12 — End: 2022-03-12
  Administered 2022-03-12: 7.5 mL via INTRAVENOUS

## 2022-03-15 ENCOUNTER — Ambulatory Visit: Payer: Medicare HMO | Admitting: Physician Assistant

## 2022-03-21 ENCOUNTER — Ambulatory Visit (INDEPENDENT_AMBULATORY_CARE_PROVIDER_SITE_OTHER): Payer: Medicare HMO | Admitting: Physician Assistant

## 2022-03-21 ENCOUNTER — Encounter: Payer: Self-pay | Admitting: Physician Assistant

## 2022-03-21 VITALS — BP 117/68 | HR 62 | Ht 67.0 in | Wt 166.4 lb

## 2022-03-21 DIAGNOSIS — R7303 Prediabetes: Secondary | ICD-10-CM

## 2022-03-21 NOTE — Progress Notes (Signed)
I,Sha'taria Tyson,acting as a Education administrator for [employer], [name].,have documented all relevant documentation on the behalf of Brett Kirschner, [name],as directed by  Brett Kirschner, [name] while in the presence of Brett Kirschner, [name].   Established patient visit   Patient: Brett Wilson   DOB: December 06, 1955   66 y.o. Male  MRN: [id number] Visit Date: 03/21/2022  Today's healthcare provider: [name], [name]   Cc. Pre dm f/u  Subjective    HPI  Prediabetes, Follow-up  Lab Results  Component Value Date   HGBA1C 5.9 (H) 09/05/2021   GLUCOSE 99 09/05/2021   GLUCOSE 96 09/01/2020   GLUCOSE 149 (H) 11/29/2019    Last seen for for this6 months ago.  Management since that visit includes monitor diet. Current symptoms include none and have been stable.  Prior visit with dietician: no Current diet: well balanced Current exercise: bicycling and treadmill  Pertinent Labs:    Component Value Date/Time   CHOL 168 09/05/2021 1016   TRIG 119 09/05/2021 1016   CHOLHDL 3.7 09/05/2021 1016   CREATININE 1.00 12/17/2021 1343    Wt Readings from Last 3 Encounters:  03/21/22 166 lb 6.4 oz (75.5 kg)  02/20/22 165 lb 9.6 oz (75.1 kg)  02/07/22 166 lb 12.8 oz (75.7 kg)    -----------------------------------------------------------------------------------------   Medications: Outpatient Medications Prior to Visit  Medication Sig   aspirin EC 81 MG tablet Take 81 mg by mouth daily. Swallow whole.   clopidogrel (PLAVIX) 75 MG tablet Take 1 tablet (75 mg total) by mouth daily.   cyclobenzaprine (FLEXERIL) 5 MG tablet Take 1 tablet (5 mg total) by mouth at bedtime.   finasteride (PROSCAR) 5 MG tablet Take 1 tablet (5 mg total) by mouth at bedtime.   hydrocortisone 2.5 % lotion Apply topically 3 (three) times a week. Apply to aa face 3 nights weekly, Tuesday, Thursday and Saturday   losartan (COZAAR) 100 MG tablet TAKE 1 TABLET BY MOUTH EVERY DAY   metoprolol succinate (TOPROL-XL) 25 MG 24  hr tablet Take 25 mg by mouth daily.   Multiple Vitamin (MULTIVITAMIN WITH MINERALS) TABS tablet Take 1 tablet by mouth daily.   rosuvastatin (CRESTOR) 40 MG tablet Take 1 tablet (40 mg total) by mouth daily.   Zinc 50 MG TABS Take 50 mg by mouth daily.   zolpidem (AMBIEN) 5 MG tablet Take 1 tablet (5 mg total) by mouth at bedtime as needed for sleep.   No facility-administered medications prior to visit.    Review of Systems  Constitutional:  Negative for fatigue and fever.  Respiratory:  Negative for cough and shortness of breath.   Cardiovascular:  Negative for chest pain and leg swelling.      Objective    Blood pressure 117/68, pulse 62, height '5\' 7"'$  (1.702 m), weight 166 lb 6.4 oz (75.5 kg), SpO2 100 %.   Physical Exam Constitutional:      General: He is awake.     Appearance: He is well-developed.  HENT:     Head: Normocephalic.  Eyes:     Conjunctiva/sclera: Conjunctivae normal.  Cardiovascular:     Rate and Rhythm: Normal rate and regular rhythm.     Heart sounds: Normal heart sounds.  Pulmonary:     Effort: Pulmonary effort is normal.     Breath sounds: Normal breath sounds.  Skin:    General: Skin is warm.  Neurological:     Mental Status: He is alert and oriented to person, place, and time.  Psychiatric:        Attention and Perception: Attention normal.        Mood and Affect: Mood normal.        Speech: Speech normal.        Behavior: Behavior is cooperative.     No results found for any visits on 03/21/22.  Assessment & Plan     Problem List Items Addressed This Visit       Other   Prediabetes - Primary    q 6 mo check A1c for stability/improvement Pt managing with diet and exercise to the best of his ability       Relevant Orders   HgB A1c   Pt has flu and COVID vaccine scheduled next week at pharmacy  Return in about 4 months (around 07/22/2022) for CPE, AVW.      I, Brett Kirschner, [name] have reviewed all documentation for this visit.  The documentation on  03/21/2022  for the exam, diagnosis, procedures, and orders are all accurate and complete.  Brett Kirschner, [name] [facility] [address] [place], [place], [address] Office: [phone] Fax: [place]

## 2022-03-21 NOTE — Assessment & Plan Note (Signed)
q 6 mo check A1c for stability/improvement Pt managing with diet and exercise to the best of his ability

## 2022-03-22 LAB — HEMOGLOBIN A1C
Est. average glucose Bld gHb Est-mCnc: 123 mg/dL
Hgb A1c MFr Bld: 5.9 % — ABNORMAL HIGH (ref 4.8–5.6)

## 2022-04-17 NOTE — Progress Notes (Unsigned)
     I,Sha'taria Tyson,acting as a Education administrator for [employer], [name].,have documented all relevant documentation on the behalf of Brett Kirschner, [name],as directed by  Brett Kirschner, [name] while in the presence of Brett Kirschner, [name].   Established patient visit   Patient: Brett Wilson   DOB: 03/24/1956   66 y.o. Male  MRN: [id number] Visit Date: 04/18/2022  Today's healthcare provider: [name], [name]   No chief complaint on file.  Subjective    Shoulder Pain    ***  Medications: Outpatient Medications Prior to Visit  Medication Sig  . aspirin EC 81 MG tablet Take 81 mg by mouth daily. Swallow whole.  . clopidogrel (PLAVIX) 75 MG tablet Take 1 tablet (75 mg total) by mouth daily.  . cyclobenzaprine (FLEXERIL) 5 MG tablet Take 1 tablet (5 mg total) by mouth at bedtime.  . finasteride (PROSCAR) 5 MG tablet Take 1 tablet (5 mg total) by mouth at bedtime.  . hydrocortisone 2.5 % lotion Apply topically 3 (three) times a week. Apply to aa face 3 nights weekly, Tuesday, Thursday and Saturday  . losartan (COZAAR) 100 MG tablet TAKE 1 TABLET BY MOUTH EVERY DAY  . metoprolol succinate (TOPROL-XL) 25 MG 24 hr tablet Take 25 mg by mouth daily.  . Multiple Vitamin (MULTIVITAMIN WITH MINERALS) TABS tablet Take 1 tablet by mouth daily.  . rosuvastatin (CRESTOR) 40 MG tablet Take 1 tablet (40 mg total) by mouth daily.  . Zinc 50 MG TABS Take 50 mg by mouth daily.  Marland Kitchen zolpidem (AMBIEN) 5 MG tablet Take 1 tablet (5 mg total) by mouth at bedtime as needed for sleep.   No facility-administered medications prior to visit.    Review of Systems  {Labs  Heme  Chem  Endocrine  Serology  Results Review (optional):23779}   Objective    There were no vitals taken for this visit. {Show previous vital signs (optional):23777}  Physical Exam  ***  No results found for any visits on 04/18/22.  Assessment & Plan     ***  No follow-ups on file.      {provider  attestation***:1}   Brett Kirschner, [name]  [facility] [phone] (phone) [phone] (fax)  [place]

## 2022-04-18 ENCOUNTER — Ambulatory Visit
Admission: RE | Admit: 2022-04-18 | Discharge: 2022-04-18 | Disposition: A | Discharge status: Home / Self Care | Payer: Medicare HMO | Source: Ambulatory Visit | Attending: Physician Assistant | Admitting: Physician Assistant

## 2022-04-18 ENCOUNTER — Encounter: Payer: Self-pay | Admitting: Physician Assistant

## 2022-04-18 ENCOUNTER — Ambulatory Visit
Admission: RE | Admit: 2022-04-18 | Discharge: 2022-04-18 | Disposition: A | Discharge status: Home / Self Care | Payer: Medicare HMO | Attending: Physician Assistant | Admitting: Physician Assistant

## 2022-04-18 ENCOUNTER — Ambulatory Visit (INDEPENDENT_AMBULATORY_CARE_PROVIDER_SITE_OTHER): Payer: Medicare HMO | Admitting: Physician Assistant

## 2022-04-18 ENCOUNTER — Other Ambulatory Visit: Payer: Self-pay | Admitting: Physician Assistant

## 2022-04-18 VITALS — BP 120/78 | HR 67 | Ht 67.0 in | Wt 165.0 lb

## 2022-04-18 DIAGNOSIS — M25512 Pain in left shoulder: Secondary | ICD-10-CM | POA: Insufficient documentation

## 2022-04-18 DIAGNOSIS — M25511 Pain in right shoulder: Secondary | ICD-10-CM | POA: Insufficient documentation

## 2022-04-18 DIAGNOSIS — R768 Other specified abnormal immunological findings in serum: Secondary | ICD-10-CM | POA: Diagnosis not present

## 2022-04-20 LAB — HCV RT-PCR, QUANT (GRAPH): Hepatitis C Quantitation: NOT DETECTED IU/mL

## 2022-05-16 ENCOUNTER — Other Ambulatory Visit: Payer: Self-pay | Admitting: Physician Assistant

## 2022-05-16 DIAGNOSIS — I10 Essential (primary) hypertension: Secondary | ICD-10-CM

## 2022-06-04 ENCOUNTER — Other Ambulatory Visit: Payer: Self-pay | Admitting: Physician Assistant

## 2022-06-04 DIAGNOSIS — I10 Essential (primary) hypertension: Secondary | ICD-10-CM

## 2022-06-04 NOTE — Telephone Encounter (Signed)
Unable to refill per protocol, last refill by provider 05/16/22 for 90 and 1 refill.  Requested Prescriptions  Pending Prescriptions Disp Refills   losartan (COZAAR) 100 MG tablet 90 tablet 1    Sig: Take 1 tablet (100 mg total) by mouth daily.     Cardiovascular:  Angiotensin Receptor Blockers Failed - 06/04/2022 11:10 AM      Failed - K in normal range and within 180 days    Potassium  Date Value Ref Range Status  09/05/2021 4.8 3.5 - 5.2 mmol/L Final         Passed - Cr in normal range and within 180 days    Creatinine, Ser  Date Value Ref Range Status  12/17/2021 1.00 0.61 - 1.24 mg/dL Final         Passed - Patient is not pregnant      Passed - Last BP in normal range    BP Readings from Last 1 Encounters:  04/18/22 120/78         Passed - Valid encounter within last 6 months    Recent Outpatient Visits           1 month ago Acute pain of both shoulders   [facility] Thedore Mins, [place], [name]   2 months ago Prediabetes   [facility] Thedore Mins, [place], [name]   3 months ago Thyroid nodule incidentally noted on imaging study   [facility] Thedore Mins, [place], [name]   9 months ago [employer] to [employer] preventive visit   [employer], [place], [name]   1 year ago Need for influenza vaccination   [facility] Jerrol Banana., MD       Future Appointments             In 8 months Ralene Bathe, MD [place]

## 2022-06-04 NOTE — Telephone Encounter (Signed)
Medication Refill - Medication: losartan (COZAAR) 100 MG tablet   Has the patient contacted their pharmacy? Yes.   No, more refills.   (Agent: If no, request that the patient contact the pharmacy for the refill. If patient does not wish to contact the pharmacy document the reason why and proceed with request.)   Preferred Pharmacy (with phone number or street name):  [id number] [place]  [address] [address] Phone: [phone]: [phone] Hours: Not open 24 hours   Has the patient been seen for an appointment in the last year OR does the patient have an upcoming appointment? Yes.    Agent: Please be advised that RX refills may take up to 3 business days. We ask that you follow-up with your pharmacy.

## 2022-06-28 ENCOUNTER — Ambulatory Visit: Payer: Medicare HMO | Admitting: Physician Assistant

## 2022-06-28 ENCOUNTER — Encounter: Payer: Self-pay | Admitting: Physician Assistant

## 2022-06-28 VITALS — BP 144/91 | HR 76 | Temp 98.3°F | Resp 16 | Ht 68.0 in | Wt 171.5 lb

## 2022-06-28 DIAGNOSIS — H00014 Hordeolum externum left upper eyelid: Secondary | ICD-10-CM | POA: Diagnosis not present

## 2022-06-28 MED ORDER — ERYTHROMYCIN 5 MG/GM OP OINT: 1.0000 | TOPICAL_OINTMENT | Freq: Every day | OPHTHALMIC | 0 refills | Status: DC

## 2022-06-28 NOTE — Progress Notes (Signed)
I,[name],acting as a scribe for [employer], [name].,have documented all relevant documentation on the behalf of [name], [name],as directed by  [name], [name] while in the presence of [name], [name].   Established patient visit   Patient: Brett Wilson   DOB: 23-Apr-1956   67 y.o. Male  MRN: [id number] Visit Date: 06/28/2022  Today's healthcare provider: [name], [name]   Chief Complaint  Patient presents with   Stye   Subjective    HPI  Pt reports a stye on his left upper eyelid x 8 days that is red, painful. Reports some blurring to his left eye. Denies pain with eye movements, discharge.   Medications: Outpatient Medications Prior to Visit  Medication Sig   aspirin EC 81 MG tablet Take 81 mg by mouth daily. Swallow whole.   clopidogrel (PLAVIX) 75 MG tablet Take 1 tablet (75 mg total) by mouth daily.   cyclobenzaprine (FLEXERIL) 5 MG tablet Take 1 tablet (5 mg total) by mouth at bedtime.   finasteride (PROSCAR) 5 MG tablet Take 1 tablet (5 mg total) by mouth at bedtime.   hydrocortisone 2.5 % lotion Apply topically 3 (three) times a week. Apply to aa face 3 nights weekly, Tuesday, Thursday and Saturday   losartan (COZAAR) 100 MG tablet TAKE 1 TABLET BY MOUTH EVERY DAY   metoprolol succinate (TOPROL-XL) 25 MG 24 hr tablet Take 25 mg by mouth daily.   Multiple Vitamin (MULTIVITAMIN WITH MINERALS) TABS tablet Take 1 tablet by mouth daily.   rosuvastatin (CRESTOR) 40 MG tablet Take 1 tablet (40 mg total) by mouth daily.   Zinc 50 MG TABS Take 50 mg by mouth daily.   zolpidem (AMBIEN) 5 MG tablet Take 1 tablet (5 mg total) by mouth at bedtime as needed for sleep.   No facility-administered medications prior to visit.    Review of Systems  Constitutional:  Negative for fatigue and fever.  Eyes:  Positive for pain.  Respiratory:  Negative for cough and shortness of breath.   Cardiovascular:  Negative for chest pain, palpitations and leg  swelling.  Neurological:  Negative for dizziness and headaches.      Objective    BP (!) 144/91 (BP Location: Left Arm, Patient Position: Sitting, Cuff Size: Normal) Comment: Per patient BP at home before he came was 126/74  Pulse 76   Temp 98.3 F (36.8 C) (Oral)   Resp 16   Ht '5\' 8"'$  (1.727 m)   Wt 171 lb 8 oz (77.8 kg)   BMI 26.08 kg/m    Physical Exam Vitals reviewed.  Constitutional:      Appearance: He is not ill-appearing.  HENT:     Head: Normocephalic.  Eyes:     Extraocular Movements: Extraocular movements intact.     Conjunctiva/sclera: Conjunctivae normal.     Comments: Left upper eyelid with a slightly tender pinpoint erythematous edema non draining   Cardiovascular:     Rate and Rhythm: Normal rate.  Pulmonary:     Effort: Pulmonary effort is normal. No respiratory distress.  Neurological:     General: No focal deficit present.     Mental Status: He is alert and oriented to person, place, and time.  Psychiatric:        Mood and Affect: Mood normal.        Behavior: Behavior normal.      No results found for any visits on 06/28/22.  Assessment & Plan  Left hordeolum Advised staying consistent with warm compresses multiple times a day If no improvement rx erythromycin ointment to use nightly   Return if symptoms worsen or fail to improve.      I, [name], [name] have reviewed all documentation for this visit. The documentation on  06/28/22  for the exam, diagnosis, procedures, and orders are all accurate and complete.  [name], [name] [facility] [address] [place], [place], [address] Office: [phone] Fax: [place]

## 2022-07-10 ENCOUNTER — Encounter
Admission: RE | Admit: 2022-07-10 | Discharge: 2022-07-10 | Disposition: A | Discharge status: Home / Self Care | Payer: Medicare HMO | Source: Ambulatory Visit | Attending: Orthopaedic Surgery | Admitting: Orthopaedic Surgery

## 2022-07-10 VITALS — BP 155/84 | HR 88 | Resp 16 | Ht 68.0 in | Wt 171.5 lb

## 2022-07-10 DIAGNOSIS — Z01818 Encounter for other preprocedural examination: Secondary | ICD-10-CM | POA: Diagnosis not present

## 2022-07-10 DIAGNOSIS — Z01810 Encounter for preprocedural cardiovascular examination: Secondary | ICD-10-CM

## 2022-07-10 DIAGNOSIS — I10 Essential (primary) hypertension: Secondary | ICD-10-CM | POA: Insufficient documentation

## 2022-07-10 DIAGNOSIS — I2510 Atherosclerotic heart disease of native coronary artery without angina pectoris: Secondary | ICD-10-CM | POA: Diagnosis not present

## 2022-07-10 DIAGNOSIS — R768 Other specified abnormal immunological findings in serum: Secondary | ICD-10-CM | POA: Insufficient documentation

## 2022-07-10 DIAGNOSIS — Z01812 Encounter for preprocedural laboratory examination: Secondary | ICD-10-CM

## 2022-07-10 HISTORY — DX: Benign prostatic hyperplasia without lower urinary tract symptoms: N40.0

## 2022-07-10 HISTORY — DX: Prediabetes: R73.03

## 2022-07-10 HISTORY — DX: Solitary pulmonary nodule: R91.1

## 2022-07-10 HISTORY — DX: Nontoxic single thyroid nodule: E04.1

## 2022-07-10 HISTORY — DX: Other specified diseases of liver: K76.89

## 2022-07-10 HISTORY — DX: Other specified abnormal immunological findings in serum: R76.8

## 2022-07-10 HISTORY — DX: Other specified abnormal immunological findings in serum: R76.89

## 2022-07-10 HISTORY — DX: Angina pectoris, unspecified: I20.9

## 2022-07-10 LAB — COMPREHENSIVE METABOLIC PANEL
ALT: 38 U/L (ref 0–44)
AST: 31 U/L (ref 15–41)
Albumin: 4.1 g/dL (ref 3.5–5.0)
Alkaline Phosphatase: 88 U/L (ref 38–126)
Anion gap: 11 (ref 5–15)
BUN: 21 mg/dL (ref 8–23)
CO2: 27 mmol/L (ref 22–32)
Calcium: 9.3 mg/dL (ref 8.9–10.3)
Chloride: 98 mmol/L (ref 98–111)
Creatinine, Ser: 0.96 mg/dL (ref 0.61–1.24)
GFR, Estimated: >60 mL/min (ref >60)
Glucose, Bld: 95 mg/dL (ref 70–99)
Potassium: 3.8 mmol/L (ref 3.5–5.1)
Sodium: 136 mmol/L (ref 135–145)
Total Bilirubin: 1.1 mg/dL (ref 0.3–1.2)
Total Protein: 7.4 g/dL (ref 6.5–8.1)

## 2022-07-10 LAB — CBC
HCT: 41.2 % (ref 39.0–52.0)
Hemoglobin: 13.9 g/dL (ref 13.0–17.0)
MCH: 31.2 pg (ref 26.0–34.0)
MCHC: 33.7 g/dL (ref 30.0–36.0)
MCV: 92.6 fL (ref 80.0–100.0)
Platelets: 264 10*3/uL (ref 150–400)
RBC: 4.45 MIL/uL (ref 4.22–5.81)
RDW: 12.3 % (ref 11.5–15.5)
WBC: 9 10*3/uL (ref 4.0–10.5)
nRBC: 0 % (ref 0.0–0.2)

## 2022-07-10 NOTE — Patient Instructions (Addendum)
Your procedure is scheduled on:07-22-22 Monday Report to the Registration Desk on the 1st floor of the [facility].Then proceed to the 2nd floor Surgery Desk To find out your arrival time, please call [phone] between 1PM - 3PM on:07-19-22 Friday If your arrival time is 6:00 am, do not arrive prior to that time as the [facility] entrance doors do not open until 6:00 am.  REMEMBER: Instructions that are not followed completely may result in serious medical risk, up to and including death; or upon the discretion of your surgeon and anesthesiologist your surgery may need to be rescheduled.  Do not eat food after midnight the night before surgery.  No gum chewing, lozengers or hard candies.  You may however, drink CLEAR liquids up to 2 hours before you are scheduled to arrive for your surgery. Do not drink anything within 2 hours of your scheduled arrival time.  Clear liquids include: - water  - apple juice without pulp - gatorade (not RED colors) - black coffee or tea (Do NOT add milk or creamers to the coffee or tea) Do NOT drink anything that is not on this list.  TAKE THESE MEDICATIONS THE MORNING OF SURGERY WITH A SIP OF WATER: -metoprolol succinate (TOPROL-XL)   Stop your clopidogrel (PLAVIX) 5 days prior to surgery as instructed by Dr Glade Stanford dose will be on 07-16-22 Tuesday  Continue your 81 mg Asa up until the day prior to surgery-Do NOT take the day of surgery  One week prior to surgery: Stop Anti-inflammatories (NSAIDS) such as Advil, Aleve, Ibuprofen, Motrin, Naproxen, Naprosyn and Aspirin based products such as Excedrin, Goodys Powder, BC Powder.You may however, continue to take Tylenol/Tramadol if needed for pain up until the day of surgery.  Stop ANY OVER THE COUNTER supplements/vitamins 7 days prior to surgery (Multivitamin and Zinc)  No Alcohol for 24 hours before or after surgery.  No Smoking including e-cigarettes for 24 hours prior to surgery.  No  chewable tobacco products for at least 6 hours prior to surgery.  No nicotine patches on the day of surgery.  Do not use any "recreational" drugs for at least a week prior to your surgery.  Please be advised that the combination of cocaine and anesthesia may have negative outcomes, up to and including death. If you test positive for cocaine, your surgery will be cancelled.  On the morning of surgery brush your teeth with toothpaste and water, you may rinse your mouth with mouthwash if you wish. Do not swallow any toothpaste or mouthwash.  Use CHG Soap as directed on instruction sheet.  Do not wear jewelry, make-up, hairpins, clips or nail polish.  Do not wear lotions, powders, or perfumes.   Do not shave body from the neck down 48 hours prior to surgery just in case you cut yourself which could leave a site for infection.  Also, freshly shaved skin may become irritated if using the CHG soap.  Contact lenses, hearing aids and dentures may not be worn into surgery.  Do not bring valuables to the hospital. [facility] is not responsible for any missing/lost belongings or valuables.   Notify your doctor if there is any change in your medical condition (cold, fever, infection).  Wear comfortable clothing (specific to your surgery type) to the hospital.  After surgery, you can help prevent lung complications by doing breathing exercises.  Take deep breaths and cough every 1-2 hours. Your doctor may order a device called an Incentive Spirometer to help you take  deep breaths. When coughing or sneezing, hold a pillow firmly against your incision with both hands. This is called "splinting." Doing this helps protect your incision. It also decreases belly discomfort.  If you are being admitted to the hospital overnight, leave your suitcase in the car. After surgery it may be brought to your room.  If you are being discharged the day of surgery, you will not be allowed to drive home. You will  need a responsible adult (18 years or older) to drive you home and stay with you that night.   If you are taking public transportation, you will need to have a responsible adult (18 years or older) with you. Please confirm with your physician that it is acceptable to use public transportation.   Please call the [facility] Dept. at [phone] if you have any questions about these instructions.  Surgery Visitation Policy:  Patients undergoing a surgery or procedure may have two family members or support persons with them as long as the person is not COVID-19 positive or experiencing its symptoms.   Due to an increase in RSV and influenza rates and associated hospitalizations, children ages 54 and under will not be able to visit patients in [facility]. Masks continue to be strongly recommended.

## 2022-07-11 NOTE — Progress Notes (Signed)
Called pt and informed him to continue 81 mg asa as instructed by Dr Sharlet Salina due to his heart h/o and stent. Do not take dos. Pt verbalized understanding

## 2022-07-15 ENCOUNTER — Encounter: Payer: Self-pay | Admitting: Orthopaedic Surgery

## 2022-07-15 NOTE — Progress Notes (Signed)
Perioperative / Anesthesia Services  Pre-Admission Testing Clinical Review / Preoperative Anesthesia Consult  Date: 07/18/22  Patient Demographics:  Name: Brett Wilson DOB:   Jul 13, 1955 MRN:   [id number]  Planned Surgical Procedure(s):    Case: [id number] Date/Time: 07/22/22 0715   Procedure: ARTHROSCOPY SHOULDER (Right: Shoulder)   Anesthesia type: Regional   Pre-op diagnosis: M75.101 Unsp rotator-cuff tear/rupture of right shoulder, not trauma   Location: [facility] OR ROOM 03 / [place] [facility]   Surgeons: Renee Harder, MD   NOTE: Available PAT nursing documentation and vital signs have been reviewed. Clinical nursing staff has updated patient's PMH/PSHx, current medication list, and drug allergies/intolerances to ensure comprehensive history available to assist in medical decision making as it pertains to the aforementioned surgical procedure and anticipated anesthetic course. Extensive review of available clinical information personally performed. [place] PMH and PSHx updated with any diagnoses/procedures that  may have been inadvertently omitted during his intake with the pre-admission testing department's nursing staff.  Clinical Discussion:  Brett Wilson is a 67 y.o. male who is submitted for pre-surgical anesthesia review and clearance prior to him undergoing the above procedure. Patient has never been a smoker. Pertinent PMH includes: CAD, diastolic dysfunction, typical angina, HTN, HLD, prediabetes, pituitary adenoma, RIGHT rotator cuff tear, BPH, skin cancers.  Patient is followed by cardiology Saralyn Pilar, MD). He was last seen in the cardiology clinic on 06/03/2022; notes reviewed. At the time of his clinic visit, patient doing well overall from a cardiovascular perspective. Patient denied any chest pain, shortness of breath, PND, orthopnea, palpitations, significant peripheral edema, weakness, fatigue, vertiginous symptoms, or presyncope/syncope. Patient with a  past medical history significant for cardiovascular diagnoses. Documented physical exam was grossly benign, providing no evidence of acute exacerbation of the patient's documented cardiovascular conditions. Patient has undergone the following cardiovascular testing and/or procedures.  Stress echocardiogram performed on 09/10/2021 revealing a normal left ventricular systolic function with an EF of >55%.  During the study, patient symptomatic and noted to be experiencing exertional chest pain.  There were 1-2 mm of inferolateral ST depression noted at maximum effort.  Left ventricular diastolic Doppler parameters consistent with abnormal relaxation (G1DD).  There was trivial to mild mitral, tricuspid, and pulmonary valve regurgitation.  All transvalvular gradients were noted to be normal providing no evidence suggestive of valvular stenosis.  Patient with overall good exercise tolerance.  Study determined to be normal overall.  Coronary CTA performed on 12/19/2021 revealed an elevated calcium score of 220, which was the 1 percentile for age and sex matched control.  Study suggestive of greater than 70% stenosis and the proximal LAD territory.  FFR analysis was 0.69 (normal range >0.80) suggestive of significant vessel stenosis.  Diagnostic LEFT heart catheterization was performed on 12/26/2021 revealing multivessel CAD; 40% proximal and mid RCA and 90% proximal and mid LAD.  Subsequent PCI was performed placing a 2.75 x 26 mm Onyx Frontier DES x 1 to the mid LAD yielding excellent angiographic result and TIMI-3 flow.  Blood pressure mildly elevated at 140/70 mmHg on currently prescribed ARB (losartan) and beta-blocker (metoprolol succinate) therapy.  Patient is on rosuvastatin for his HLD diagnosis and ASCVD prevention.  Patient has a prediabetes diagnosis; last HgbA1c was 5.9% when checked on 03/21/2022. He does not have an OSAH diagnosis. Functional capacity, as defined by DASI, is documented as being >/= 4  METS. No changes were made to his medication regimen during his visit with cardiology.  Patient scheduled to follow-up with outpatient cardiology  in 3 months or sooner if needed.  Brett Wilson is scheduled for an elective RIGHT SHOULDER ARTHROSCOPY on 07/22/2022 with Dr. Renee Harder, MD. given patient's past medical history significant for cardiovascular diagnoses, presurgical cardiac clearance was sought by the PAT team. Per cardiology, "this patient is optimized for surgery and may proceed with the planned procedural course with a LOW risk of significant perioperative cardiovascular complications".    Again, given patient's recent stent placement, he remains on daily DAPT therapy.  Patient has been instructed on recommendations from his cardiologist for holding his clopidogrel dose for 5 days prior to his procedure with plans to restart as soon as postoperative bleeding risk felt to be minimized by his primary attending surgeon.  The patient is aware that his last dose of clopidogrel should be on 07/16/2022.  The patient has been instructed by Dr. Sharlet Salina to continue his daily low-dose ASA throughout his perioperative course.  Patient denies previous perioperative complications with anesthesia in the past. In review of the available records, it is noted that patient underwent a general anesthetic course here at [facility] (ASA II) in 11/2019 without documented complications.      07/10/2022   10:17 AM 06/28/2022    1:40 PM 04/18/2022   11:26 AM  Vitals with BMI  Height '5\' 8"'$  '5\' 8"'$    Weight 171 lbs 8 oz 171 lbs 8 oz   BMI 10.27 25.36   Systolic 644 034 742  Diastolic 84 91 78  Pulse 88 76     Providers/Specialists:   NOTE: Primary physician provider listed below. Patient may have been seen by APP or partner within same practice.   PROVIDER ROLE / SPECIALTY LAST Loma Boston, MD Orthopedics (Surgeon) 05/23/2022  [name], [name] Primary  Care Provider 06/28/2022  Renee Harder, MD Cardiology 06/03/2022   Allergies:  Penicillins  Current Home Medications:   No current facility-administered medications for this encounter.    aspirin EC 81 MG tablet   clopidogrel (PLAVIX) 75 MG tablet   finasteride (PROSCAR) 5 MG tablet   losartan (COZAAR) 100 MG tablet   metoprolol succinate (TOPROL-XL) 25 MG 24 hr tablet   Multiple Vitamin (MULTIVITAMIN WITH MINERALS) TABS tablet   rosuvastatin (CRESTOR) 40 MG tablet   traMADol (ULTRAM) 50 MG tablet   Zinc 50 MG TABS   zolpidem (AMBIEN) 5 MG tablet   History:   Past Medical History:  Diagnosis Date   Allergy    BPH (benign prostatic hyperplasia)    Coronary artery disease 12/19/2021   a.) cCTA 12/19/2021: Ca score 220 (66th percentile for age/sex match control); >70% pLAD territory (FFR 0.69); b.) LHC/PCI 12/26/2021: 40% p-mRCA, 90% p-mLAD (2.75 x 26 mm Onxy Frontier DES x 1)   Diastolic dysfunction 59/56/3875   a.) stress TTE 09/10/2021: EF> 55%, triv-mild MR/TR/PR, G1DD.   Dysplastic nevus 01/17/2020   Left mid side. Severe atypia with scar, close to margin. exc 03/14/2020   Dysplastic nevus 01/17/2020   Left calf. Moderate atypia, deep margin involved.    Dysplastic nevus 01/17/2020   LUQ abdomen. Mild atypia, limited margins free.    Hepatitis C antibody test positive    Hyperlipidemia    Hypertension    Liver nodule    Long term current use of antithrombotics/antiplatelets    a.) on DAPT (ASA + clopidogrel)   Lung nodule    Nontraumatic rotator cuff tear, right    Pituitary adenoma ([place])    Pre-diabetes  Thyroid nodule    Typical angina [facility])    Past Surgical History:  Procedure Laterality Date   CATARACT EXTRACTION Bilateral    COLONOSCOPY WITH PROPOFOL N/A 04/13/2019   Procedure: COLONOSCOPY WITH PROPOFOL;  Surgeon: Lucilla Lame, MD;  Location: [facility] ENDOSCOPY;  Service: Endoscopy;  Laterality: N/A;   CORONARY STENT INTERVENTION N/A 12/26/2021    Procedure: CORONARY STENT INTERVENTION;  Surgeon: Andrez Grime, MD;  Location: [place] CV LAB;  Service: Cardiovascular;  Laterality: N/A;   CYSTOSCOPY WITH INSERTION OF UROLIFT     CYSTOSCOPY WITH INSERTION OF UROLIFT N/A 11/29/2019   Procedure: CYSTOSCOPY WITH INSERTION OF UROLIFT;  Surgeon: Hollice Espy, MD;  Location: [facility] ORS;  Service: Urology;  Laterality: N/A;   HOLEP-LASER ENUCLEATION OF THE PROSTATE WITH MORCELLATION N/A 11/29/2019   Procedure: HOLEP-LASER ENUCLEATION OF THE PROSTATE;  Surgeon: Hollice Espy, MD;  Location: [facility] ORS;  Service: Urology;  Laterality: N/A;   INTRAVASCULAR ULTRASOUND/IVUS N/A 12/26/2021   Procedure: Intravascular Ultrasound/IVUS;  Surgeon: Andrez Grime, MD;  Location: [place] CV LAB;  Service: Cardiovascular;  Laterality: N/A;   KNEE ARTHROSCOPY  2008   LEFT HEART CATH AND CORONARY ANGIOGRAPHY N/A 12/26/2021   Procedure: LEFT HEART CATH AND CORONARY ANGIOGRAPHY;  Surgeon: Andrez Grime, MD;  Location: [place] CV LAB;  Service: Cardiovascular;  Laterality: N/A;   PITUITARY EXCISION     TONSILLECTOMY     Family History  Problem Relation Age of Onset   Hypertension Mother    Colon cancer Father    Social History   Tobacco Use   Smoking status: Never   Smokeless tobacco: Never  Vaping Use   Vaping Use: Never used  Substance Use Topics   Alcohol use: Not Currently   Drug use: Never    Pertinent Clinical Results:  LABS: Labs reviewed: Acceptable for surgery.  No visits with results within 3 Day(s) from this visit.  Latest known visit with results is:  Hospital Outpatient Visit on 07/10/2022  Component Date Value Ref Range Status   Sodium 07/10/2022 136  135 - 145 mmol/L Final   Potassium 07/10/2022 3.8  3.5 - 5.1 mmol/L Final   Chloride 07/10/2022 98  98 - 111 mmol/L Final   CO2 07/10/2022 27  22 - 32 mmol/L Final   Glucose, Bld 07/10/2022 95  70 - 99 mg/dL Final   Glucose reference range applies  only to samples taken after fasting for at least 8 hours.   BUN 07/10/2022 21  8 - 23 mg/dL Final   Creatinine, Ser 07/10/2022 0.96  0.61 - 1.24 mg/dL Final   Calcium 07/10/2022 9.3  8.9 - 10.3 mg/dL Final   Total Protein 07/10/2022 7.4  6.5 - 8.1 g/dL Final   Albumin 07/10/2022 4.1  3.5 - 5.0 g/dL Final   AST 07/10/2022 31  15 - 41 U/L Final   ALT 07/10/2022 38  0 - 44 U/L Final   Alkaline Phosphatase 07/10/2022 88  38 - 126 U/L Final   Total Bilirubin 07/10/2022 1.1  0.3 - 1.2 mg/dL Final   GFR, Estimated 07/10/2022 >60  >60 mL/min Final   Comment: (NOTE) Calculated using the CKD-EPI Creatinine Equation (2021)    Anion gap 07/10/2022 11  5 - 15 Final   Performed at [facility], [place], [place] [address]   WBC 07/10/2022 9.0  4.0 - 10.5 K/uL Final   RBC 07/10/2022 4.45  4.22 - 5.81 MIL/uL Final   Hemoglobin 07/10/2022 13.9  13.0 - 17.0 g/dL Final   HCT 07/10/2022 41.2  39.0 - 52.0 % Final   MCV 07/10/2022 92.6  80.0 - 100.0 fL Final   MCH 07/10/2022 31.2  26.0 - 34.0 pg Final   MCHC 07/10/2022 33.7  30.0 - 36.0 g/dL Final   RDW 07/10/2022 12.3  11.5 - 15.5 % Final   Platelets 07/10/2022 264  150 - 400 K/uL Final   nRBC 07/10/2022 0.0  0.0 - 0.2 % Final   Performed at [facility], [address] [place], [place] [address]    ECG: Date: 07/10/2022 Time ECG obtained: 1035 AM Rate: 72 bpm Rhythm: normal sinus Axis (leads I and aVF): Normal Intervals: PR 176 ms. QRS 90 ms. QTc 413 ms. ST segment and T wave changes: No evidence of acute ST segment elevation or depression Comparison: Similar to previous tracing obtained on 12/26/2021  IMAGING / PROCEDURES: CT CORONARY MORPH W/CTA COR W/SCORE W/CA W/CM &/OR WO/CM performed on  Coronary calcium score of 220. This was 66th percentile for age and sex matched control Normal coronary origin with right dominance. Calcified and non calcified plaque in the proximal LAD segment causing severe  stenosis (>70%). Calcified plaque causing mild proximal RCA stenosis. FFR analysis (normal range >0.80) Left Main:  No significant stenosis. LAD: FFRct 0.69 -- significant stenosis in the mid LAD noted LCX: FFRct 0.89 -- no significant stenosis.  RCA: Not evaluated due to significant motion artifacts. CAD-RADS 4 Severe stenosis. (70-99% or > 50% left main). Cardiac catheterization is recommended. Consider symptom-guided anti-ischemic pharmacotherapy as well as risk factor modification per guideline directed care. No significant extracardiac findings  LEFT HEART CATHETERIZATION AND CORONARY ANGIOGRAPHY performed on 12/26/2021 Normal left ventricular systolic function with an EF of 55 to 65% Normal LVEDP No aortic valve stenosis Successful PCI 2.75 x 26 mm Onyx Frontier DES x 1 to the mid LAD yielding excellent angiographic result and TIMI-3 flow Recommendations DAPT therapy for at least 6 months Intensification with secondary prevention with increased dose of rosuvastatin to 40 mg daily Ambulatory referral to cardiac rehab   Impression and Plan:  Brett Wilson has been referred for pre-anesthesia review and clearance prior to him undergoing the planned anesthetic and procedural courses. Available labs, pertinent testing, and imaging results were personally reviewed by me in preparation for upcoming operative/procedural course. [facility] medical record has been updated following extensive record review and patient interview with PAT staff.   This patient has been appropriately cleared by cardiology with an overall LOW risk of significant perioperative cardiovascular complications. Based on clinical review performed today (07/18/22), barring any significant acute changes in the patient's overall condition, it is anticipated that he will be able to proceed with the planned surgical intervention. Any acute changes in clinical condition may necessitate his procedure being postponed and/or  cancelled. Patient will meet with anesthesia team (MD and/or CRNA) on the day of his procedure for preoperative evaluation/assessment. Questions regarding anesthetic course will be fielded at that time.   Pre-surgical instructions were reviewed with the patient during his PAT appointment, and questions were fielded to satisfaction by PAT clinical staff. He has been instructed on which medications that he will need to hold prior to surgery, as well as the ones that have been deemed safe/appropriate to take of the day of his procedure. As part of the general education provided by PAT, patient made aware both verbally and in writing, that he will need to abstain from the use of any illegal  substances during his perioperative course.  He was advised that failure to do so could necessitate the need for case cancellation or result serious perioperative complications up to and including death. Patient encouraged to contact PAT and/or his surgeon's office to discuss any questions or concerns that may arise prior to surgery; verbalized understanding.   Honor Loh, MSN, APRN, FNP-C, CEN [facility]  Peri-operative Services Nurse Practitioner Phone: [phone] Fax: [phone] 07/18/22 8:29 AM  NOTE: This note has been prepared using Dragon dictation software. Despite my best ability to proofread, there is always the potential that unintentional transcriptional errors may still occur from this process.

## 2022-07-18 ENCOUNTER — Encounter: Payer: Self-pay | Admitting: Orthopaedic Surgery

## 2022-07-22 ENCOUNTER — Ambulatory Visit: Payer: Medicare HMO | Admitting: Urgent Care

## 2022-07-22 ENCOUNTER — Encounter: Payer: Self-pay | Admitting: Orthopaedic Surgery

## 2022-07-22 ENCOUNTER — Encounter
Admission: RE | Disposition: A | Discharge status: Home / Self Care | Payer: Self-pay | Source: Ambulatory Visit | Attending: Orthopaedic Surgery

## 2022-07-22 ENCOUNTER — Other Ambulatory Visit: Payer: Self-pay

## 2022-07-22 ENCOUNTER — Ambulatory Visit
Admission: RE | Admit: 2022-07-22 | Discharge: 2022-07-22 | Disposition: A | Discharge status: Home / Self Care | Payer: Medicare HMO | Source: Ambulatory Visit | Attending: Orthopaedic Surgery | Admitting: Orthopaedic Surgery

## 2022-07-22 ENCOUNTER — Ambulatory Visit: Payer: Medicare HMO

## 2022-07-22 DIAGNOSIS — I2510 Atherosclerotic heart disease of native coronary artery without angina pectoris: Secondary | ICD-10-CM | POA: Diagnosis not present

## 2022-07-22 DIAGNOSIS — M94211 Chondromalacia, right shoulder: Secondary | ICD-10-CM | POA: Diagnosis not present

## 2022-07-22 DIAGNOSIS — Z7901 Long term (current) use of anticoagulants: Secondary | ICD-10-CM | POA: Diagnosis not present

## 2022-07-22 DIAGNOSIS — I10 Essential (primary) hypertension: Secondary | ICD-10-CM | POA: Diagnosis not present

## 2022-07-22 DIAGNOSIS — W19XXXA Unspecified fall, initial encounter: Secondary | ICD-10-CM | POA: Insufficient documentation

## 2022-07-22 DIAGNOSIS — M129 Arthropathy, unspecified: Secondary | ICD-10-CM | POA: Insufficient documentation

## 2022-07-22 DIAGNOSIS — S46011A Strain of muscle(s) and tendon(s) of the rotator cuff of right shoulder, initial encounter: Secondary | ICD-10-CM | POA: Insufficient documentation

## 2022-07-22 DIAGNOSIS — Z955 Presence of coronary angioplasty implant and graft: Secondary | ICD-10-CM | POA: Insufficient documentation

## 2022-07-22 DIAGNOSIS — M7551 Bursitis of right shoulder: Secondary | ICD-10-CM | POA: Diagnosis not present

## 2022-07-22 DIAGNOSIS — M75121 Complete rotator cuff tear or rupture of right shoulder, not specified as traumatic: Secondary | ICD-10-CM | POA: Diagnosis present

## 2022-07-22 DIAGNOSIS — M65811 Other synovitis and tenosynovitis, right shoulder: Secondary | ICD-10-CM | POA: Diagnosis not present

## 2022-07-22 HISTORY — DX: Long term (current) use of antithrombotics/antiplatelets: Z79.02

## 2022-07-22 HISTORY — DX: Unspecified rotator cuff tear or rupture of right shoulder, not specified as traumatic: M75.101

## 2022-07-22 HISTORY — PX: SHOULDER ARTHROSCOPY: SHX128

## 2022-07-22 SURGERY — ARTHROSCOPY, SHOULDER
Anesthesia: General | Site: Shoulder | Laterality: Right

## 2022-07-22 MED ORDER — CHLORHEXIDINE GLUCONATE 0.12 % MT SOLN
OROMUCOSAL | Status: AC
  Administered 2022-07-22: 15 mL via OROMUCOSAL
  Filled 2022-07-22: qty 15

## 2022-07-22 MED ORDER — LIDOCAINE HCL (PF) 2 % IJ SOLN
INTRAMUSCULAR | Status: AC
  Filled 2022-07-22: qty 5

## 2022-07-22 MED ORDER — BUPIVACAINE HCL (PF) 0.5 % IJ SOLN
INTRAMUSCULAR | Status: AC
  Filled 2022-07-22: qty 10

## 2022-07-22 MED ORDER — ACETAMINOPHEN 10 MG/ML IV SOLN
INTRAVENOUS | Status: DC | PRN
  Administered 2022-07-22: 1000 mg via INTRAVENOUS

## 2022-07-22 MED ORDER — EPINEPHRINE PF 1 MG/ML IJ SOLN
INTRAMUSCULAR | Status: AC
  Filled 2022-07-22: qty 4

## 2022-07-22 MED ORDER — DEXAMETHASONE SODIUM PHOSPHATE 10 MG/ML IJ SOLN
INTRAMUSCULAR | Status: DC | PRN
  Administered 2022-07-22: 5 mg via INTRAVENOUS

## 2022-07-22 MED ORDER — FAMOTIDINE 20 MG PO TABS: 20.0000 mg | ORAL_TABLET | Freq: Once | ORAL | Status: AC

## 2022-07-22 MED ORDER — ONDANSETRON HCL 4 MG/2ML IJ SOLN
INTRAMUSCULAR | Status: DC | PRN
  Administered 2022-07-22: 4 mg via INTRAVENOUS

## 2022-07-22 MED ORDER — ACETAMINOPHEN 10 MG/ML IV SOLN
INTRAVENOUS | Status: AC
  Filled 2022-07-22: qty 100

## 2022-07-22 MED ORDER — LACTATED RINGERS IV SOLN: INTRAVENOUS | Status: DC

## 2022-07-22 MED ORDER — LIDOCAINE HCL (PF) 1 % IJ SOLN
INTRAMUSCULAR | Status: DC | PRN
  Administered 2022-07-22: 3 mL

## 2022-07-22 MED ORDER — BUPIVACAINE HCL (PF) 0.5 % IJ SOLN
INTRAMUSCULAR | Status: DC | PRN
  Administered 2022-07-22: 10 mL

## 2022-07-22 MED ORDER — ONDANSETRON HCL 4 MG/2ML IJ SOLN
INTRAMUSCULAR | Status: AC
  Filled 2022-07-22: qty 2

## 2022-07-22 MED ORDER — PHENYLEPHRINE HCL (PRESSORS) 10 MG/ML IV SOLN
INTRAVENOUS | Status: AC
  Filled 2022-07-22: qty 1

## 2022-07-22 MED ORDER — CHLORHEXIDINE GLUCONATE 0.12 % MT SOLN: 15.0000 mL | Freq: Once | OROMUCOSAL | Status: AC

## 2022-07-22 MED ORDER — HYDROMORPHONE HCL 1 MG/ML IJ SOLN: 0.2500 mg | INTRAMUSCULAR | Status: DC | PRN

## 2022-07-22 MED ORDER — BUPIVACAINE LIPOSOME 1.3 % IJ SUSP
INTRAMUSCULAR | Status: AC
  Filled 2022-07-22: qty 10

## 2022-07-22 MED ORDER — ROCURONIUM BROMIDE 10 MG/ML (PF) SYRINGE
PREFILLED_SYRINGE | INTRAVENOUS | Status: AC
  Filled 2022-07-22: qty 10

## 2022-07-22 MED ORDER — ROCURONIUM BROMIDE 100 MG/10ML IV SOLN
INTRAVENOUS | Status: DC | PRN
  Administered 2022-07-22: 50 mg via INTRAVENOUS

## 2022-07-22 MED ORDER — PROPOFOL 10 MG/ML IV BOLUS
INTRAVENOUS | Status: DC | PRN
  Administered 2022-07-22: 40 mg via INTRAVENOUS
  Administered 2022-07-22: 130 mg via INTRAVENOUS

## 2022-07-22 MED ORDER — RINGERS IRRIGATION IR SOLN
Status: DC | PRN
  Administered 2022-07-22: 12000 mL
  Administered 2022-07-22 (×3): 3000 mL

## 2022-07-22 MED ORDER — MIDAZOLAM HCL 2 MG/2ML IJ SOLN
INTRAMUSCULAR | Status: AC
  Filled 2022-07-22: qty 2

## 2022-07-22 MED ORDER — FAMOTIDINE 20 MG PO TABS
ORAL_TABLET | ORAL | Status: AC
  Administered 2022-07-22: 20 mg via ORAL
  Filled 2022-07-22: qty 1

## 2022-07-22 MED ORDER — OXYCODONE HCL 5 MG PO TABS: 5.0000 mg | ORAL_TABLET | Freq: Once | ORAL | Status: DC | PRN

## 2022-07-22 MED ORDER — PHENYLEPHRINE HCL-NACL 20-0.9 MG/250ML-% IV SOLN
INTRAVENOUS | Status: DC | PRN
  Administered 2022-07-22: 25 ug/min via INTRAVENOUS

## 2022-07-22 MED ORDER — DEXMEDETOMIDINE HCL IN NACL 80 MCG/20ML IV SOLN
INTRAVENOUS | Status: AC
  Filled 2022-07-22: qty 20

## 2022-07-22 MED ORDER — DEXMEDETOMIDINE HCL IN NACL 200 MCG/50ML IV SOLN
INTRAVENOUS | Status: DC | PRN
  Administered 2022-07-22: 12 ug via INTRAVENOUS

## 2022-07-22 MED ORDER — FENTANYL CITRATE (PF) 100 MCG/2ML IJ SOLN
INTRAMUSCULAR | Status: DC | PRN
  Administered 2022-07-22 (×2): 50 ug via INTRAVENOUS

## 2022-07-22 MED ORDER — ORAL CARE MOUTH RINSE: 15.0000 mL | Freq: Once | OROMUCOSAL | Status: AC

## 2022-07-22 MED ORDER — LIDOCAINE HCL (PF) 1 % IJ SOLN
INTRAMUSCULAR | Status: AC
  Filled 2022-07-22: qty 5

## 2022-07-22 MED ORDER — EPINEPHRINE 1 MG/ML IJ SOLN
INTRAMUSCULAR | Status: DC | PRN
  Administered 2022-07-22: 4 mL

## 2022-07-22 MED ORDER — LIDOCAINE HCL (CARDIAC) PF 100 MG/5ML IV SOSY
PREFILLED_SYRINGE | INTRAVENOUS | Status: DC | PRN
  Administered 2022-07-22: 100 mg via INTRAVENOUS

## 2022-07-22 MED ORDER — PHENYLEPHRINE HCL-NACL 20-0.9 MG/250ML-% IV SOLN
INTRAVENOUS | Status: AC
  Filled 2022-07-22: qty 250

## 2022-07-22 MED ORDER — PROPOFOL 10 MG/ML IV BOLUS
INTRAVENOUS | Status: AC
  Filled 2022-07-22: qty 20

## 2022-07-22 MED ORDER — MIDAZOLAM HCL 2 MG/2ML IJ SOLN: 2.0000 mg | Freq: Once | INTRAMUSCULAR | Status: AC

## 2022-07-22 MED ORDER — OXYCODONE HCL 5 MG/5ML PO SOLN: 5.0000 mg | Freq: Once | ORAL | Status: DC | PRN

## 2022-07-22 MED ORDER — BUPIVACAINE LIPOSOME 1.3 % IJ SUSP
INTRAMUSCULAR | Status: DC | PRN
  Administered 2022-07-22: 10 mL

## 2022-07-22 MED ORDER — CEFAZOLIN SODIUM-DEXTROSE 2-4 GM/100ML-% IV SOLN
2.0000 g | INTRAVENOUS | Status: AC
  Administered 2022-07-22: 2 g via INTRAVENOUS

## 2022-07-22 MED ORDER — FENTANYL CITRATE (PF) 100 MCG/2ML IJ SOLN
INTRAMUSCULAR | Status: AC
  Filled 2022-07-22: qty 2

## 2022-07-22 MED ORDER — CEFAZOLIN SODIUM-DEXTROSE 2-4 GM/100ML-% IV SOLN
INTRAVENOUS | Status: AC
  Filled 2022-07-22: qty 100

## 2022-07-22 MED ORDER — MIDAZOLAM HCL 2 MG/2ML IJ SOLN
INTRAMUSCULAR | Status: AC
  Administered 2022-07-22: 2 mg via INTRAVENOUS
  Filled 2022-07-22: qty 2

## 2022-07-22 SURGICAL SUPPLY — 84 items
ADAPTER IRRIG TUBE 2 SPIKE SOL (ADAPTER) ×2 IMPLANT
ANCH DBL 2.6 SLF-PNCH FIBERTAK (Anchor) ×1 IMPLANT
ANCHOR DBL 2.6 SLF-PNCH FIBRTK (Anchor) IMPLANT
ANCHOR SUT BIOCOMP LK 2.9X12.5 (Anchor) ×1 IMPLANT
ANCHOR SUT FBRTK 2.6 SOFT 1.7 (Anchor) IMPLANT
ANCHOR SUT FBRTK 2.6 SP1.7 TAP (Anchor) IMPLANT
ANCHOR SVLK SP PEEK 4.75X24.5 (Anchor) IMPLANT
BENZOIN TINCTURE PRP APPL 2/3 (GAUZE/BANDAGES/DRESSINGS) IMPLANT
BLADE INCISOR PLUS 4.5 (BLADE) ×1 IMPLANT
BRUSH SCRUB EZ  4% CHG (MISCELLANEOUS)
BRUSH SCRUB EZ 4% CHG (MISCELLANEOUS) ×1 IMPLANT
BUR BR 5.5 WIDE MOUTH (BURR) IMPLANT
CANNULA 5.75X7 CRYSTAL CLEAR (CANNULA) ×1 IMPLANT
CANNULA PARTIAL THREAD 2X7 (CANNULA) IMPLANT
CANNULA PASSPORT 5 (CANNULA) IMPLANT
CANNULA SHOULDER 7CM (CANNULA) ×1 IMPLANT
CANNULA TWIST IN 8.25X9CM (CANNULA) ×1 IMPLANT
CHLORAPREP W/TINT 26 (MISCELLANEOUS) ×1 IMPLANT
COOLER POLAR GLACIER W/PUMP (MISCELLANEOUS) ×1 IMPLANT
DEVICE SUCT BLK HOLE OR FLOOR (MISCELLANEOUS) IMPLANT
DRAPE 3/4 80X56 (DRAPES) ×1 IMPLANT
DRAPE INCISE IOBAN 66X45 STRL (DRAPES) ×1 IMPLANT
DRAPE ORTHO SPLIT 77X108 STRL (DRAPES)
DRAPE STERI 35X30 U-POUCH (DRAPES) ×1 IMPLANT
DRAPE SURG ORHT 6 SPLT 77X108 (DRAPES) ×2 IMPLANT
DRAPE U-SHAPE 47X51 STRL (DRAPES) ×1 IMPLANT
ELECT REM PT RETURN 9FT ADLT (ELECTROSURGICAL) ×1
ELECTRODE REM PT RTRN 9FT ADLT (ELECTROSURGICAL) IMPLANT
FIBER TAPE 2MM (SUTURE) IMPLANT
GAUZE 4X4 16PLY ~~LOC~~+RFID DBL (SPONGE) ×1 IMPLANT
GAUZE SPONGE 4X4 12PLY STRL (GAUZE/BANDAGES/DRESSINGS) ×1 IMPLANT
GAUZE XEROFORM 1X8 LF (GAUZE/BANDAGES/DRESSINGS) ×1 IMPLANT
GLOVE BIO SURGEON STRL SZ7.5 (GLOVE) ×2 IMPLANT
GLOVE BIOGEL PI IND STRL 7.5 (GLOVE) ×1 IMPLANT
GLOVE SURG ORTHO 8.0 STRL STRW (GLOVE) ×1 IMPLANT
GOWN STRL REUS W/ TWL XL LVL3 (GOWN DISPOSABLE) ×2 IMPLANT
GOWN STRL REUS W/TWL XL LVL3 (GOWN DISPOSABLE) ×2
IV LACTATED RINGER IRRG 3000ML (IV SOLUTION) ×7
IV LR IRRIG 3000ML ARTHROMATIC (IV SOLUTION) ×4 IMPLANT
KIT ANCHOR FBRTK 2.6 STR (KITS) IMPLANT
KIT INSERTION 2.9 PUSHLOCK (KITS) ×1 IMPLANT
KIT KNEE FIBERTAK DISP (KITS) IMPLANT
KIT STABILIZATION SHOULDER (MISCELLANEOUS) ×1 IMPLANT
KIT TURNOVER KIT A (KITS) ×1 IMPLANT
LASSO 25 DEG RIGHT QUICKPASS (SUTURE) IMPLANT
LASSO 45 DEG LEFT QUICKPASS (SUTURE) IMPLANT
MANIFOLD NEPTUNE II (INSTRUMENTS) ×3 IMPLANT
MASK FACE SPIDER DISP (MASK) ×1 IMPLANT
MAT ABSORB  FLUID 56X50 GRAY (MISCELLANEOUS) ×1
MAT ABSORB FLUID 56X50 GRAY (MISCELLANEOUS) ×1 IMPLANT
NDL MAYO CATGUT SZ5 (NEEDLE) ×1
NDL SAFETY ECLIP 18X1.5 (MISCELLANEOUS) ×1 IMPLANT
NDL SCORPION MULTI FIRE (NEEDLE) IMPLANT
NDL SPNL 18GX3.5 QUINCKE PK (NEEDLE) ×1 IMPLANT
NDL SUT 5 .5 CRC TPR PNT MAYO (NEEDLE) ×1 IMPLANT
NEEDLE HYPO 22GX1.5 SAFETY (NEEDLE) ×1 IMPLANT
NEEDLE SCORPION MULTI FIRE (NEEDLE) ×1 IMPLANT
NEEDLE SPNL 18GX3.5 QUINCKE PK (NEEDLE) IMPLANT
PACK ARTHROSCOPY SHOULDER (MISCELLANEOUS) ×1 IMPLANT
PAD ABD DERMACEA PRESS 5X9 (GAUZE/BANDAGES/DRESSINGS) IMPLANT
PAD ARMBOARD 7.5X6 YLW CONV (MISCELLANEOUS) ×2 IMPLANT
PAD WRAPON POLAR SHDR XLG (MISCELLANEOUS) ×1 IMPLANT
PassPort Button IMPLANT
SLEEVE REMOTE CONTROL 5X12 (DRAPES) IMPLANT
SLING ULTRA II LG (MISCELLANEOUS) ×1 IMPLANT
SLING ULTRA II M (MISCELLANEOUS) IMPLANT
SPONGE T-LAP 18X18 ~~LOC~~+RFID (SPONGE) ×1 IMPLANT
STRAP SAFETY 5IN WIDE (MISCELLANEOUS) ×1 IMPLANT
STRIP CLOSURE SKIN 1/4X4 (GAUZE/BANDAGES/DRESSINGS) IMPLANT
SUT ETHILON 3-0 (SUTURE) IMPLANT
SUT ETHILON NAB PS2 4-0 18IN (SUTURE) ×1 IMPLANT
SUT MNCRL AB 3-0 PS2 27 (SUTURE) IMPLANT
SUT VIC AB 2-0 CT2 27 (SUTURE) IMPLANT
SUT VICRYL 0 UR6 27IN ABS (SUTURE) IMPLANT
SYR 10ML LL (SYRINGE) ×1 IMPLANT
TAPE MICROFOAM 4IN (TAPE) ×1 IMPLANT
TRAP FLUID SMOKE EVACUATOR (MISCELLANEOUS) ×1 IMPLANT
TUBING CONNECTING 10 (TUBING) ×1 IMPLANT
TUBING INFLOW SET DBFLO PUMP (TUBING) ×1 IMPLANT
TUBING OUTFLOW SET DBLFO PUMP (TUBING) ×1 IMPLANT
WAND WEREWOLF FLOW 90D (MISCELLANEOUS) ×1 IMPLANT
WATER STERILE IRR 500ML POUR (IV SOLUTION) ×1 IMPLANT
WRAPON POLAR PAD SHDR XLG (MISCELLANEOUS)
knee fibertak disposables IMPLANT

## 2022-07-22 NOTE — H&P (Signed)
The patient has been re-examined, and the chart reviewed, and there have been no interval changes to the documented history and physical.    The risks, benefits, and alternatives have been discussed at length, and the patient is willing to proceed.    Renee Harder

## 2022-07-22 NOTE — Transfer of Care (Signed)
Immediate Anesthesia Transfer of Care Note  Patient: Brett Wilson  Procedure(s) Performed: ARTHROSCOPY SHOULDER (Right: Shoulder)  Patient Location: PACU  Anesthesia Type:General  Level of Consciousness: drowsy  Airway & Oxygen Therapy: Patient Spontanous Breathing  Post-op Assessment: Report given to RN and Post -op Vital signs reviewed and stable  Post vital signs: Reviewed and stable  Last Vitals:  Vitals Value Taken Time  BP 118/73 07/22/22 1107  Temp 37.3 C 07/22/22 1107  Pulse 77 07/22/22 1111  Resp 14 07/22/22 1111  SpO2 99 % 07/22/22 1111  Vitals shown include unvalidated device data.  Last Pain:  Vitals:   07/22/22 0611  TempSrc: Temporal  PainSc: 8          Complications: No notable events documented.

## 2022-07-22 NOTE — Anesthesia Postprocedure Evaluation (Signed)
Anesthesia Post Note  Patient: Gurshaan Matsuoka  Procedure(s) Performed: ARTHROSCOPY SHOULDER (Right: Shoulder)  Patient location during evaluation: PACU Anesthesia Type: General and Regional Level of consciousness: awake and alert Pain management: pain level controlled Vital Signs Assessment: post-procedure vital signs reviewed and stable Respiratory status: spontaneous breathing, nonlabored ventilation, respiratory function stable and patient connected to nasal cannula oxygen Cardiovascular status: blood pressure returned to baseline and stable Postop Assessment: no apparent nausea or vomiting Anesthetic complications: no   No notable events documented.   Last Vitals:  Vitals:   07/22/22 1115 07/22/22 1130  BP: 121/70 129/72  Pulse: 78 80  Resp: 12 10  Temp:    SpO2: 98% 95%    Last Pain:  Vitals:   07/22/22 1130  TempSrc:   PainSc: 0-No pain                 Ilene Qua

## 2022-07-22 NOTE — Brief Op Note (Signed)
07/22/2022  10:53 AM  PATIENT:  Brett Wilson  67 y.o. male  PRE-OPERATIVE DIAGNOSIS:  M75.101 Unsp rotatr-cuff tear/ruptr of right shoulder, not trauma  POST-OPERATIVE DIAGNOSIS:  M75.101 Unsp rotatr-cuff tear/ruptr of right shoulder, not trauma  PROCEDURE:  Procedure(s) with comments: ARTHROSCOPY SHOULDER (Right) - SUBACROMIAL DECOMPRESSION, OPEN BICEPS TENODESIS rotator cuff repair  SURGEON:  Surgeon(s) and Role:    Renee Harder, MD - Primary  PHYSICIAN ASSISTANT:   ASSISTANTS: Zanette PA   ANESTHESIA:   general  EBL:  50cc   TOURNIQUET:  * No tourniquets in log *  DICTATION: .Dragon Dictation  PLAN OF CARE: Discharge to home after PACU  PATIENT DISPOSITION:  PACU - hemodynamically stable.   Delay start of Pharmacological VTE agent (>24hrs) due to surgical blood loss or risk of bleeding: no

## 2022-07-22 NOTE — Anesthesia Preprocedure Evaluation (Signed)
Anesthesia Evaluation  Patient identified by MRN, date of birth, ID band Patient awake    Reviewed: Allergy & Precautions, NPO status , Patient's Chart, lab work & pertinent test results  History of Anesthesia Complications Negative for: history of anesthetic complications  Airway Mallampati: III  TM Distance: >3 FB Neck ROM: full    Dental no notable dental hx.    Pulmonary neg pulmonary ROS   Pulmonary exam normal        Cardiovascular Exercise Tolerance: Good hypertension, On Medications (-) angina + CAD and + Cardiac Stents (DAPT)  Normal cardiovascular exam   Stress echocardiogram performed on 09/10/2021 revealing a normal left ventricular systolic function with an EF of >55%.  During the study, patient symptomatic and noted to be experiencing exertional chest pain.  There were 1-2 mm of inferolateral ST depression noted at maximum effort.  Left ventricular diastolic Doppler parameters consistent with abnormal relaxation (G1DD).  There was trivial to mild mitral, tricuspid, and pulmonary valve regurgitation.  All transvalvular gradients were noted to be normal providing no evidence suggestive of valvular stenosis.  Patient with overall good exercise tolerance.  Study determined to be normal overall.    Coronary CTA performed on 12/19/2021 revealed an elevated calcium score of 220, which was the 48 percentile for age and sex matched control.  Study suggestive of greater than 70% stenosis and the proximal LAD territory.  FFR analysis was 0.69 (normal range >0.80) suggestive of significant vessel stenosis.    Diagnostic LEFT heart catheterization was performed on 12/26/2021 revealing multivessel CAD; 40% proximal and mid RCA and 90% proximal and mid LAD.  Subsequent PCI was performed placing a 2.75 x 26 mm Onyx Frontier DES x 1 to the mid LAD yielding excellent angiographic result and TIMI-3 flow.    Neuro/Psych negative  neurological ROS  negative psych ROS   GI/Hepatic negative GI ROS, Neg liver ROS,,,  Endo/Other  Pituitary adenoma    Renal/GU negative Renal ROS   BPH     Musculoskeletal  (+) Arthritis ,    Abdominal   Peds  Hematology negative hematology ROS (+)   Anesthesia Other Findings Past Medical History: No date: Allergy No date: BPH (benign prostatic hyperplasia) 12/19/2021: Coronary artery disease     Comment:  a.) cCTA 12/19/2021: Ca score 220 (66th percentile for               age/sex match control); >70% pLAD territory (FFR 0.69);               b.) LHC/PCI 12/26/2021: 40% p-mRCA, 90% p-mLAD (2.75 x 26              mm Onxy Frontier DES x 1) 26/83/4196: Diastolic dysfunction     Comment:  a.) stress TTE 09/10/2021: EF> 55%, triv-mild MR/TR/PR,               G1DD. 01/17/2020: Dysplastic nevus     Comment:  Left mid side. Severe atypia with scar, close to margin.              exc 03/14/2020 01/17/2020: Dysplastic nevus     Comment:  Left calf. Moderate atypia, deep margin involved.  01/17/2020: Dysplastic nevus     Comment:  LUQ abdomen. Mild atypia, limited margins free.  No date: Hepatitis C antibody test positive No date: Hyperlipidemia No date: Hypertension No date: Liver nodule No date: Long term current use of antithrombotics/antiplatelets     Comment:  a.) on DAPT (ASA +  clopidogrel) No date: Lung nodule No date: Nontraumatic rotator cuff tear, right No date: Pituitary adenoma ([place]) No date: Pre-diabetes No date: Thyroid nodule No date: Typical angina [facility])  Past Surgical History: No date: CATARACT EXTRACTION; Bilateral 04/13/2019: COLONOSCOPY WITH PROPOFOL; N/A     Comment:  Procedure: COLONOSCOPY WITH PROPOFOL;  Surgeon: Lucilla Lame, MD;  Location: [facility] ENDOSCOPY;  Service:               Endoscopy;  Laterality: N/A; 12/26/2021: CORONARY STENT INTERVENTION; N/A     Comment:  Procedure: CORONARY STENT INTERVENTION;  Surgeon: Andrez Grime, MD;  Location: [place] [facility];                Service: Cardiovascular;  Laterality: N/A; No date: CYSTOSCOPY WITH INSERTION OF UROLIFT 11/29/2019: CYSTOSCOPY WITH INSERTION OF UROLIFT; N/A     Comment:  Procedure: CYSTOSCOPY WITH INSERTION OF UROLIFT;                Surgeon: Hollice Espy, MD;  Location: [facility] ORS;                Service: Urology;  Laterality: N/A; 11/29/2019: HOLEP-LASER ENUCLEATION OF THE PROSTATE WITH  MORCELLATION; N/A     Comment:  Procedure: HOLEP-LASER ENUCLEATION OF THE PROSTATE;                Surgeon: Hollice Espy, MD;  Location: [facility] ORS;                Service: Urology;  Laterality: N/A; 12/26/2021: INTRAVASCULAR ULTRASOUND/IVUS; N/A     Comment:  Procedure: Intravascular Ultrasound/IVUS;  Surgeon:               Andrez Grime, MD;  Location: [place] [facility];              Service: Cardiovascular;  Laterality: N/A; 2008: KNEE ARTHROSCOPY 12/26/2021: LEFT HEART CATH AND CORONARY ANGIOGRAPHY; N/A     Comment:  Procedure: LEFT HEART CATH AND CORONARY ANGIOGRAPHY;                Surgeon: Andrez Grime, MD;  Location: [place] [facility];  Service: Cardiovascular;  Laterality:               N/A; No date: PITUITARY EXCISION No date: TONSILLECTOMY  BMI    Body Mass Index: 26.08 kg/m      Reproductive/Obstetrics negative OB ROS                             Anesthesia Physical Anesthesia Plan  ASA: 3  Anesthesia Plan: General ETT   Post-op Pain Management: Regional block*   Induction: Intravenous  PONV Risk Score and Plan: 2 and Ondansetron, Dexamethasone, Midazolam and Treatment may vary due to age or medical condition  Airway Management Planned: Oral ETT  Additional Equipment:   Intra-op Plan:   Post-operative Plan: Extubation in OR  Informed Consent: I have reviewed the patients History and Physical, chart, labs and discussed the procedure including the risks,  benefits and alternatives for the proposed anesthesia with the patient or authorized representative who has indicated his/her understanding and acceptance.     Dental Advisory Given  Plan  Discussed with: Anesthesiologist, CRNA and Surgeon  Anesthesia Plan Comments: (Patient consented for risks of anesthesia including but not limited to:  - adverse reactions to medications - damage to eyes, teeth, lips or other oral mucosa - nerve damage due to positioning  - sore throat or hoarseness - Damage to heart, brain, nerves, lungs, other parts of body or loss of life  Patient voiced understanding.)        Anesthesia Quick Evaluation

## 2022-07-22 NOTE — Discharge Instructions (Addendum)
Orthopedic discharge instructions: Patient has had a right shoulder rotator cuff repair Patient should remain in the sling, non-weightbearing on the operative extremity until follow-up.  It is okay to remove the sling for gentle elbow range of motion and pendulum exercises. Physical therapy will start next week as scheduled Dressing changes starting on postoperative day #3 as needed DVT prophylaxis includes aspirin and Plavix as recommended by patient's PCP Follow up with Dr. Minda Meo PA, Murilo Zanette [name] at [employer], [place] office in 10-14 days for a clinical checkup.  AMBULATORY SURGERY  DISCHARGE INSTRUCTIONS   The drugs that you were given will stay in your system until tomorrow so for the next 24 hours you should not:  Drive an automobile Make any legal decisions Drink any alcoholic beverage   You may resume regular meals tomorrow.  Today it is better to start with liquids and gradually work up to solid foods.  You may eat anything you prefer, but it is better to start with liquids, then soup and crackers, and gradually work up to solid foods.   Please notify your doctor immediately if you have any unusual bleeding, trouble breathing, redness and pain at the surgery site, drainage, fever, or pain not relieved by medication.    Additional Instructions:   PLEASE LEAVE GREEN ARMBAND ON FOR 4 DAYS   Please contact your physician with any problems or Same Day Surgery at [phone], Monday through Friday 6 am to 4 pm, or [place] at [facility] number at [phone].  SHOULDER SLING IMMOBILIZER   VIDEO Slingshot 2 Shoulder Brace Application - YouTube ---[email or web address]  INSTRUCTIONS While supporting the injured arm, slide the forearm into the sling. Wrap the adjustable shoulder strap around the neck and shoulders and attach the strap end to the sling using  the "alligator strap tab."  Adjust the shoulder strap  to the required length. Position the shoulder pad behind the neck. To secure the shoulder pad location (optional), pull the shoulder strap away from the shoulder pad, unfold the hook material on the top of the pad, then press the shoulder strap back onto the hook material to secure the pad in place. Attach the closure strap across the open top of the sling. Position the strap so that it holds the arm securely in the sling. Next, attach the thumb strap to the open end of the sling between the thumb and fingers. After sling has been fit, it may be easily removed and reapplied using the quick release buckle on shoulder strap. If a neutral pillow or 15 abduction pillow is included, place the pillow at the waistline. Attach the sling to the pillow, lining up hook material on the pillow with the loop on sling. Adjust the waist strap to fit.  If waist strap is too long, cut it to fit. Use the small piece of double sided hook material (located on top of the pillow) to secure the strap end. Place the double sided hook material on the inside of the cut strap end and secure it to the waist strap.     If no pillow is included, attach the waist strap to the sling and adjust to fit.    Washing Instructions: Straps and sling must be removed and cleaned regularly depending on your activity level and perspiration. Hand wash straps and sling in cold water with mild detergent, rinse, air dry        Interscalene Nerve Block with Exparel   For your surgery you have received  an Interscalene Nerve Block with Exparel. Nerve Blocks affect many types of nerves, including nerves that control movement, pain and normal sensation.  You may experience feelings such as numbness, tingling, heaviness, weakness or the inability to move your arm or the feeling or sensation that your arm has "fallen asleep". A nerve block with Exparel can last up to 5 days.  Usually the weakness wears off first.  The tingling and heaviness usually  wear off next.  Finally you may start to notice pain.  Keep in mind that this may occur in any order.  Once a nerve block starts to wear off it is usually completely gone within 60 minutes. ISNB may cause mild shortness of breath, a hoarse voice, blurry vision, unequal pupils, or drooping of the face on the same side as the nerve block.  These symptoms will usually resolve with the numbness.  Very rarely the procedure itself can cause mild seizures. If needed, your surgeon will give you a prescription for pain medication.  It will take about 60 minutes for the oral pain medication to become fully effective.  So, it is recommended that you start taking this medication before the nerve block first begins to wear off, or when you first begin to feel discomfort. Take your pain medication only as prescribed.  Pain medication can cause sedation and decrease your breathing if you take more than you need for the level of pain that you have. Nausea is a common side effect of many pain medications.  You may want to eat something before taking your pain medicine to prevent nausea. After an Interscalene nerve block, you cannot feel pain, pressure or extremes in temperature in the effected arm.  Because your arm is numb it is at an increased risk for injury.  To decrease the possibility of injury, please practice the following:  While you are awake change the position of your arm frequently to prevent too much pressure on any one area for prolonged periods of time.  If you have a cast or tight dressing, check the color or your fingers every couple of hours.  Call your surgeon with the appearance of any discoloration (white or blue). If you are given a sling to wear before you go home, please wear it  at all times until the block has completely worn off.  Do not get up at night without your sling. Please contact [place] [facility] or your surgeon if you do not begin to regain sensation after 7 days from the surgery.   Anesthesia may be contacted by calling the Same Day Surgery Department, Mon. through Fri., 6 am to 4 pm at [phone].   If you experience any other problems or concerns, please contact your surgeon's office. If you experience severe or prolonged shortness of breath go to the nearest emergency department.

## 2022-07-22 NOTE — Anesthesia Procedure Notes (Signed)
Procedure Name: Intubation Date/Time: 07/22/2022 7:48 AM  Performed by: Lia Foyer, CRNAPre-anesthesia Checklist: Patient identified, Emergency Drugs available, Suction available and Patient being monitored Patient Re-evaluated:Patient Re-evaluated prior to induction Oxygen Delivery Method: Circle system utilized Preoxygenation: Pre-oxygenation with 100% oxygen Induction Type: IV induction Ventilation: Mask ventilation without difficulty Laryngoscope Size: McGraph and 4 Grade View: Grade I Tube type: Oral Tube size: 7.0 mm Number of attempts: 1 Airway Equipment and Method: Stylet, Oral airway and Video-laryngoscopy Placement Confirmation: ETT inserted through vocal cords under direct vision, positive ETCO2 and breath sounds checked- equal and bilateral Secured at: 23 cm Tube secured with: Tape Dental Injury: Teeth and Oropharynx as per pre-operative assessment

## 2022-07-22 NOTE — Anesthesia Procedure Notes (Signed)
Anesthesia Regional Block: Interscalene brachial plexus block   Pre-Anesthetic Checklist: , timeout performed,  Correct Patient, Correct Site, Correct Laterality,  Correct Procedure, Correct Position, site marked,  Risks and benefits discussed,  Surgical consent,  Pre-op evaluation,  At surgeon's request and post-op pain management  Laterality: Right  Prep: alcohol swabs       Needles:  Injection technique: Single-shot  Needle Type: Echogenic Needle     Needle Length: 4cm  Needle Gauge: 25     Additional Needles:   Procedures:,,,, ultrasound used (permanent image in chart),,    Narrative:  Start time: 07/22/2022 7:33 AM End time: 07/22/2022 7:37 AM Injection made incrementally with aspirations every 5 mL.  Performed by: Personally  Anesthesiologist: Ilene Qua, MD  Additional Notes: Patient's chart reviewed and they were deemed appropriate candidate for procedure, at surgeon's request. Patient educated about risks, benefits, and alternatives of the block including but not limited to: temporary or permanent nerve damage, bleeding, infection, damage to surround tissues, pneumothorax, hemidiaphragmatic paralysis, unilateral Horner's syndrome, block failure, local anesthetic toxicity. Patient expressed understanding. A formal time-out was conducted consistent with institution rules.  Monitors were applied, and minimal sedation used (see nursing record). The site was prepped with skin prep and allowed to dry, and sterile gloves were used. A high frequency linear ultrasound probe with probe cover was utilized throughout. C5-7 nerve roots located and appeared anatomically normal, local anesthetic injected around them, and echogenic block needle trajectory was monitored throughout. Aspiration performed every 71m. Lung and blood vessels were avoided. All injections were performed without resistance and free of blood and paresthesias. The patient tolerated the procedure well.  Injectate:  144mexparel + 1022m.5% bupivacaine

## 2022-07-24 ENCOUNTER — Encounter: Payer: Self-pay | Admitting: Orthopaedic Surgery

## 2022-07-25 NOTE — Op Note (Signed)
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  Intra-articular long head biceps tendinopathy with instability of the biceps labral anchor 3.  AC joint arthropathy 4.  Subacromial bursitis with undersurface burring of the acromion 5.  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  Synovitis within the rotator interval, posterior and superior capsule 7.   Intact subscapularis 8.  Superior and anterior labral fraying  PROCEDURE:  Right shoulder arthroscopy with double row repair of supraspinatus and infraspinatus tear using a speed bridge technique, mini open subpectoralis biceps tenodesis, subacromial decompression with partial acromioplasty, arthroscopic distal clavicle excision, extensive debridement of synovitis within the rotator interval, superior and anterior labral fraying, glenoid chondromalacia   PREOPERATIVE INDICATIONS:     Brett Wilson  is an 67 y.o. year old with chronic persistent right shoulder pain after a fall in summer 2023.  Advanced imaging showed presence of a full-thickness irregular tear of the supraspinatus as well as AC joint arthropathy and long head biceps tendinopathy.  Patient underwent recent stent placement after heart attack and per his cardiologist was unable to have surgery until recently cleared.  Cardiology recommendations were for patient to have surgery at [facility] given proximity to his medical care.  Long discussion was had with the patient regarding operative and nonoperative treatment options and risks and benefits of each.  He understands there is a  risk of re-tear, reinjury, need for further surgery, worsening arthritis, stiffness, numbness, neurovascular injury, blood clot, etc. Medical risks include but are not limited to DVT and pulmonary embolism, myocardial infarction, stroke, pneumonia, respiratory failure and death.  Consent was provided   OPERATIVE REPORT     SURGEON:  Renee Harder    ANESTHESIA: General with single shot nerve block for postoperative pain relief  EBL:  737 cc    COMPLICATIONS:  None.     PROCEDURE IN DETAIL:    The patient was met in the holding area and  identified.  He was taken to the OR and induced with a light general anesthetic.  Patient was placed in the beachchair position with bony prominences well-padded.  Timeout was taken and preoperative antibiotics were administered.  Standard posterior lateral portal was made.  Arthroscope was inserted and the joint was insufflated with normal saline.  See diagnostic arthroscopy findings above.  Rotator interval portal was made.  Synovitis within the rotator interval was debrided with a shaver and radiofrequency wand.  Long head biceps was tenotomized in preparation for later tenodesis.  There was significant chondral fraying within the central glenoid with an unstable loose flap of cartilage which was gently debrided.  The articular side of the supraspinatus and infraspinatus was all so gently debrided from within the joint.  Subscapularis was noted to be intact.  Anterior and superior labral fraying was also gently debrided.  Mini open subpectoralis biceps tenodesis: 2 cm incision was made just lateral to the axillary fold.  Dissection was carried to the bicipital groove.  Tendon was removed and the groove was decorticated.  Double looped hybrid knee anchor was placed.  Biceps tendon was passed through both loops which were sequentially tensioned.  Tendon was  folded on itself and suture was passed back through the tendon and tied to lock the construct.  This  provided nice secure onlay tenodesis.  Subacromial decompression, partial acromioplasty, arthroscopic distal clavicle excision, rotator cuff repair: Arthroscope was moved to the subacromial space.  Anterolateral and posterior lateral subacromial portals were made.  There were thick adhesions and hemorrhage within the subacromial bursa.  Decompression was performed with a shaver and radiofrequency wand.  Given the patient's anticoagulation status, the tissues were quite friable.  There was undersurface spurring of the acromion laterally as well as spurring at the [facility] joint.  5 mm bur was utilized to perform a partial acromioplasty to flatten the acromion and reduce further impingement episodes.  8 to 10 mm of space was created at the level of the distal clavicle to remove the arthropathy at the [facility] joint.  Attention was then turned to the rotator cuff tear.  This was gently debrided.  Footprint was debrided and decorticated in preparation for repair.  One 2.6 mm fiber tack RC anchor was placed medially.  An additional anchor was placed medially however it did not have good purchase due to poor bone quality.  A 4.75 mm swivel lock anchor with fiber tape was placed.  Scorpion device was utilized to pass suture tape and fiber tape from posterior to anterior.  Additional suture tape was placed at the most anterior and posterior margins of the tear.  Sutures were crisscrossed and brought down laterally due to 4.75 mm swivel lock anchors.  The initial posterior anchor pulled through the bone due to poor bone quality so it was replaced more distal/lateral with appropriate purchase.  The anterior lateral anchor was placed with appropriate fixation.  Shoulder was taken through range of motion and the repair was noted to be stable.  Final pictures were obtained.   Wounds were irrigated.  They are closed in layered fashion with 2-0 Vicryl and 3-0 Monocryl in the biceps tenodesis incision and 3-0 nylon in the arthroscopic  portals.  Patient was awoken from anesthesia and taken to the PACU in stable condition.    Postoperative plan:  Patient will follow the rotator cuff repair protocol.  He will be in a sling for 6 weeks postoperatively.  He will start physical therapy to work on gentle shoulder range of motion within the next week.  He will follow-up in clinic in approximately 2 weeks with 2 view x-rays of the right shoulder.  He will resume Plavix on postop day 1 per cardiology recommendations.    Renee Harder, MD Orthopedic Surgeon

## 2022-07-31 ENCOUNTER — Encounter: Payer: Self-pay | Admitting: Physician Assistant

## 2022-07-31 ENCOUNTER — Other Ambulatory Visit: Payer: Self-pay | Admitting: Physician Assistant

## 2022-07-31 DIAGNOSIS — G4709 Other insomnia: Secondary | ICD-10-CM

## 2022-07-31 MED ORDER — ZOLPIDEM TARTRATE 5 MG PO TABS: 5.0000 mg | ORAL_TABLET | Freq: Every evening | ORAL | 1 refills | Status: DC | PRN

## 2022-08-01 DIAGNOSIS — M25511 Pain in right shoulder: Secondary | ICD-10-CM | POA: Insufficient documentation

## 2022-08-07 ENCOUNTER — Telehealth: Payer: Self-pay | Admitting: Physician Assistant

## 2022-08-07 NOTE — Telephone Encounter (Signed)
Called patient to schedule Medicare Annual Wellness Visit (AWV). No voicemail available to leave a message.  Last date of AWV: 09/03/2021  Please schedule an appointment at any time with [facility].  If any questions, please contact me at [phone].  Thank you ,  [place] Direct Dial: [phone]

## 2022-09-03 ENCOUNTER — Telehealth: Payer: Self-pay | Admitting: Physician Assistant

## 2022-09-03 NOTE — Telephone Encounter (Signed)
Contacted Brett Wilson to schedule their annual wellness visit. Appointment made for 09/10/2022.  [place] Direct Dial: [phone]

## 2022-09-09 NOTE — Progress Notes (Unsigned)
Annual Wellness Visit     Patient: Brett Wilson, Male    DOB: [birth year], 67 y.o.   MRN: [id number] Visit Date: 09/10/2022  Today's Provider: [facility]-ANNUAL WELLNESS VISIT   No chief complaint on file.  Subjective    [name] is a 67 y.o. male who presents today for his Annual Wellness Visit. He reports consuming a {diet types:17450} diet. {Exercise:19826} He generally feels {well/fairly well/poorly:18703}. He reports sleeping {well/fairly well/poorly:18703}. He {does/does not:200015} have additional problems to discuss today.   HPI    Medications: Outpatient Medications Prior to Visit  Medication Sig   aspirin EC 81 MG tablet Take 81 mg by mouth daily. Swallow whole.   clopidogrel (PLAVIX) 75 MG tablet Take 1 tablet (75 mg total) by mouth daily. (Patient taking differently: Take 75 mg by mouth every morning.)   finasteride (PROSCAR) 5 MG tablet Take 1 tablet (5 mg total) by mouth at bedtime. (Patient taking differently: Take 5 mg by mouth every other day. AM)   losartan (COZAAR) 100 MG tablet TAKE 1 TABLET BY MOUTH EVERY DAY (Patient taking differently: Take 100 mg by mouth every morning.)   metoprolol succinate (TOPROL-XL) 25 MG 24 hr tablet Take 25 mg by mouth every morning.   Multiple Vitamin (MULTIVITAMIN WITH MINERALS) TABS tablet Take 1 tablet by mouth daily.   rosuvastatin (CRESTOR) 40 MG tablet Take 1 tablet (40 mg total) by mouth daily. (Patient taking differently: Take 40 mg by mouth at bedtime.)   traMADol (ULTRAM) 50 MG tablet Take 50 mg by mouth every 6 (six) hours as needed.   Zinc 50 MG TABS Take 50 mg by mouth daily.   zolpidem (AMBIEN) 5 MG tablet Take 1 tablet (5 mg total) by mouth at bedtime as needed for sleep.   No facility-administered medications prior to visit.    Allergies  Allergen Reactions   Penicillins Hives    Patient Care Team: [name], [name] as PCP - General (Physician Assistant)  Review of Systems  {Labs  Heme  Chem   Endocrine  Serology  Results Review (optional):23779}    Objective    Vitals: There were no vitals taken for this visit. {Show previous vital signs (optional):23777}   Physical Exam ***  Most recent functional status assessment:    09/07/2022    9:43 AM  In your present state of health, do you have any difficulty performing the following activities:  Hearing? 0  Vision? 0  Difficulty concentrating or making decisions? 0  Dressing or bathing? 0  Doing errands, shopping? 0  Preparing Food and eating ? N  Using the Toilet? N  In the past six months, have you accidently leaked urine? N  Do you have problems with loss of bowel control? N  Managing your Medications? N  Managing your Finances? N  Housekeeping or managing your Housekeeping? N   Most recent fall risk assessment:    09/07/2022    9:43 AM  Fall Risk   Falls in the past year? 0  Number falls in past yr: 0  Injury with Fall? 0    Most recent depression screenings:    04/18/2022   10:43 AM 03/21/2022    9:38 AM  PHQ 2/9 Scores  PHQ - 2 Score 0 0  PHQ- 9 Score 0 0   Most recent cognitive screening:     No data to display         Most recent Audit-C alcohol use screening    09/07/2022  9:43 AM  Alcohol Use Disorder Test (AUDIT)  1. How often do you have a drink containing alcohol? 1  2. How many drinks containing alcohol do you have on a typical day when you are drinking? 0  3. How often do you have six or more drinks on one occasion? 0  AUDIT-C Score 1   A score of 3 or more in women, and 4 or more in men indicates increased risk for alcohol abuse, EXCEPT if all of the points are from question 1   No results found for any visits on 09/10/22.  Assessment & Plan     Annual wellness visit done today including the all of the following: Reviewed patient's Family Medical History Reviewed and updated list of patient's medical providers Assessment of cognitive impairment was done Assessed patient's  functional ability Established a written schedule for health screening services Health Risk Assessent Completed and Reviewed  Exercise Activities and Dietary recommendations  Goals   None     Immunization History  Administered Date(s) Administered   Fluad Quad(high Dose 65+) 03/22/2021   Influenza,inj,Quad PF,6+ Mos 02/24/2019   Influenza-Unspecified 03/31/2020   PFIZER Comirnaty(Gray Top)Covid-19 Tri-Sucrose Vaccine 09/06/2019, 09/27/2019   PNEUMOCOCCAL CONJUGATE-20 09/05/2021   Pfizer Covid-19 Vaccine Bivalent Booster 44yrs & up 11/22/2020, 04/13/2021   Tdap 10/19/2019   Zoster Recombinat (Shingrix) 06/02/2019, 10/19/2019    Health Maintenance  Topic Date Due   COVID-19 Vaccine (5 - 2023-24 season) 02/15/2022   Medicare Annual Wellness (AWV)  09/06/2022   INFLUENZA VACCINE  09/15/2022 (Originally 01/15/2022)   COLONOSCOPY (Pts 45-39yrs Insurance coverage will need to be confirmed)  04/12/2024   DTaP/Tdap/Td (2 - Td or Tdap) 10/18/2029   Pneumonia Vaccine 9+ Years old  Completed   Hepatitis C Screening  Completed   Zoster Vaccines- Shingrix  Completed   HPV VACCINES  Aged Out     Discussed health benefits of physical activity, and encouraged him to engage in regular exercise appropriate for his age and condition.    ***  No follow-ups on file.     {provider attestation***:1}   [facility]-ANNUAL WELLNESS VISIT  [facility] [phone] (phone) [phone] (fax)  [facility]

## 2022-09-10 ENCOUNTER — Ambulatory Visit (INDEPENDENT_AMBULATORY_CARE_PROVIDER_SITE_OTHER): Payer: Medicare HMO

## 2022-09-10 VITALS — BP 130/78 | Ht 67.0 in | Wt 171.7 lb

## 2022-09-10 DIAGNOSIS — Z0000 Encounter for general adult medical examination without abnormal findings: Secondary | ICD-10-CM

## 2022-09-10 NOTE — Patient Instructions (Addendum)
Brett Wilson , Thank you for taking time to come for your Medicare Wellness Visit. I appreciate your ongoing commitment to your health goals. Please review the following plan we discussed and let me know if I can assist you in the future.   These are the goals we discussed:  Goals   None     This is a list of the screening recommended for you and due dates:  Health Maintenance  Topic Date Due   COVID-19 Vaccine (5 - 2023-24 season) 02/15/2022   Flu Shot  09/15/2022*   Medicare Annual Wellness Visit  09/10/2023   Colon Cancer Screening  04/12/2024   DTaP/Tdap/Td vaccine (2 - Td or Tdap) 10/18/2029   Pneumonia Vaccine  Completed   Hepatitis C Screening: USPSTF Recommendation to screen - Ages 46-79 yo.  Completed   Zoster (Shingles) Vaccine  Completed   HPV Vaccine  Aged Out  *Topic was postponed. The date shown is not the original due date.    Advanced directives: yes  Conditions/risks identified: none  Next appointment: Follow up in one year for your annual wellness visit. 09/16/2023 @ 9:15 am in person  Preventive Care 65 Years and Older, Male  Preventive care refers to lifestyle choices and visits with your health care provider that can promote health and wellness. What does preventive care include? A yearly physical exam. This is also called an annual well check. Dental exams once or twice a year. Routine eye exams. Ask your health care provider how often you should have your eyes checked. Personal lifestyle choices, including: Daily care of your teeth and gums. Regular physical activity. Eating a healthy diet. Avoiding tobacco and drug use. Limiting alcohol use. Practicing safe sex. Taking low doses of aspirin every day. Taking vitamin and mineral supplements as recommended by your health care provider. What happens during an annual well check? The services and screenings done by your health care provider during your annual well check will depend on your age, overall  health, lifestyle risk factors, and family history of disease. Counseling  Your health care provider may ask you questions about your: Alcohol use. Tobacco use. Drug use. Emotional well-being. Home and relationship well-being. Sexual activity. Eating habits. History of falls. Memory and ability to understand (cognition). Work and work Statistician. Screening  You may have the following tests or measurements: Height, weight, and BMI. Blood pressure. Lipid and cholesterol levels. These may be checked every 5 years, or more frequently if you are over 56 years old. Skin check. Lung cancer screening. You may have this screening every year starting at age 28 if you have a 30-pack-year history of smoking and currently smoke or have quit within the past 15 years. Fecal occult blood test (FOBT) of the stool. You may have this test every year starting at age 25. Flexible sigmoidoscopy or colonoscopy. You may have a sigmoidoscopy every 5 years or a colonoscopy every 10 years starting at age 59. Prostate cancer screening. Recommendations will vary depending on your family history and other risks. Hepatitis C blood test. Hepatitis B blood test. Sexually transmitted disease (STD) testing. Diabetes screening. This is done by checking your blood sugar (glucose) after you have not eaten for a while (fasting). You may have this done every 1-3 years. Abdominal aortic aneurysm (AAA) screening. You may need this if you are a current or former smoker. Osteoporosis. You may be screened starting at age 79 if you are at high risk. Talk with your health care provider about your  test results, treatment options, and if necessary, the need for more tests. Vaccines  Your health care provider may recommend certain vaccines, such as: Influenza vaccine. This is recommended every year. Tetanus, diphtheria, and acellular pertussis (Tdap, Td) vaccine. You may need a Td booster every 10 years. Zoster vaccine. You may  need this after age 64. Pneumococcal 13-valent conjugate (PCV13) vaccine. One dose is recommended after age 75. Pneumococcal polysaccharide (PPSV23) vaccine. One dose is recommended after age 74. Talk to your health care provider about which screenings and vaccines you need and how often you need them. This information is not intended to replace advice given to you by your health care provider. Make sure you discuss any questions you have with your health care provider. Document Released: 06/30/2015 Document Revised: 02/21/2016 Document Reviewed: 04/04/2015 Elsevier Interactive Patient Education  2017 [place] Prevention in the Home Falls can cause injuries. They can happen to people of all ages. There are many things you can do to make your home safe and to help prevent falls. What can I do on the outside of my home? Regularly fix the edges of walkways and driveways and fix any cracks. Remove anything that might make you trip as you walk through a door, such as a raised step or threshold. Trim any bushes or trees on the path to your home. Use bright outdoor lighting. Clear any walking paths of anything that might make someone trip, such as rocks or tools. Regularly check to see if handrails are loose or broken. Make sure that both sides of any steps have handrails. Any raised decks and porches should have guardrails on the edges. Have any leaves, snow, or ice cleared regularly. Use sand or salt on walking paths during winter. Clean up any spills in your garage right away. This includes oil or grease spills. What can I do in the bathroom? Use night lights. Install grab bars by the toilet and in the tub and shower. Do not use towel bars as grab bars. Use non-skid mats or decals in the tub or shower. If you need to sit down in the shower, use a plastic, non-slip stool. Keep the floor dry. Clean up any water that spills on the floor as soon as it happens. Remove soap buildup in  the tub or shower regularly. Attach bath mats securely with double-sided non-slip rug tape. Do not have throw rugs and other things on the floor that can make you trip. What can I do in the bedroom? Use night lights. Make sure that you have a light by your bed that is easy to reach. Do not use any sheets or blankets that are too big for your bed. They should not hang down onto the floor. Have a firm chair that has side arms. You can use this for support while you get dressed. Do not have throw rugs and other things on the floor that can make you trip. What can I do in the kitchen? Clean up any spills right away. Avoid walking on wet floors. Keep items that you use a lot in easy-to-reach places. If you need to reach something above you, use a strong step stool that has a grab bar. Keep electrical cords out of the way. Do not use floor polish or wax that makes floors slippery. If you must use wax, use non-skid floor wax. Do not have throw rugs and other things on the floor that can make you trip. What can I do with my  stairs? Do not leave any items on the stairs. Make sure that there are handrails on both sides of the stairs and use them. Fix handrails that are broken or loose. Make sure that handrails are as long as the stairways. Check any carpeting to make sure that it is firmly attached to the stairs. Fix any carpet that is loose or worn. Avoid having throw rugs at the top or bottom of the stairs. If you do have throw rugs, attach them to the floor with carpet tape. Make sure that you have a light switch at the top of the stairs and the bottom of the stairs. If you do not have them, ask someone to add them for you. What else can I do to help prevent falls? Wear shoes that: Do not have high heels. Have rubber bottoms. Are comfortable and fit you well. Are closed at the toe. Do not wear sandals. If you use a stepladder: Make sure that it is fully opened. Do not climb a closed  stepladder. Make sure that both sides of the stepladder are locked into place. Ask someone to hold it for you, if possible. Clearly mark and make sure that you can see: Any grab bars or handrails. First and last steps. Where the edge of each step is. Use tools that help you move around (mobility aids) if they are needed. These include: Canes. Walkers. Scooters. Crutches. Turn on the lights when you go into a dark area. Replace any light bulbs as soon as they burn out. Set up your furniture so you have a clear path. Avoid moving your furniture around. If any of your floors are uneven, fix them. If there are any pets around you, be aware of where they are. Review your medicines with your doctor. Some medicines can make you feel dizzy. This can increase your chance of falling. Ask your doctor what other things that you can do to help prevent falls. This information is not intended to replace advice given to you by your health care provider. Make sure you discuss any questions you have with your health care provider. Document Released: 03/30/2009 Document Revised: 11/09/2015 Document Reviewed: 07/08/2014 Elsevier Interactive Patient Education  2017 [employer].

## 2022-09-10 NOTE — Progress Notes (Signed)
Subjective:   Brett Wilson is a 67 y.o. male who presents for Medicare Annual/Subsequent preventive examination.  Review of Systems    Cardiac Risk Factors include: advanced age (>50men, >31 women);male gender;hypertension;dyslipidemia    Objective:    Today's Vitals   09/10/22 1529  BP: 130/78  Weight: 171 lb 11.2 oz (77.9 kg)  Height: 5\' 7"  (1.702 m)   Body mass index is 26.89 kg/m.     09/10/2022    3:51 PM 07/22/2022    6:12 AM 07/10/2022   10:34 AM 01/04/2022   10:20 AM 12/26/2021    6:57 AM 11/29/2019    8:45 PM 11/29/2019    6:27 AM  Advanced Directives  Does Patient Have a Medical Advance Directive? Yes Yes Yes Yes Yes Yes Yes  Type of Corporate treasurer of [place];Living will  [place];Living will Living will;Healthcare Power of [place];Living will  Does patient want to make changes to medical advance directive?  No - Patient declined    Yes (ED - Information included in AVS) No - Patient declined  Copy of [place] in Chart?  No - copy requested     Yes - validated most recent copy scanned in chart (See row information)  Would patient like information on creating a medical advance directive?     No - Patient declined No - Guardian declined     Current Medications (verified) Outpatient Encounter Medications as of 09/10/2022  Medication Sig   aspirin EC 81 MG tablet Take 81 mg by mouth daily. Swallow whole.   clopidogrel (PLAVIX) 75 MG tablet Take 1 tablet (75 mg total) by mouth daily. (Patient taking differently: Take 75 mg by mouth every morning.)   finasteride (PROSCAR) 5 MG tablet Take 1 tablet (5 mg total) by mouth at bedtime. (Patient taking differently: Take 5 mg by mouth every other day. AM)   losartan (COZAAR) 100 MG tablet TAKE 1 TABLET BY MOUTH EVERY DAY (Patient taking differently: Take 100 mg by mouth every morning.)   metoprolol succinate (TOPROL-XL) 25 MG 24 hr tablet  Take 25 mg by mouth every morning.   Multiple Vitamin (MULTIVITAMIN WITH MINERALS) TABS tablet Take 1 tablet by mouth daily.   rosuvastatin (CRESTOR) 40 MG tablet Take 1 tablet (40 mg total) by mouth daily. (Patient taking differently: Take 40 mg by mouth at bedtime.)   traMADol (ULTRAM) 50 MG tablet Take 50 mg by mouth every 6 (six) hours as needed.   Zinc 50 MG TABS Take 50 mg by mouth daily.   zolpidem (AMBIEN) 5 MG tablet Take 1 tablet (5 mg total) by mouth at bedtime as needed for sleep.   No facility-administered encounter medications on file as of 09/10/2022.    Allergies (verified) Penicillins   History: Past Medical History:  Diagnosis Date   Allergy    Arthritis 03/1999   BPH (benign prostatic hyperplasia)    Coronary artery disease 12/19/2021   a.) cCTA 12/19/2021: Ca score 220 (66th percentile for age/sex match control); >70% pLAD territory (FFR 0.69); b.) LHC/PCI 12/26/2021: 40% p-mRCA, 90% p-mLAD (2.75 x 26 mm Onxy Frontier DES x 1)   Diastolic dysfunction 99991111   a.) stress TTE 09/10/2021: EF> 55%, triv-mild MR/TR/PR, G1DD.   Dysplastic nevus 01/17/2020   Left mid side. Severe atypia with scar, close to margin. exc 03/14/2020   Dysplastic nevus 01/17/2020   Left calf. Moderate atypia, deep margin involved.  Dysplastic nevus 01/17/2020   LUQ abdomen. Mild atypia, limited margins free.    Hepatitis C antibody test positive    Hyperlipidemia    Hypertension    Liver nodule    Long term current use of antithrombotics/antiplatelets    a.) on DAPT (ASA + clopidogrel)   Lung nodule    Nontraumatic rotator cuff tear, right    Pituitary adenoma (HCC)    Pre-diabetes    Thyroid nodule    Typical angina [facility])    Past Surgical History:  Procedure Laterality Date   CATARACT EXTRACTION Bilateral    COLONOSCOPY WITH PROPOFOL N/A 04/13/2019   Procedure: COLONOSCOPY WITH PROPOFOL;  Surgeon: Lucilla Lame, MD;  Location: [facility] ENDOSCOPY;  Service: Endoscopy;   Laterality: N/A;   CORONARY STENT INTERVENTION N/A 12/26/2021   Procedure: CORONARY STENT INTERVENTION;  Surgeon: Andrez Grime, MD;  Location: [place] [facility];  Service: Cardiovascular;  Laterality: N/A;   CYSTOSCOPY WITH INSERTION OF UROLIFT     CYSTOSCOPY WITH INSERTION OF UROLIFT N/A 11/29/2019   Procedure: CYSTOSCOPY WITH INSERTION OF UROLIFT;  Surgeon: Hollice Espy, MD;  Location: [facility] ORS;  Service: Urology;  Laterality: N/A;   EYE SURGERY  Cataract 2005   HOLEP-LASER ENUCLEATION OF THE PROSTATE WITH MORCELLATION N/A 11/29/2019   Procedure: HOLEP-LASER ENUCLEATION OF THE PROSTATE;  Surgeon: Hollice Espy, MD;  Location: [facility] ORS;  Service: Urology;  Laterality: N/A;   INTRAVASCULAR ULTRASOUND/IVUS N/A 12/26/2021   Procedure: Intravascular Ultrasound/IVUS;  Surgeon: Andrez Grime, MD;  Location: [place] [facility];  Service: Cardiovascular;  Laterality: N/A;   KNEE ARTHROSCOPY  2008   LEFT HEART CATH AND CORONARY ANGIOGRAPHY N/A 12/26/2021   Procedure: LEFT HEART CATH AND CORONARY ANGIOGRAPHY;  Surgeon: Andrez Grime, MD;  Location: [place] [facility];  Service: Cardiovascular;  Laterality: N/A;   PITUITARY EXCISION     SHOULDER ARTHROSCOPY Right 07/22/2022   Procedure: ARTHROSCOPY SHOULDER;  Surgeon: Renee Harder, MD;  Location: [facility] ORS;  Service: Orthopedics;  Laterality: Right;  SUBACROMIAL DECOMPRESSION, OPEN BICEPS TENODESIS   TONSILLECTOMY     Family History  Problem Relation Age of Onset   Hypertension Mother    Arthritis Mother    Colon cancer Father    Cancer Father    Social History   Socioeconomic History   Marital status: Married    Spouse name: Not on file   Number of children: Not on file   Years of education: Not on file   Highest education level: Not on file  Occupational History   Not on file  Tobacco Use   Smoking status: Never   Smokeless tobacco: Never  Vaping Use   Vaping Use: Never used  Substance and Sexual  Activity   Alcohol use: Not Currently   Drug use: Never   Sexual activity: Yes  Other Topics Concern   Not on file  Social History Narrative   Not on file   Social Determinants of Health   Financial Resource Strain: Low Risk  (09/07/2022)   Overall Financial Resource Strain (CARDIA)    Difficulty of Paying Living Expenses: Not hard at all  Food Insecurity: No Food Insecurity (09/07/2022)   Hunger Vital Sign    Worried About Running Out of Food in the Last Year: Never true    [place] in the Last Year: Never true  Transportation Needs: No Transportation Needs (09/07/2022)   PRAPARE - Hydrologist (Medical): No  Lack of Transportation (Non-Medical): No  Physical Activity: Insufficiently Active (09/07/2022)   Exercise Vital Sign    Days of Exercise per Week: 6 days    Minutes of Exercise per Session: 20 min  Stress: No Stress Concern Present (09/07/2022)   [place]    Feeling of Stress : Only a little  Social Connections: Unknown (09/07/2022)   Social Connection and Isolation Panel [NHANES]    Frequency of Communication with Friends and Family: Once a week    Frequency of Social Gatherings with Friends and Family: Twice a week    Attends Religious Services: Not on Advertising copywriter or Organizations: Yes    Attends Music therapist: More than 4 times per year    Marital Status: Married    Tobacco Counseling Counseling given: Not Answered   Clinical Intake:  Pre-visit preparation completed: Yes  Pain : No/denies pain     BMI - recorded: 26.89 Nutritional Status: BMI 25 -29 Overweight Nutritional Risks: None Diabetes: No  How often do you need to have someone help you when you read instructions, pamphlets, or other written materials from your doctor or pharmacy?: 1 - Never  Diabetic?no  Interpreter Needed?: No  Comments: lives with  wife Information entered by :: B.Json Koelzer,LPN   Activities of Daily Living    09/07/2022    9:43 AM 07/10/2022   10:15 AM  In your present state of health, do you have any difficulty performing the following activities:  Hearing? 0   Vision? 0   Difficulty concentrating or making decisions? 0   Dressing or bathing? 0   Doing errands, shopping? 0 0  Preparing Food and eating ? N   Using the Toilet? N   In the past six months, have you accidently leaked urine? N   Do you have problems with loss of bowel control? N   Managing your Medications? N   Managing your Finances? N   Housekeeping or managing your Housekeeping? N     Patient Care Team: [name], [name] as PCP - General (Physician Assistant)  Indicate any recent Medical Services you may have received from other than [facility] providers in the past year (date may be approximate).     Assessment:   This is a routine wellness examination for Brett Wilson.  Hearing/Vision screen Hearing Screening - Comments:: Adequate hearing Vision Screening - Comments:: Adequate vision w/glasses [facility]  Dietary issues and exercise activities discussed: Current Exercise Habits: Home exercise routine, Type of exercise: walking, Time (Minutes): 30, Frequency (Times/Week): 6, Weekly Exercise (Minutes/Week): 180, Intensity: Mild, Exercise limited by: orthopedic condition(s)   Goals Addressed   None    Depression Screen    09/10/2022    3:41 PM 04/18/2022   10:43 AM 03/21/2022    9:38 AM 02/20/2022    1:22 PM 02/07/2022    3:44 PM 01/21/2022    2:03 PM 09/05/2021    9:53 AM  PHQ 2/9 Scores  PHQ - 2 Score 0 0 0 0 0 0 0  PHQ- 9 Score  0 0 2 0 0 0    Fall Risk    09/07/2022    9:43 AM 04/18/2022   10:43 AM 03/21/2022    9:38 AM 02/07/2022    3:43 PM 01/04/2022   10:06 AM  Fall Risk   Falls in the past year? 0 0 0 0 0  Number falls in past yr: 0  0 0 0   Injury with Fall? 0 0 0 0   Risk for fall due to :  No Fall Risks No Fall  Risks No Fall Risks Medication side effect  Follow up  Falls evaluation completed Falls evaluation completed Falls evaluation completed Education provided;Falls prevention discussed    FALL RISK PREVENTION PERTAINING TO THE HOME:  Any stairs in or around the home? Yes  If so, are there any without handrails? Yes  Home free of loose throw rugs in walkways, pet beds, electrical cords, etc? Yes  Adequate lighting in your home to reduce risk of falls? Yes   ASSISTIVE DEVICES UTILIZED TO PREVENT FALLS:  Life alert? No  Use of a cane, walker or w/c? No  Grab bars in the bathroom? No  Shower chair or bench in shower? No  Elevated toilet seat or a handicapped toilet? Yes   TIMED UP AND GO:  Was the test performed? Yes .  Length of time to ambulate 10 feet: 8 sec.   Gait steady and fast without use of assistive device  Cognitive Function:        09/10/2022    3:43 PM  6CIT Screen  What Year? 0 points  What month? 0 points  What time? 0 points  Count back from 20 0 points  Months in reverse 0 points  Repeat phrase 0 points  Total Score 0 points    Immunizations Immunization History  Administered Date(s) Administered   Fluad Quad(high Dose 65+) 03/22/2021   Influenza,inj,Quad PF,6+ Mos 02/24/2019   Influenza-Unspecified 03/31/2020   PFIZER Comirnaty(Gray Top)Covid-19 Tri-Sucrose Vaccine 09/06/2019, 09/27/2019   PNEUMOCOCCAL CONJUGATE-20 09/05/2021   Pfizer Covid-19 Vaccine Bivalent Booster 70yrs & up 11/22/2020, 04/13/2021   Tdap 10/19/2019   Zoster Recombinat (Shingrix) 06/02/2019, 10/19/2019    TDAP status: Up to date  Flu Vaccine status: YES  Pneumococcal vaccine status: Up to date  Covid-19 vaccine status: Declined, Education has been provided regarding the importance of this vaccine but patient still declined. Advised may receive this vaccine at local pharmacy or Health Dept.or vaccine clinic. Aware to provide a copy of the vaccination record if obtained from  local pharmacy or Health Dept. Verbalized acceptance and understanding.  Qualifies for Shingles Vaccine? Yes   Zostavax completed Yes   Shingrix Completed?: Yes  Screening Tests Health Maintenance  Topic Date Due   COVID-19 Vaccine (5 - 2023-24 season) 02/15/2022   INFLUENZA VACCINE  09/15/2022 (Originally 01/15/2022)   Medicare Annual Wellness (AWV)  09/10/2023   COLONOSCOPY (Pts 45-65yrs Insurance coverage will need to be confirmed)  04/12/2024   DTaP/Tdap/Td (2 - Td or Tdap) 10/18/2029   Pneumonia Vaccine 38+ Years old  Completed   Hepatitis C Screening  Completed   Zoster Vaccines- Shingrix  Completed   HPV VACCINES  Aged Out    Health Maintenance  Health Maintenance Due  Topic Date Due   COVID-19 Vaccine (5 - 2023-24 season) 02/15/2022    Colorectal cancer screening: Type of screening: Colonoscopy. Completed yes. Repeat every 5 years  Lung Cancer Screening: (Low Dose CT Chest recommended if Age 47-80 years, 30 pack-year currently smoking OR have quit w/in 15years.) does not qualify.   Lung Cancer Screening Referral: no  Additional Screening:  Hepatitis C Screening: does not qualify; Completed yes  Vision Screening: Recommended annual ophthalmology exams for early detection of glaucoma and other disorders of the eye. Is the patient up to date with their annual eye exam?  Yes  Who is the  provider or what is the name of the office in which the patient attends annual eye exams? Dr Herbert Deaner If pt is not established with a provider, would they like to be referred to a provider to establish care? No .   Dental Screening: Recommended annual dental exams for proper oral hygiene  Community Resource Referral / Chronic Care Management: CRR required this visit?  No   CCM required this visit?  No      Plan:     I have personally reviewed and noted the following in the patient's chart:   Medical and social history Use of alcohol, tobacco or illicit drugs  Current  medications and supplements including opioid prescriptions. Patient is not currently taking opioid prescriptions. Functional ability and status Nutritional status Physical activity Advanced directives List of other physicians Hospitalizations, surgeries, and ER visits in previous 12 months Vitals Screenings to include cognitive, depression, and falls Referrals and appointments  In addition, I have reviewed and discussed with patient certain preventive protocols, quality metrics, and best practice recommendations. A written personalized care plan for preventive services as well as general preventive health recommendations were provided to patient.     Roger Shelter, LPN   [id number]   Nurse Notes: pt is doing well :he has no concerns or questions during this visit. Pt made appt for AW w/PCP for next month.

## 2022-09-26 NOTE — Progress Notes (Signed)
I,Brett Wilson,acting as a scribe for [employer], [name].,have documented all relevant documentation on the behalf of Brett Ferguson, [name],as directed by  Brett Ferguson, [name] while in the presence of Brett Ferguson, [name].   Complete physical exam   Patient: Brett Wilson   DOB: May 29, 1956   67 y.o. Male  MRN: [id number] Visit Date: 09/27/2022  Today's healthcare provider: [name], [name]   Chief Complaint  Patient presents with   Annual Exam   Subjective    Brett Wilson is a 67 y.o. male who presents today for a complete physical exam.  He reports consuming a  healthy  diet. Exercises: reports doing 30 minutes on Treadmill for 5 times a week and takes 20 minutes walks daily and some biking He generally feels well. He reports sleeping well. He does not have additional problems to discuss today.    Past Medical History:  Diagnosis Date   Allergy    Arthritis 03/1999   BPH (benign prostatic hyperplasia)    Coronary artery disease 12/19/2021   a.) cCTA 12/19/2021: Ca score 220 (66th percentile for age/sex match control); >70% pLAD territory (FFR 0.69); b.) LHC/PCI 12/26/2021: 40% p-mRCA, 90% p-mLAD (2.75 x 26 mm Onxy Frontier DES x 1)   Diastolic dysfunction 09/10/2021   a.) stress TTE 09/10/2021: EF> 55%, triv-mild MR/TR/PR, G1DD.   Dysplastic nevus 01/17/2020   Left mid side. Severe atypia with scar, close to margin. exc 03/14/2020   Dysplastic nevus 01/17/2020   Left calf. Moderate atypia, deep margin involved.    Dysplastic nevus 01/17/2020   LUQ abdomen. Mild atypia, limited margins free.    Hepatitis C antibody test positive    Hyperlipidemia    Hypertension    Liver nodule    Long term current use of antithrombotics/antiplatelets    a.) on DAPT (ASA + clopidogrel)   Lung nodule    Nontraumatic rotator cuff tear, right    Pituitary adenoma    Pre-diabetes    Thyroid nodule    Typical angina    Past Surgical History:  Procedure Laterality Date    CATARACT EXTRACTION Bilateral    COLONOSCOPY WITH PROPOFOL N/A 04/13/2019   Procedure: COLONOSCOPY WITH PROPOFOL;  Surgeon: Midge Minium, MD;  Location: [facility] ENDOSCOPY;  Service: Endoscopy;  Laterality: N/A;   CORONARY STENT INTERVENTION N/A 12/26/2021   Procedure: CORONARY STENT INTERVENTION;  Surgeon: Armando Reichert, MD;  Location: [facility] INVASIVE CV LAB;  Service: Cardiovascular;  Laterality: N/A;   CORONARY ULTRASOUND/IVUS N/A 12/26/2021   Procedure: Intravascular Ultrasound/IVUS;  Surgeon: Armando Reichert, MD;  Location: [facility] INVASIVE CV LAB;  Service: Cardiovascular;  Laterality: N/A;   CYSTOSCOPY WITH INSERTION OF UROLIFT     CYSTOSCOPY WITH INSERTION OF UROLIFT N/A 11/29/2019   Procedure: CYSTOSCOPY WITH INSERTION OF UROLIFT;  Surgeon: Vanna Scotland, MD;  Location: [facility] ORS;  Service: Urology;  Laterality: N/A;   EYE SURGERY  Cataract 2005   HOLEP-LASER ENUCLEATION OF THE PROSTATE WITH MORCELLATION N/A 11/29/2019   Procedure: HOLEP-LASER ENUCLEATION OF THE PROSTATE;  Surgeon: Vanna Scotland, MD;  Location: [facility] ORS;  Service: Urology;  Laterality: N/A;   KNEE ARTHROSCOPY  2008   LEFT HEART CATH AND CORONARY ANGIOGRAPHY N/A 12/26/2021   Procedure: LEFT HEART CATH AND CORONARY ANGIOGRAPHY;  Surgeon: Armando Reichert, MD;  Location: [facility] INVASIVE CV LAB;  Service: Cardiovascular;  Laterality: N/A;   PITUITARY EXCISION     SHOULDER ARTHROSCOPY Right 07/22/2022   Procedure: ARTHROSCOPY SHOULDER;  Surgeon: Ross Marcus, MD;  Location: [facility] ORS;  Service: Orthopedics;  Laterality: Right;  SUBACROMIAL DECOMPRESSION, OPEN BICEPS TENODESIS   TONSILLECTOMY     Social History   Socioeconomic History   Marital status: Married    Spouse name: Not on file   Number of children: Not on file   Years of education: Not on file   Highest education level: Not on file  Occupational History   Not on file  Tobacco Use   Smoking status: Never   Smokeless tobacco: Never  Vaping Use    Vaping Use: Never used  Substance and Sexual Activity   Alcohol use: Not Currently   Drug use: Never   Sexual activity: Yes  Other Topics Concern   Not on file  Social History Narrative   Not on file   Social Determinants of Health   Financial Resource Strain: Low Risk  (09/07/2022)   Overall Financial Resource Strain (CARDIA)    Difficulty of Paying Living Expenses: Not hard at all  Food Insecurity: No Food Insecurity (09/07/2022)   Hunger Vital Sign    Worried About Running Out of Food in the Last Year: Never true    Ran Out of Food in the Last Year: Never true  Transportation Needs: No Transportation Needs (09/07/2022)   PRAPARE - Administrator, Civil Service (Medical): No    Lack of Transportation (Non-Medical): No  Physical Activity: Insufficiently Active (09/07/2022)   Exercise Vital Sign    Days of Exercise per Week: 6 days    Minutes of Exercise per Session: 20 min  Stress: No Stress Concern Present (09/07/2022)   [employer] of Occupational Health - Occupational Stress Questionnaire    Feeling of Stress : Only a little  Social Connections: Unknown (09/07/2022)   Social Connection and Isolation Panel [NHANES]    Frequency of Communication with Friends and Family: Once a week    Frequency of Social Gatherings with Friends and Family: Twice a week    Attends Religious Services: Not on file    Active Member of Clubs or Organizations: Yes    Attends Banker Meetings: More than 4 times per year    Marital Status: Married  Catering manager Violence: Not At Risk (09/10/2022)   Humiliation, Afraid, Rape, and Kick questionnaire    Fear of Current or Ex-Partner: No    Emotionally Abused: No    Physically Abused: No    Sexually Abused: No   Family Status  Relation Name Status   Mother Derrin Magwood (Not Specified)   Father Kharson Franzel (Not Specified)   Family History  Problem Relation Age of Onset   Hypertension Mother    Arthritis Mother     Colon cancer Father    Cancer Father    Allergies  Allergen Reactions   Penicillins Hives    Patient Care Team: Burnett Corrente as PCP - General (Physician Assistant)   Medications: Outpatient Medications Prior to Visit  Medication Sig   aspirin EC 81 MG tablet Take 81 mg by mouth daily. Swallow whole.   clopidogrel (PLAVIX) 75 MG tablet Take 1 tablet (75 mg total) by mouth daily. (Patient taking differently: Take 75 mg by mouth every morning.)   finasteride (PROSCAR) 5 MG tablet Take 1 tablet (5 mg total) by mouth at bedtime. (Patient taking differently: Take 5 mg by mouth every other day. AM)   losartan (COZAAR) 100 MG tablet TAKE 1 TABLET BY MOUTH EVERY DAY (Patient taking differently: Take 100 mg by mouth  every morning.)   metoprolol succinate (TOPROL-XL) 25 MG 24 hr tablet Take 25 mg by mouth every morning.   Multiple Vitamin (MULTIVITAMIN WITH MINERALS) TABS tablet Take 1 tablet by mouth daily.   rosuvastatin (CRESTOR) 40 MG tablet Take 1 tablet (40 mg total) by mouth daily. (Patient taking differently: Take 40 mg by mouth at bedtime.)   Zinc 50 MG TABS Take 50 mg by mouth daily.   zolpidem (AMBIEN) 5 MG tablet Take 1 tablet (5 mg total) by mouth at bedtime as needed for sleep.   [DISCONTINUED] traMADol (ULTRAM) 50 MG tablet Take 50 mg by mouth every 6 (six) hours as needed.   No facility-administered medications prior to visit.    Review of Systems  Constitutional:  Negative for fatigue and fever.  Respiratory:  Negative for cough and shortness of breath.   Cardiovascular:  Negative for chest pain, palpitations and leg swelling.  Genitourinary:  Positive for frequency.  Musculoskeletal:  Positive for arthralgias.  Neurological:  Negative for dizziness and headaches.  All other systems reviewed and are negative.   Objective    BP 130/80 (BP Location: Left Arm, Patient Position: Sitting, Cuff Size: Normal) Comment: at home  Pulse 68   Temp 98.1 F (36.7 C)  (Oral)   Resp 16   Ht 5\' 7"  (1.702 m)   Wt 173 lb (78.5 kg)   BMI 27.10 kg/m   Physical Exam Constitutional:      General: He is awake.     Appearance: He is well-developed.  HENT:     Head: Normocephalic.     Right Ear: Tympanic membrane, ear canal and external ear normal.     Left Ear: Tympanic membrane, ear canal and external ear normal.     Nose: Nose normal. No congestion or rhinorrhea.     Mouth/Throat:     Mouth: Mucous membranes are moist.     Pharynx: No oropharyngeal exudate or posterior oropharyngeal erythema.  Eyes:     Pupils: Pupils are equal, round, and reactive to light.  Neck:     Comments: Right cervical adenopathy vs thyroid nodule? Firm immobile ~1 cm nodule  Cardiovascular:     Rate and Rhythm: Normal rate and regular rhythm.     Heart sounds: Normal heart sounds.  Pulmonary:     Effort: Pulmonary effort is normal.     Breath sounds: Normal breath sounds.  Abdominal:     General: There is no distension.     Palpations: Abdomen is soft.     Tenderness: There is no abdominal tenderness. There is no guarding.  Musculoskeletal:     Cervical back: Normal range of motion.     Right lower leg: No edema.     Left lower leg: No edema.  Skin:    General: Skin is warm.  Neurological:     Mental Status: He is alert and oriented to person, place, and time.  Psychiatric:        Attention and Perception: Attention normal.        Mood and Affect: Mood normal.        Speech: Speech normal.        Behavior: Behavior normal. Behavior is cooperative.      Last depression screening scores    09/27/2022    9:37 AM 09/10/2022    3:41 PM 04/18/2022   10:43 AM  PHQ 2/9 Scores  PHQ - 2 Score 0 0 0  PHQ- 9 Score   0  Last fall risk screening    09/27/2022    9:37 AM  Fall Risk   Falls in the past year? 0  Number falls in past yr: 0  Injury with Fall? 0  Risk for fall due to : No Fall Risks   Last Audit-C alcohol use screening    09/23/2022    9:04 AM   Alcohol Use Disorder Test (AUDIT)  1. How often do you have a drink containing alcohol? 1  2. How many drinks containing alcohol do you have on a typical day when you are drinking? 0  3. How often do you have six or more drinks on one occasion? 0  AUDIT-C Score 1   A score of 3 or more in women, and 4 or more in men indicates increased risk for alcohol abuse, EXCEPT if all of the points are from question 1   No results found for any visits on 09/27/22.  Assessment & Plan    Routine Health Maintenance and Physical Exam  Exercise Activities and Dietary recommendations --balanced diet high in fiber and protein, low in sugars, carbs, fats. --physical activity/exercise 30 minutes 3-5 times a week     Immunization History  Administered Date(s) Administered   Fluad Quad(high Dose 65+) 03/22/2021   Influenza,inj,Quad PF,6+ Mos 02/24/2019   Influenza-Unspecified 03/31/2020   PFIZER Comirnaty(Gray Top)Covid-19 Tri-Sucrose Vaccine 09/06/2019, 09/27/2019   PNEUMOCOCCAL CONJUGATE-20 09/05/2021   Pfizer Covid-19 Vaccine Bivalent Booster 2131yrs & up 11/22/2020, 04/13/2021   Tdap 10/19/2019   Zoster Recombinat (Shingrix) 06/02/2019, 10/19/2019    Health Maintenance  Topic Date Due   COVID-19 Vaccine (5 - 2023-24 season) 02/15/2022   INFLUENZA VACCINE  01/16/2023   Medicare Annual Wellness (AWV)  09/27/2023   COLONOSCOPY (Pts 45-3259yrs Insurance coverage will need to be confirmed)  04/12/2024   DTaP/Tdap/Td (2 - Td or Tdap) 10/18/2029   Pneumonia Vaccine 4765+ Years old  Completed   Hepatitis C Screening  Completed   Zoster Vaccines- Shingrix  Completed   HPV VACCINES  Aged Out   Discussed health benefits of physical activity, and encouraged him to engage in regular exercise appropriate for his age and condition. Problem List Items Addressed This Visit       Cardiovascular and Mediastinum   White coat syndrome with diagnosis of hypertension    Chronic, well controlled Managed with  losartan 100 and metop 25  F/b cardiology  F.u 6 mo         Genitourinary   Benign prostatic hyperplasia with weak urinary stream    Stable symptoms Managed with finasteride 5 mg Repeat psa      Relevant Orders   PSA     Other   Hyperlipidemia    Repeat fasting lipids, LDL goal < 100 Managed w/ crestor 40 mg  Taking asa 81 mg  The 10-year ASCVD risk score (Arnett DK, et al., 2019) is: 15.4%       Relevant Orders   Lipid Profile   Prediabetes    Repeat A1c today to demonstrate improvement/stability      Relevant Orders   HgB A1c   Other Visit Diagnoses     Annual physical exam    -  Primary   Need for COVID-19 vaccine       Relevant Orders   Pfizer Fall 2023 Covid-19 Vaccine 4131yrs and older   Cervical adenopathy       Relevant Orders   US THYROID   US Soft Tissue Head/Neck (NON-THYROID)   Thyroid  nodule       Relevant Orders   US THYROID   US Soft Tissue Head/Neck (NON-THYROID)        Return in about 6 months (around 03/29/2023) for chronic conditions.     I, Brett Ferguson, [name] have reviewed all documentation for this visit. The documentation on  09/27/22  for the exam, diagnosis, procedures, and orders are all accurate and complete.  Brett Ferguson, [name] [facility] [address] [place], [place], [address] Office: [phone] Fax: [phone]   [facility]

## 2022-09-27 ENCOUNTER — Ambulatory Visit (INDEPENDENT_AMBULATORY_CARE_PROVIDER_SITE_OTHER): Payer: Medicare HMO | Admitting: Physician Assistant

## 2022-09-27 ENCOUNTER — Encounter: Payer: Self-pay | Admitting: Physician Assistant

## 2022-09-27 VITALS — BP 130/80 | HR 68 | Temp 98.1°F | Resp 16 | Ht 67.0 in | Wt 173.0 lb

## 2022-09-27 DIAGNOSIS — Z0000 Encounter for general adult medical examination without abnormal findings: Secondary | ICD-10-CM

## 2022-09-27 DIAGNOSIS — E785 Hyperlipidemia, unspecified: Secondary | ICD-10-CM | POA: Diagnosis not present

## 2022-09-27 DIAGNOSIS — I10 Essential (primary) hypertension: Secondary | ICD-10-CM

## 2022-09-27 DIAGNOSIS — R3912 Poor urinary stream: Secondary | ICD-10-CM

## 2022-09-27 DIAGNOSIS — R590 Localized enlarged lymph nodes: Secondary | ICD-10-CM

## 2022-09-27 DIAGNOSIS — R7303 Prediabetes: Secondary | ICD-10-CM

## 2022-09-27 DIAGNOSIS — E041 Nontoxic single thyroid nodule: Secondary | ICD-10-CM

## 2022-09-27 DIAGNOSIS — N401 Enlarged prostate with lower urinary tract symptoms: Secondary | ICD-10-CM

## 2022-09-27 DIAGNOSIS — Z23 Encounter for immunization: Secondary | ICD-10-CM

## 2022-09-27 NOTE — Assessment & Plan Note (Signed)
Stable symptoms Managed with finasteride 5 mg Repeat psa

## 2022-09-27 NOTE — Assessment & Plan Note (Signed)
Repeat A1c today to demonstrate improvement/stability

## 2022-09-27 NOTE — Assessment & Plan Note (Signed)
Chronic, well controlled Managed with losartan 100 and metop 25  F/b cardiology  F.u 6 mo

## 2022-09-27 NOTE — Assessment & Plan Note (Addendum)
Repeat fasting lipids, LDL goal < 100 Managed w/ crestor 40 mg  Taking asa 81 mg  The 10-year ASCVD risk score (Arnett DK, et al., 2019) is: 15.4%

## 2022-10-01 LAB — LIPID PANEL
Chol/HDL Ratio: 3.2 ratio (ref 0.0–5.0)
Cholesterol, Total: 177 mg/dL (ref 100–199)
HDL: 55 mg/dL (ref >39)
LDL Chol Calc (NIH): 100 mg/dL — ABNORMAL HIGH (ref 0–99)
Triglycerides: 123 mg/dL (ref 0–149)
VLDL Cholesterol Cal: 22 mg/dL (ref 5–40)

## 2022-10-01 LAB — HEMOGLOBIN A1C
Est. average glucose Bld gHb Est-mCnc: 128 mg/dL
Hgb A1c MFr Bld: 6.1 % — ABNORMAL HIGH (ref 4.8–5.6)

## 2022-10-01 LAB — PSA: Prostate Specific Ag, Serum: 1.8 ng/mL (ref 0.0–4.0)

## 2022-10-11 ENCOUNTER — Ambulatory Visit
Admission: RE | Admit: 2022-10-11 | Discharge: 2022-10-11 | Disposition: A | Discharge status: Home / Self Care | Payer: Medicare HMO | Source: Ambulatory Visit | Attending: Physician Assistant | Admitting: Physician Assistant

## 2022-10-11 DIAGNOSIS — R590 Localized enlarged lymph nodes: Secondary | ICD-10-CM

## 2022-10-11 DIAGNOSIS — E041 Nontoxic single thyroid nodule: Secondary | ICD-10-CM | POA: Diagnosis present

## 2022-10-29 ENCOUNTER — Ambulatory Visit (INDEPENDENT_AMBULATORY_CARE_PROVIDER_SITE_OTHER): Payer: Medicare HMO | Admitting: Physician Assistant

## 2022-10-29 ENCOUNTER — Encounter: Payer: Self-pay | Admitting: Physician Assistant

## 2022-10-29 ENCOUNTER — Ambulatory Visit
Admission: RE | Admit: 2022-10-29 | Discharge: 2022-10-29 | Disposition: A | Discharge status: Home / Self Care | Payer: Medicare HMO | Source: Ambulatory Visit | Attending: Physician Assistant | Admitting: Physician Assistant

## 2022-10-29 ENCOUNTER — Ambulatory Visit
Admission: RE | Admit: 2022-10-29 | Discharge: 2022-10-29 | Disposition: A | Discharge status: Home / Self Care | Payer: Medicare HMO | Attending: Physician Assistant | Admitting: Physician Assistant

## 2022-10-29 VITALS — BP 110/68 | HR 68 | Ht 67.0 in | Wt 169.4 lb

## 2022-10-29 DIAGNOSIS — M7918 Myalgia, other site: Secondary | ICD-10-CM

## 2022-10-29 NOTE — Progress Notes (Signed)
I,Sha'taria Tyson,acting as a Neurosurgeon for [employer], [name].,have documented all relevant documentation on the behalf of [name], [name],as directed by  [name], [name] while in the presence of [name], [name].   Established patient visit   Patient: Brett Wilson   DOB: 15-Jul-1955   67 y.o. Male  MRN: [id number] Visit Date: 10/29/2022  Today's healthcare provider: [name], [name]   Cc. Left buttock pain   Subjective    HPI   Patient is being seen due to pain in left buttock area X 2 weeks. Pt denies injury, reports pressure/pain while sitting. Denies radiation of pain, numbness, rashes, redness. Reports taking tylneol with minor improvement.  Medications: Outpatient Medications Prior to Visit  Medication Sig   aspirin EC 81 MG tablet Take 81 mg by mouth daily. Swallow whole.   clopidogrel (PLAVIX) 75 MG tablet Take 1 tablet (75 mg total) by mouth daily. (Patient taking differently: Take 75 mg by mouth every morning.)   finasteride (PROSCAR) 5 MG tablet Take 1 tablet (5 mg total) by mouth at bedtime. (Patient taking differently: Take 5 mg by mouth every other day. AM)   losartan (COZAAR) 100 MG tablet TAKE 1 TABLET BY MOUTH EVERY DAY (Patient taking differently: Take 100 mg by mouth every morning.)   metoprolol succinate (TOPROL-XL) 25 MG 24 hr tablet Take 25 mg by mouth every morning. Taking 2 tabs daily   Multiple Vitamin (MULTIVITAMIN WITH MINERALS) TABS tablet Take 1 tablet by mouth daily.   rosuvastatin (CRESTOR) 40 MG tablet Take 1 tablet (40 mg total) by mouth daily. (Patient taking differently: Take 40 mg by mouth at bedtime.)   Zinc 50 MG TABS Take 50 mg by mouth daily.   zolpidem (AMBIEN) 5 MG tablet Take 1 tablet (5 mg total) by mouth at bedtime as needed for sleep.   No facility-administered medications prior to visit.    Review of Systems  Constitutional:  Negative for fatigue and fever.  Respiratory:  Negative for cough and shortness of  breath.   Cardiovascular:  Negative for chest pain, palpitations and leg swelling.  Musculoskeletal:  Positive for myalgias.  Neurological:  Negative for dizziness and headaches.      Objective    BP 110/68 Comment: home value  Pulse 68   Ht 5\' 7"  (1.702 m)   Wt 169 lb 6.4 oz (76.8 kg)   SpO2 100%   BMI 26.53 kg/m   Physical Exam Vitals reviewed.  Constitutional:      Appearance: He is not ill-appearing.  HENT:     Head: Normocephalic.  Eyes:     Conjunctiva/sclera: Conjunctivae normal.  Cardiovascular:     Rate and Rhythm: Normal rate.  Pulmonary:     Effort: Pulmonary effort is normal. No respiratory distress.  Neurological:     General: No focal deficit present.     Mental Status: He is alert and oriented to person, place, and time.  Psychiatric:        Mood and Affect: Mood normal.        Behavior: Behavior normal.      No results found for any visits on 10/29/22.  Assessment & Plan     1. Left buttock pain Likely muscular pain  Advised piriformis/sciatica stretches Will check hip xrays  - DG Hip Unilat W OR W/O Pelvis 2-3 Views Left; Future - DG Sacrum/Coccyx; Future   Return if symptoms worsen or fail to improve.      I, [name],  [name] have reviewed all documentation for this visit. The documentation on  10/29/22   for the exam, diagnosis, procedures, and orders are all accurate and complete.  [name], [name] [facility] [address] [place], [place], [address] Office: [phone] Fax: [phone]   [facility]

## 2022-11-04 ENCOUNTER — Other Ambulatory Visit: Payer: Self-pay | Admitting: Physician Assistant

## 2022-11-04 DIAGNOSIS — M533 Sacrococcygeal disorders, not elsewhere classified: Secondary | ICD-10-CM

## 2022-11-06 ENCOUNTER — Encounter: Payer: Self-pay | Admitting: Physician Assistant

## 2022-11-06 ENCOUNTER — Ambulatory Visit (INDEPENDENT_AMBULATORY_CARE_PROVIDER_SITE_OTHER): Payer: Medicare HMO | Admitting: Physician Assistant

## 2022-11-06 VITALS — BP 141/96 | HR 67 | Temp 98.9°F | Resp 13 | Ht 67.0 in | Wt 170.9 lb

## 2022-11-06 DIAGNOSIS — M79642 Pain in left hand: Secondary | ICD-10-CM | POA: Diagnosis not present

## 2022-11-06 DIAGNOSIS — M79641 Pain in right hand: Secondary | ICD-10-CM

## 2022-11-06 DIAGNOSIS — R768 Other specified abnormal immunological findings in serum: Secondary | ICD-10-CM

## 2022-11-06 NOTE — Progress Notes (Signed)
I,Vanessa  Vital,acting as a Neurosurgeon for [employer], [name].,have documented all relevant documentation on the behalf of [name], [name],as directed by  [name], [name] while in the presence of [name], [name].    Established patient visit   Patient: Brett Wilson   DOB: 1956/03/27   67 y.o. Male  MRN: [id number] Visit Date: 11/06/2022  Today's healthcare provider: [name], [name]  Cc. Bilateral hand pain  Subjective    HPI   Patient is here today as follow up, states the stretches have been helping. Patient reports has now been having pain in both hands for about 10 days. Reports he had a history of a positive ANA but was asymptomatic until now. Would like a referral to rheumataology.  Medications: Outpatient Medications Prior to Visit  Medication Sig   aspirin EC 81 MG tablet Take 81 mg by mouth daily. Swallow whole.   clopidogrel (PLAVIX) 75 MG tablet Take 1 tablet (75 mg total) by mouth daily. (Patient taking differently: Take 75 mg by mouth every morning.)   finasteride (PROSCAR) 5 MG tablet Take 1 tablet (5 mg total) by mouth at bedtime. (Patient taking differently: Take 5 mg by mouth every other day. AM)   losartan (COZAAR) 100 MG tablet TAKE 1 TABLET BY MOUTH EVERY DAY (Patient taking differently: Take 100 mg by mouth every morning.)   metoprolol succinate (TOPROL-XL) 25 MG 24 hr tablet Take 25 mg by mouth every morning. Taking 2 tabs daily   Multiple Vitamin (MULTIVITAMIN WITH MINERALS) TABS tablet Take 1 tablet by mouth daily.   rosuvastatin (CRESTOR) 40 MG tablet Take 1 tablet (40 mg total) by mouth daily. (Patient taking differently: Take 40 mg by mouth at bedtime.)   Zinc 50 MG TABS Take 50 mg by mouth daily.   zolpidem (AMBIEN) 5 MG tablet Take 1 tablet (5 mg total) by mouth at bedtime as needed for sleep.   No facility-administered medications prior to visit.    Review of Systems  All other systems reviewed and are negative.    Objective     BP (!) 141/96 (BP Location: Left Arm, Patient Position: Sitting, Cuff Size: Normal)   Pulse 67   Temp 98.9 F (37.2 C) (Oral)   Resp 13   Ht 5\' 7"  (1.702 m)   Wt 170 lb 14.4 oz (77.5 kg)   SpO2 98%   BMI 26.77 kg/m    Physical Exam Vitals reviewed.  Constitutional:      Appearance: He is not ill-appearing.  HENT:     Head: Normocephalic.  Eyes:     Conjunctiva/sclera: Conjunctivae normal.  Cardiovascular:     Rate and Rhythm: Normal rate.  Pulmonary:     Effort: Pulmonary effort is normal. No respiratory distress.  Musculoskeletal:     Comments: B/l hands without edema, erythema. Some prominent to finger joints and knuckles.  Neurological:     General: No focal deficit present.     Mental Status: He is alert and oriented to person, place, and time.  Psychiatric:        Mood and Affect: Mood normal.        Behavior: Behavior normal.      No results found for any visits on 11/06/22.  Assessment & Plan     1. Positive ANA (antinuclear antibody) 2. Bilateral hand pain Pt taking PRN otc antiinflammatories as allowed, tylneol Offered steroid course, pt declined. Referral placed.  - Ambulatory referral to Rheumatology   Return if symptoms  worsen or fail to improve.      I, [name], [name] have reviewed all documentation for this visit. The documentation on  11/06/22   for the exam, diagnosis, procedures, and orders are all accurate and complete.  [name], [name] [facility] [address] [place], [place], [address] Office: [phone] Fax: [phone]   [facility]

## 2022-11-07 ENCOUNTER — Encounter: Payer: Self-pay | Admitting: Physician Assistant

## 2022-11-07 ENCOUNTER — Other Ambulatory Visit: Payer: Self-pay | Admitting: Physician Assistant

## 2022-11-07 DIAGNOSIS — M79641 Pain in right hand: Secondary | ICD-10-CM

## 2022-11-07 MED ORDER — PREDNISONE 20 MG PO TABS
20.0000 mg | ORAL_TABLET | Freq: Every day | ORAL | 0 refills | Status: DC
Start: 2022-11-07 — End: 2022-11-20

## 2022-11-08 ENCOUNTER — Other Ambulatory Visit: Payer: Self-pay | Admitting: Physician Assistant

## 2022-11-08 DIAGNOSIS — I10 Essential (primary) hypertension: Secondary | ICD-10-CM

## 2022-11-08 NOTE — Telephone Encounter (Signed)
Requested by interface surescripts.  Requested Prescriptions  Pending Prescriptions Disp Refills   losartan (COZAAR) 100 MG tablet [Pharmacy Med Name: LOSARTAN POTASSIUM 100 MG TAB] 90 tablet 0    Sig: TAKE 1 TABLET BY MOUTH EVERY DAY     Cardiovascular:  Angiotensin Receptor Blockers Failed - 11/08/2022  2:41 AM      Failed - Last BP in normal range    BP Readings from Last 1 Encounters:  11/06/22 (!) 141/96         Passed - Cr in normal range and within 180 days    Creatinine, Ser  Date Value Ref Range Status  07/10/2022 0.96 0.61 - 1.24 mg/dL Final         Passed - K in normal range and within 180 days    Potassium  Date Value Ref Range Status  07/10/2022 3.8 3.5 - 5.1 mmol/L Final         Passed - Patient is not pregnant      Passed - Valid encounter within last 6 months    Recent Outpatient Visits           2 days ago Positive ANA (antinuclear antibody)   [facility] [name], [name]   1 week ago Left buttock pain   [facility] [name], [name]   4 months ago Hordeolum externum of left upper eyelid   [facility] [place], [place], [name]   6 months ago Acute pain of both shoulders   [facility] [name], [name]   7 months ago Prediabetes   [facility] [name], [name]       Future Appointments             In 3 months Deirdre Evener, MD [facility]

## 2022-11-20 ENCOUNTER — Ambulatory Visit (INDEPENDENT_AMBULATORY_CARE_PROVIDER_SITE_OTHER): Payer: Medicare HMO | Admitting: Physician Assistant

## 2022-11-20 ENCOUNTER — Encounter: Payer: Self-pay | Admitting: Physician Assistant

## 2022-11-20 VITALS — BP 134/76 | HR 56 | Temp 98.4°F | Resp 13 | Ht 67.0 in | Wt 170.8 lb

## 2022-11-20 DIAGNOSIS — M79641 Pain in right hand: Secondary | ICD-10-CM | POA: Diagnosis not present

## 2022-11-20 DIAGNOSIS — M79642 Pain in left hand: Secondary | ICD-10-CM

## 2022-11-20 DIAGNOSIS — R110 Nausea: Secondary | ICD-10-CM | POA: Diagnosis not present

## 2022-11-20 MED ORDER — PREDNISONE 10 MG PO TABS
ORAL_TABLET | ORAL | 0 refills | Status: AC
Start: 2022-11-20 — End: 2022-11-28

## 2022-11-20 MED ORDER — CELECOXIB 100 MG PO CAPS
100.0000 mg | ORAL_CAPSULE | Freq: Two times a day (BID) | ORAL | 0 refills | Status: DC | PRN
Start: 2022-11-20 — End: 2023-01-02

## 2022-11-20 NOTE — Progress Notes (Signed)
I,Vanessa  Vital,acting as a Neurosurgeon for [employer], [name].,have documented all relevant documentation on the behalf of [name], [name],as directed by  [name], [name] while in the presence of [name], [name].   Established patient visit   Patient: Brett Wilson   DOB: 18-Oct-1955   67 y.o. Male  MRN: [id number] Visit Date: 11/20/2022  Today's healthcare provider: [name], [name]  Cc. Hand pain, nausea.  Subjective    HPI HPI     Nausea    Additional comments:        Last edited by Lubertha Basque, CMA on 11/20/2022 10:58 AM.      Pt reports his bilateral hand pain is back and severe. Reports the prednisone helped before and would like to try again. He has an upcoming appt with rheumatology.   He also reports nausea for the first 3 hours in the AM. He takes dramamine sometimes which doesn't help. Denies nasal congestion/PND   Medications: Outpatient Medications Prior to Visit  Medication Sig   aspirin EC 81 MG tablet Take 81 mg by mouth daily. Swallow whole.   clopidogrel (PLAVIX) 75 MG tablet Take 1 tablet (75 mg total) by mouth daily. (Patient taking differently: Take 75 mg by mouth every morning.)   finasteride (PROSCAR) 5 MG tablet Take 1 tablet (5 mg total) by mouth at bedtime. (Patient taking differently: Take 5 mg by mouth every other day. AM)   losartan (COZAAR) 100 MG tablet TAKE 1 TABLET BY MOUTH EVERY DAY   metoprolol succinate (TOPROL-XL) 25 MG 24 hr tablet Take 25 mg by mouth every morning. Taking 2 tabs daily   Multiple Vitamin (MULTIVITAMIN WITH MINERALS) TABS tablet Take 1 tablet by mouth daily.   rosuvastatin (CRESTOR) 40 MG tablet Take 1 tablet (40 mg total) by mouth daily. (Patient taking differently: Take 40 mg by mouth at bedtime.)   Zinc 50 MG TABS Take 50 mg by mouth daily.   zolpidem (AMBIEN) 5 MG tablet Take 1 tablet (5 mg total) by mouth at bedtime as needed for sleep.   [DISCONTINUED] predniSONE (DELTASONE) 20 MG tablet Take 1  tablet (20 mg total) by mouth daily with breakfast.   No facility-administered medications prior to visit.    Review of Systems  Constitutional:  Negative for fatigue and fever.  Respiratory:  Negative for cough and shortness of breath.   Cardiovascular:  Negative for chest pain, palpitations and leg swelling.  Gastrointestinal:  Positive for nausea.  Musculoskeletal:  Positive for arthralgias.  Neurological:  Negative for dizziness and headaches.      Objective    BP 134/76 (BP Location: Right Arm, Patient Position: Sitting, Cuff Size: Normal)   Pulse (!) 56   Temp 98.4 F (36.9 C) (Oral)   Resp 13   Ht 5\' 7"  (1.702 m)   Wt 170 lb 12.8 oz (77.5 kg)   SpO2 100%   BMI 26.75 kg/m   Physical Exam Vitals reviewed.  Constitutional:      Appearance: He is not ill-appearing.  HENT:     Head: Normocephalic.  Eyes:     Conjunctiva/sclera: Conjunctivae normal.  Cardiovascular:     Rate and Rhythm: Normal rate.  Pulmonary:     Effort: Pulmonary effort is normal. No respiratory distress.  Neurological:     General: No focal deficit present.     Mental Status: He is alert and oriented to person, place, and time.  Psychiatric:        Mood  and Affect: Mood normal.        Behavior: Behavior normal.      No results found for any visits on 11/20/22.  Assessment & Plan     1. Bilateral hand pain Taper of prednisone, and celebrex prn until he sees rheum .  - predniSONE (DELTASONE) 10 MG tablet; Take 6 tablets (60 mg total) by mouth daily with breakfast for 1 day, THEN 5 tablets (50 mg total) daily with breakfast for 1 day, THEN 4 tablets (40 mg total) daily with breakfast for 1 day, THEN 3 tablets (30 mg total) daily with breakfast for 1 day, THEN 2 tablets (20 mg total) daily with breakfast for 1 day, THEN 1 tablet (10 mg total) daily with breakfast for 1 day, THEN 0.5 tablets (5 mg total) daily with breakfast for 2 days.  Dispense: 22 tablet; Refill: 0 - celecoxib (CELEBREX) 100  MG capsule; Take 1 capsule (100 mg total) by mouth 2 (two) times daily as needed.  Dispense: 90 capsule; Refill: 0  2. Nausea Rec. Pepcid at night vs in Am to help w/ nausea  Return if symptoms worsen or fail to improve.      I, [name], [name] have reviewed all documentation for this visit. The documentation on  11/20/22   for the exam, diagnosis, procedures, and orders are all accurate and complete.  [name], [name] [facility] [address] [place], [place], [address] Office: [phone] Fax: [phone]   [facility] [place] [facility]

## 2022-12-02 ENCOUNTER — Other Ambulatory Visit: Payer: Self-pay | Admitting: Physician Assistant

## 2022-12-02 ENCOUNTER — Encounter: Payer: Self-pay | Admitting: Physician Assistant

## 2022-12-02 DIAGNOSIS — G4709 Other insomnia: Secondary | ICD-10-CM

## 2022-12-02 MED ORDER — ZOLPIDEM TARTRATE 5 MG PO TABS
5.0000 mg | ORAL_TABLET | Freq: Every evening | ORAL | 1 refills | Status: DC | PRN
Start: 2022-12-02 — End: 2024-03-29

## 2022-12-30 ENCOUNTER — Other Ambulatory Visit: Payer: Self-pay | Admitting: Urology

## 2022-12-30 ENCOUNTER — Other Ambulatory Visit: Payer: Self-pay | Admitting: Physician Assistant

## 2022-12-30 DIAGNOSIS — I10 Essential (primary) hypertension: Secondary | ICD-10-CM

## 2022-12-30 DIAGNOSIS — N401 Enlarged prostate with lower urinary tract symptoms: Secondary | ICD-10-CM

## 2023-01-02 ENCOUNTER — Other Ambulatory Visit: Payer: Self-pay | Admitting: Physician Assistant

## 2023-01-02 DIAGNOSIS — M79641 Pain in right hand: Secondary | ICD-10-CM

## 2023-01-22 ENCOUNTER — Ambulatory Visit: Payer: Medicare HMO | Admitting: Urology

## 2023-01-22 VITALS — BP 182/99 | HR 71 | Ht 67.0 in | Wt 174.2 lb

## 2023-01-22 DIAGNOSIS — N401 Enlarged prostate with lower urinary tract symptoms: Secondary | ICD-10-CM

## 2023-01-22 DIAGNOSIS — R350 Frequency of micturition: Secondary | ICD-10-CM | POA: Diagnosis not present

## 2023-01-22 LAB — URINALYSIS, COMPLETE
Bilirubin, UA: NEGATIVE
Glucose, UA: NEGATIVE
Leukocytes,UA: NEGATIVE
Nitrite, UA: NEGATIVE
RBC, UA: NEGATIVE
Specific Gravity, UA: 1.025 (ref 1.005–1.030)
Urobilinogen, Ur: 0.2 mg/dL (ref 0.2–1.0)
pH, UA: 5.5 (ref 5.0–7.5)

## 2023-01-22 LAB — MICROSCOPIC EXAMINATION

## 2023-01-22 LAB — BLADDER SCAN AMB NON-IMAGING: Scan Result: 0

## 2023-01-22 MED ORDER — FINASTERIDE 5 MG PO TABS
ORAL_TABLET | ORAL | 3 refills | Status: DC
Start: 2023-01-22 — End: 2024-03-29

## 2023-01-22 NOTE — Progress Notes (Signed)
I,Dina M Abdulla,acting as a scribe for Vanna Scotland, MD.,have documented all relevant documentation on the behalf of Vanna Scotland, MD,as directed by  Vanna Scotland, MD while in the presence of Vanna Scotland, MD.  01/22/2023 7:50 PM   Brett Wilson [birth year] [id number]  Referring provider: [name], [name] [address] [place],  [place] [address]  Chief Complaint  Patient presents with   Benign Prostatic Hypertrophy    HPI: 67 year old male presenting today for follow up. He was last seen in 2022. He has a personal history of BPH status post Urolift, with the nucleation of his median lobe in 2021.  He reports urinary frequency at about 2.5-3 hours during the day time and 1.5 hours at night. Nocturia x2 and he usually stops fluid intake around 6pm.  Symptoms stable on Finasteride. Results for orders placed or performed in visit on 01/22/23  Microscopic Examination   Urine  Result Value Ref Range   WBC, UA 0-5 0 - 5 /hpf   RBC, Urine 0-2 0 - 2 /hpf   Epithelial Cells (non renal) 0-10 0 - 10 /hpf   Crystals Present (A) N/A   Crystal Type Calcium Oxalate N/A   Bacteria, UA Few None seen/Few  Urinalysis, Complete  Result Value Ref Range   Specific Gravity, UA 1.025 1.005 - 1.030   pH, UA 5.5 5.0 - 7.5   Color, UA Yellow Yellow   Appearance Ur Clear Clear   Leukocytes,UA Negative Negative   Protein,UA 1+ (A) Negative/Trace   Glucose, UA Negative Negative   Ketones, UA Trace (A) Negative   RBC, UA Negative Negative   Bilirubin, UA Negative Negative   Urobilinogen, Ur 0.2 0.2 - 1.0 mg/dL   Nitrite, UA Negative Negative   Microscopic Examination See below:   Bladder Scan (Post Void Residual) in office  Result Value Ref Range   Scan Result 0 ml       PMH: Past Medical History:  Diagnosis Date   Allergy    Arthritis 03/1999   BPH (benign prostatic hyperplasia)    Coronary artery disease 12/19/2021   a.) cCTA 12/19/2021: Ca score 220 (66th  percentile for age/sex match control); >70% pLAD territory (FFR 0.69); b.) LHC/PCI 12/26/2021: 40% p-mRCA, 90% p-mLAD (2.75 x 26 mm Onxy Frontier DES x 1)   Diastolic dysfunction 09/10/2021   a.) stress TTE 09/10/2021: EF> 55%, triv-mild MR/TR/PR, G1DD.   Dysplastic nevus 01/17/2020   Left mid side. Severe atypia with scar, close to margin. exc 03/14/2020   Dysplastic nevus 01/17/2020   Left calf. Moderate atypia, deep margin involved.    Dysplastic nevus 01/17/2020   LUQ abdomen. Mild atypia, limited margins free.    Hepatitis C antibody test positive    Hyperlipidemia    Hypertension    Liver nodule    Long term current use of antithrombotics/antiplatelets    a.) on DAPT (ASA + clopidogrel)   Lung nodule    Nontraumatic rotator cuff tear, right    Pituitary adenoma (HCC)    Pre-diabetes    Thyroid nodule    Typical angina [facility])     Surgical History: Past Surgical History:  Procedure Laterality Date   CATARACT EXTRACTION Bilateral    COLONOSCOPY WITH PROPOFOL N/A 04/13/2019   Procedure: COLONOSCOPY WITH PROPOFOL;  Surgeon: Midge Minium, MD;  Location: [facility] ENDOSCOPY;  Service: Endoscopy;  Laterality: N/A;   CORONARY STENT INTERVENTION N/A 12/26/2021   Procedure: CORONARY STENT INTERVENTION;  Surgeon: Armando Reichert, MD;  Location: [facility] INVASIVE CV LAB;  Service: Cardiovascular;  Laterality: N/A;   CORONARY ULTRASOUND/IVUS N/A 12/26/2021   Procedure: Intravascular Ultrasound/IVUS;  Surgeon: Armando Reichert, MD;  [facility] INVASIVE CV LAB;  Service: Cardiovascular;  Laterality: N/A;   CYSTOSCOPY WITH INSERTION OF UROLIFT     CYSTOSCOPY WITH INSERTION OF UROLIFT N/A 11/29/2019   Procedure: CYSTOSCOPY WITH INSERTION OF UROLIFT;  Surgeon: Vanna Scotland, MD;  Location: [facility] ORS;  Service: Urology;  Laterality: N/A;   EYE SURGERY  Cataract 2005   HOLEP-LASER ENUCLEATION OF THE PROSTATE WITH MORCELLATION N/A 11/29/2019   Procedure: HOLEP-LASER ENUCLEATION OF THE  PROSTATE;  Surgeon: Vanna Scotland, MD;  Location: [facility] ORS;  Service: Urology;  Laterality: N/A;   KNEE ARTHROSCOPY  2008   LEFT HEART CATH AND CORONARY ANGIOGRAPHY N/A 12/26/2021   Procedure: LEFT HEART CATH AND CORONARY ANGIOGRAPHY;  Surgeon: Armando Reichert, MD;  Location: [facility] INVASIVE CV LAB;  Service: Cardiovascular;  Laterality: N/A;   PITUITARY EXCISION     SHOULDER ARTHROSCOPY Right 07/22/2022   Procedure: ARTHROSCOPY SHOULDER;  Surgeon: Ross Marcus, MD;  Location: [facility] ORS;  Service: Orthopedics;  Laterality: Right;  SUBACROMIAL DECOMPRESSION, OPEN BICEPS TENODESIS   TONSILLECTOMY      Home Medications:  Allergies as of 01/22/2023       Reactions   Penicillins Hives        Medication List        Accurate as of January 22, 2023  7:50 PM. If you have any questions, ask your nurse or doctor.          aspirin EC 81 MG tablet Take 81 mg by mouth daily. Swallow whole.   celecoxib 100 MG capsule Commonly known as: CELEBREX TAKE 1 CAPSULE BY MOUTH 2 TIMES DAILY AS NEEDED.   finasteride 5 MG tablet Commonly known as: PROSCAR 1 tablet every other day What changed: See the new instructions.   losartan 100 MG tablet Commonly known as: COZAAR TAKE 1 TABLET BY MOUTH EVERY DAY   metoprolol succinate 25 MG 24 hr tablet Commonly known as: TOPROL-XL Take 25 mg by mouth every morning. Taking 2 tabs daily   multivitamin with minerals Tabs tablet Take 1 tablet by mouth daily.   rosuvastatin 40 MG tablet Commonly known as: CRESTOR Take 1 tablet (40 mg total) by mouth daily. What changed: when to take this   Zinc 50 MG Tabs Take 50 mg by mouth daily.   zolpidem 5 MG tablet Commonly known as: AMBIEN Take 1 tablet (5 mg total) by mouth at bedtime as needed for sleep.        Allergies:  Allergies  Allergen Reactions   Penicillins Hives    Family History: Family History  Problem Relation Age of Onset   Hypertension Mother    Arthritis Mother     Colon cancer Father    Cancer Father     Social History:  reports that he has never smoked. He has never used smokeless tobacco. He reports that he does not currently use alcohol. He reports that he does not use drugs.   Physical Exam: BP (!) 182/99   Pulse 71   Ht 5\' 7"  (1.702 m)   Wt 174 lb 4 oz (79 kg)   BMI 27.29 kg/m   Constitutional:  Alert and oriented, No acute distress. HEENT: [place] AT, moist mucus membranes.  Trachea midline, no masses. Neurologic: Grossly intact, no focal deficits, moving all 4 extremities. Psychiatric: Normal mood and affect.  Laboratory  Data:  Urinalysis    Component Value Date/Time   COLORURINE AMBER (A) 11/29/2019 2050   APPEARANCEUR Clear 01/22/2023 1515   LABSPEC 1.002 (L) 11/29/2019 2050   PHURINE 8.0 11/29/2019 2050   GLUCOSEU Negative 01/22/2023 1515   HGBUR LARGE (A) 11/29/2019 2050   BILIRUBINUR Negative 01/22/2023 1515   KETONESUR NEGATIVE 11/29/2019 2050   PROTEINUR 1+ (A) 01/22/2023 1515   PROTEINUR 30 (A) 11/29/2019 2050   UROBILINOGEN 0.2 02/24/2019 1012   NITRITE Negative 01/22/2023 1515   NITRITE NEGATIVE 11/29/2019 2050   LEUKOCYTESUR Negative 01/22/2023 1515   LEUKOCYTESUR TRACE (A) 11/29/2019 2050    Lab Results  Component Value Date   LABMICR See below: 01/22/2023   WBCUA 0-5 01/22/2023   LABEPIT 0-10 01/22/2023   BACTERIA Few 01/22/2023      Assessment & Plan:    BPH with urinary frequency -Symptoms are well controlled and stable. He sometimes has some frquency. We talked about whether or not there is a need for an additional medication. -We also discussed behavioral modification. For the time being he will continue to be on Finasteride every other day. Refilled today. -His PSA is low and stable, we elected not to pursue rectal exam today.  Return in about 1 year (around 01/22/2024).    [facility] [address] [place], [place] [address] [phone]

## 2023-01-25 ENCOUNTER — Encounter: Payer: Self-pay | Admitting: Urology

## 2023-02-16 ENCOUNTER — Other Ambulatory Visit: Payer: Self-pay | Admitting: Physician Assistant

## 2023-02-16 DIAGNOSIS — M79641 Pain in right hand: Secondary | ICD-10-CM

## 2023-02-19 NOTE — Telephone Encounter (Signed)
Requested medication (s) are due for refill today:   Yes  Requested medication (s) are on the active medication list:   Yes  Future visit scheduled:   No   Last ordered: 7/18/20224 #90, 0 refills by Debera Lat, [name] for Brett Wilson.  Returned for provider review.   Per notes this is a fill to last her until she saw rheumatology.     Requested Prescriptions  Pending Prescriptions Disp Refills   celecoxib (CELEBREX) 100 MG capsule [Pharmacy Med Name: CELECOXIB 100 MG CAPSULE] 90 capsule 0    Sig: TAKE 1 CAPSULE BY MOUTH 2 TIMES DAILY AS NEEDED.     Analgesics:  COX2 Inhibitors Failed - 02/16/2023  8:44 AM      Failed - Manual Review: Labs are only required if the patient has taken medication for more than 8 weeks.      Passed - HGB in normal range and within 360 days    Hemoglobin  Date Value Ref Range Status  07/10/2022 13.9 13.0 - 17.0 g/dL Final  29/56/2130 86.5 13.0 - 17.7 g/dL Final         Passed - Cr in normal range and within 360 days    Creatinine, Ser  Date Value Ref Range Status  07/10/2022 0.96 0.61 - 1.24 mg/dL Final         Passed - HCT in normal range and within 360 days    HCT  Date Value Ref Range Status  07/10/2022 41.2 39.0 - 52.0 % Final   Hematocrit  Date Value Ref Range Status  09/05/2021 42.9 37.5 - 51.0 % Final         Passed - AST in normal range and within 360 days    AST  Date Value Ref Range Status  07/10/2022 31 15 - 41 U/L Final         Passed - ALT in normal range and within 360 days    ALT  Date Value Ref Range Status  07/10/2022 38 0 - 44 U/L Final         Passed - eGFR is 30 or above and within 360 days    GFR calc Af Amer  Date Value Ref Range Status  11/29/2019 >60 >60 mL/min Final   GFR, Estimated  Date Value Ref Range Status  07/10/2022 >60 >60 mL/min Final    Comment:    (NOTE) Calculated using the CKD-EPI Creatinine Equation (2021)    eGFR  Date Value Ref Range Status  09/05/2021 79 >59 mL/min/1.73 Final          Passed - Patient is not pregnant      Passed - Valid encounter within last 12 months    Recent Outpatient Visits           3 months ago Bilateral hand pain   [place] Brett Ferguson, [name]   3 months ago Positive ANA (antinuclear antibody)   [facility] Brett Ferguson, [name]   3 months ago Left buttock pain   [place] Brett Ferguson, [name]   7 months ago Hordeolum externum of left upper eyelid   [facility] [place], [place], [name]   10 months ago Acute pain of both shoulders   [facility] Brett Wilson, [place]       Future Appointments             Tomorrow Deirdre Evener, MD [facility] [place]   In 10 months Vaillancourt, Lelon Mast, [facility] [place]

## 2023-02-20 ENCOUNTER — Ambulatory Visit: Payer: Medicare HMO | Admitting: Dermatology

## 2023-02-20 ENCOUNTER — Encounter: Payer: Self-pay | Admitting: Dermatology

## 2023-02-20 VITALS — BP 180/96 | HR 63

## 2023-02-20 DIAGNOSIS — L720 Epidermal cyst: Secondary | ICD-10-CM

## 2023-02-20 DIAGNOSIS — L729 Follicular cyst of the skin and subcutaneous tissue, unspecified: Secondary | ICD-10-CM

## 2023-02-20 DIAGNOSIS — Z1283 Encounter for screening for malignant neoplasm of skin: Secondary | ICD-10-CM | POA: Diagnosis not present

## 2023-02-20 DIAGNOSIS — D229 Melanocytic nevi, unspecified: Secondary | ICD-10-CM

## 2023-02-20 DIAGNOSIS — L814 Other melanin hyperpigmentation: Secondary | ICD-10-CM

## 2023-02-20 DIAGNOSIS — W908XXA Exposure to other nonionizing radiation, initial encounter: Secondary | ICD-10-CM | POA: Diagnosis not present

## 2023-02-20 DIAGNOSIS — Z86018 Personal history of other benign neoplasm: Secondary | ICD-10-CM

## 2023-02-20 DIAGNOSIS — D1801 Hemangioma of skin and subcutaneous tissue: Secondary | ICD-10-CM

## 2023-02-20 DIAGNOSIS — L219 Seborrheic dermatitis, unspecified: Secondary | ICD-10-CM

## 2023-02-20 DIAGNOSIS — L821 Other seborrheic keratosis: Secondary | ICD-10-CM

## 2023-02-20 DIAGNOSIS — Z79899 Other long term (current) drug therapy: Secondary | ICD-10-CM

## 2023-02-20 DIAGNOSIS — L578 Other skin changes due to chronic exposure to nonionizing radiation: Secondary | ICD-10-CM

## 2023-02-20 DIAGNOSIS — Z7189 Other specified counseling: Secondary | ICD-10-CM

## 2023-02-20 MED ORDER — MUPIROCIN 2 % EX OINT: 1.0000 | TOPICAL_OINTMENT | Freq: Two times a day (BID) | CUTANEOUS | 0 refills | Status: DC

## 2023-02-20 MED ORDER — KETOCONAZOLE 2 % EX CREA: TOPICAL_CREAM | CUTANEOUS | 5 refills | Status: DC

## 2023-02-20 NOTE — Patient Instructions (Signed)

## 2023-02-20 NOTE — Progress Notes (Signed)
Follow-Up Visit   Subjective  Brett Wilson is a 67 y.o. male who presents for the following: Skin Cancer Screening and Full Body Skin Exam  The patient presents for Total-Body Skin Exam (TBSE) for skin cancer screening and mole check. The patient has spots, moles and lesions to be evaluated, some may be new or changing and the patient may have concern these could be cancer.   The following portions of the chart were reviewed this encounter and updated as appropriate: medications, allergies, medical history  Review of Systems:  No other skin or systemic complaints except as noted in HPI or Assessment and Plan.  Objective  Well appearing patient in no apparent distress; mood and affect are within normal limits.  A full examination was performed including scalp, head, eyes, ears, nose, lips, neck, chest, axillae, abdomen, back, buttocks, bilateral upper extremities, bilateral lower extremities, hands, feet, fingers, toes, fingernails, and toenails. All findings within normal limits unless otherwise noted below.   Relevant physical exam findings are noted in the Assessment and Plan.    Assessment & Plan   SKIN CANCER SCREENING PERFORMED TODAY.  ACTINIC DAMAGE - Chronic condition, secondary to cumulative UV/sun exposure - diffuse scaly erythematous macules with underlying dyspigmentation - Recommend daily broad spectrum sunscreen SPF 30+ to sun-exposed areas, reapply every 2 hours as needed.  - Staying in the shade or wearing long sleeves, sun glasses (UVA+UVB protection) and wide brim hats (4-inch brim around the entire circumference of the hat) are also recommended for sun protection.  - Call for new or changing lesions.  LENTIGINES, SEBORRHEIC KERATOSES, HEMANGIOMAS - Benign normal skin lesions - Benign-appearing - Call for any changes  MELANOCYTIC NEVI - Tan-brown and/or pink-flesh-colored symmetric macules and papules - Benign appearing on exam today - Observation - Call  clinic for new or changing moles - Recommend daily use of broad spectrum spf 30+ sunscreen to sun-exposed areas.   HISTORY OF DYSPLASTIC NEVUS No evidence of recurrence today Recommend regular full body skin exams Recommend daily broad spectrum sunscreen SPF 30+ to sun-exposed areas, reapply every 2 hours as needed.  Call if any new or changing lesions are noted between office visits  Laceration due to trauma -  Start Mupirocin 2% ointment to aa QD.   EPIDERMAL INCLUSION CYST Exam: Subcutaneous nodule at R upper back  Benign-appearing. Exam most consistent with an epidermal inclusion cyst. Discussed that a cyst is a benign growth that can grow over time and sometimes get irritated or inflamed. Recommend observation if it is not bothersome. Discussed option of surgical excision to remove it if it is growing, symptomatic, or other changes noted. Please call for new or changing lesions so they can be evaluated.  SEBORRHEIC DERMATITIS Exam: Pink patches with greasy scale at face  Chronic condition with duration or expected duration over one year. Currently well-controlled.  Seborrheic Dermatitis is a chronic persistent rash characterized by pinkness and scaling most commonly of the mid face but also can occur on the scalp (dandruff), ears; mid chest, mid back and groin.  It tends to be exacerbated by stress and cooler weather.  People who have neurologic disease may experience new onset or exacerbation of existing seborrheic dermatitis.  The condition is not curable but treatable and can be controlled.  Treatment Plan: Continue Ketoconazole 2% cream to face QD.   Return in about 1 year (around 02/20/2024) for TBSE.  Maylene Roes, CMA, am acting as scribe for Armida Sans, MD .  Documentation: I  have reviewed the above documentation for accuracy and completeness, and I agree with the above.  Armida Sans, MD

## 2023-02-20 NOTE — Telephone Encounter (Signed)
Please, advise pt to address need for celebrex with his rheumatology in 03/2023. On behalf of Drubel Maryellen Pile [facility], MMS [facility] [phone] (phone) [phone] (fax)

## 2023-02-24 ENCOUNTER — Encounter: Payer: Self-pay | Admitting: Physician Assistant

## 2023-02-24 ENCOUNTER — Ambulatory Visit (INDEPENDENT_AMBULATORY_CARE_PROVIDER_SITE_OTHER): Payer: Medicare HMO | Admitting: Physician Assistant

## 2023-02-24 VITALS — BP 141/69 | HR 65 | Ht 67.0 in | Wt 171.8 lb

## 2023-02-24 DIAGNOSIS — R102 Pelvic and perineal pain: Secondary | ICD-10-CM | POA: Diagnosis not present

## 2023-02-24 DIAGNOSIS — I10 Essential (primary) hypertension: Secondary | ICD-10-CM

## 2023-02-24 LAB — POCT URINALYSIS DIPSTICK
Bilirubin, UA: NEGATIVE
Blood, UA: NEGATIVE
Glucose, UA: NEGATIVE
Ketones, UA: NEGATIVE
Leukocytes, UA: NEGATIVE
Nitrite, UA: NEGATIVE
Protein, UA: NEGATIVE
Spec Grav, UA: 1.01 (ref 1.010–1.025)
Urobilinogen, UA: 0.2 U/dL
pH, UA: 6 (ref 5.0–8.0)

## 2023-02-24 NOTE — Progress Notes (Signed)
Established patient visit  Patient: Brett Wilson   DOB: 02/10/1956   67 y.o. Male  MRN: [id number] Visit Date: 02/24/2023  Today's healthcare provider: Debera Lat, [name]   Chief Complaint  Patient presents with   Pelvic Pain       Subjective    HPI  Patient reports having pelvic pain. Presents for a little over 10 days. No radiation. Pain is 5/10 and intermittent throughout the day. Patient reports it as a subtle type of pain, feels like constipation. Patient tried taking tylenol but it did not make any difference. Denies having any injury.     Denies having obstructive symptoms, fever, back pain, flank pain, burning with urination, blood in urine, penile discharge/pain, denies having urinary retention.     02/24/2023    3:30 PM 10/29/2022   11:01 AM 09/27/2022    9:37 AM  Depression screen PHQ 2/9  Decreased Interest 0 0 0  Down, Depressed, Hopeless 0 0 0  PHQ - 2 Score 0 0 0  Altered sleeping  0   Tired, decreased energy  0   Change in appetite  0   Feeling bad or failure about yourself   0   Trouble concentrating  0   Moving slowly or fidgety/restless  0   Suicidal thoughts  0   PHQ-9 Score  0   Difficult doing work/chores  Not difficult at all       02/24/2023    3:30 PM  GAD 7 : Generalized Anxiety Score  Nervous, Anxious, on Edge 0  Control/stop worrying 0  Worry too much - different things 0  Trouble relaxing 0  Restless 0  Easily annoyed or irritable 0  Afraid - awful might happen 0  Total GAD 7 Score 0  Anxiety Difficulty Not difficult at all    Medications: Outpatient Medications Prior to Visit  Medication Sig   aspirin EC 81 MG tablet Take 81 mg by mouth daily. Swallow whole.   celecoxib (CELEBREX) 100 MG capsule TAKE 1 CAPSULE BY MOUTH 2 TIMES DAILY AS NEEDED.   finasteride (PROSCAR) 5 MG tablet 1 tablet every other day   ketoconazole (NIZORAL) 2 % cream Apply to face QD.   losartan (COZAAR) 100 MG tablet TAKE 1 TABLET BY MOUTH EVERY DAY    metoprolol succinate (TOPROL-XL) 25 MG 24 hr tablet Take 25 mg by mouth every morning. Taking 2 tabs daily   Multiple Vitamin (MULTIVITAMIN WITH MINERALS) TABS tablet Take 1 tablet by mouth daily.   mupirocin ointment (BACTROBAN) 2 % Apply 1 Application topically 2 (two) times daily. Apply to wounds QD-BID PRN.   rosuvastatin (CRESTOR) 40 MG tablet Take 1 tablet (40 mg total) by mouth daily. (Patient taking differently: Take 40 mg by mouth at bedtime.)   Zinc 50 MG TABS Take 50 mg by mouth daily.   zolpidem (AMBIEN) 5 MG tablet Take 1 tablet (5 mg total) by mouth at bedtime as needed for sleep.   No facility-administered medications prior to visit.    Review of Systems  All other systems reviewed and are negative.  Except see HPI       Objective    BP (!) 141/69 (BP Location: Left Arm, Patient Position: Sitting, Cuff Size: Normal)   Pulse 65   Ht 5\' 7"  (1.702 m)   Wt 171 lb 12.8 oz (77.9 kg)   SpO2 100%   BMI 26.91 kg/m     Physical Exam Constitutional:      General: He is  not in acute distress.    Appearance: Normal appearance. He is not diaphoretic.  HENT:     Head: Normocephalic.  Eyes:     Conjunctiva/sclera: Conjunctivae normal.  Pulmonary:     Effort: Pulmonary effort is normal. No respiratory distress.  Abdominal:     Palpations: Abdomen is soft.  Neurological:     Mental Status: He is alert and oriented to person, place, and time. Mental status is at baseline.      No results found for any visits on 02/24/23.  Assessment & Plan    1. Pelvic pain X 10 days New problem Normal vitals except elevated BP Could be UTI vs prostatitis Pt is in monogamous relationship/ Workup: - POCT Urinalysis Dipstick - Urinalysis, microscopic only - Urine Culture If negative, consider the two-glass test Will reassess after  receiving lab results  2. White coat syndrome with diagnosis of hypertension Bp 141/69 Most likely due to pelvic pain. Advised stress relaxation  techniques, healthy diet and daily exercise including low salt diet.     No follow-ups on file.    The patient was advised to call back or seek an in-person evaluation if the symptoms worsen or if the condition fails to improve as anticipated.  I discussed the assessment and treatment plan with the patient. The patient was provided an opportunity to ask questions and all were answered. The patient agreed with the plan and demonstrated an understanding of the instructions.  I, Debera Lat, [name] have reviewed all documentation for this visit. The documentation on  02/24/23  for the exam, diagnosis, procedures, and orders are all accurate and complete.  Debera Lat, [facility], MMS [facility] [phone] (phone) [phone] (fax)   [facility]

## 2023-02-25 ENCOUNTER — Encounter: Payer: Self-pay | Admitting: Physician Assistant

## 2023-02-25 LAB — URINALYSIS, MICROSCOPIC ONLY
Bacteria, UA: NONE SEEN
Casts: NONE SEEN /LPF
Epithelial Cells (non renal): NONE SEEN /HPF (ref 0–10)
RBC, Urine: NONE SEEN /HPF (ref 0–2)
WBC, UA: NONE SEEN /HPF (ref 0–5)

## 2023-02-26 ENCOUNTER — Encounter: Payer: Self-pay | Admitting: Urology

## 2023-02-26 LAB — URINE CULTURE: Organism ID, Bacteria: NO GROWTH

## 2023-02-27 NOTE — Telephone Encounter (Signed)
Patient advised and appointment made 

## 2023-02-28 ENCOUNTER — Ambulatory Visit: Payer: Medicare HMO | Admitting: Physician Assistant

## 2023-02-28 VITALS — BP 179/80 | HR 78 | Ht 67.0 in | Wt 172.0 lb

## 2023-02-28 DIAGNOSIS — N50811 Right testicular pain: Secondary | ICD-10-CM

## 2023-02-28 LAB — MICROSCOPIC EXAMINATION

## 2023-02-28 LAB — URINALYSIS, COMPLETE
Bilirubin, UA: NEGATIVE
Glucose, UA: NEGATIVE
Ketones, UA: NEGATIVE
Leukocytes,UA: NEGATIVE
Nitrite, UA: NEGATIVE
RBC, UA: NEGATIVE
Specific Gravity, UA: 1.025 (ref 1.005–1.030)
Urobilinogen, Ur: 0.2 mg/dL (ref 0.2–1.0)
pH, UA: 5.5 (ref 5.0–7.5)

## 2023-02-28 NOTE — Progress Notes (Signed)
02/28/2023 11:41 AM   Brett Wilson 24-Oct-1955 [id number]  CC: Chief Complaint  Patient presents with   Testicle Pain   HPI: Brett Wilson is a 67 y.o. male with PMH BPH s/p UroLift and enucleation of the median lobe in 2021 and intermittent right testicular pain/possible recurrent epididymitis who presents today for evaluation of testicular pain.   He saw his PCP 4 days ago for the same.  He described subtle pelvic pain x 10 days.  UA was bland and culture finalized with no growth.  Today he reports 10 days of suprapubic pain with no right testicular ache over the past 3 to 4 days.  He denies any change in his urinary symptoms.  No fever, chills, nausea, vomiting, gross hematuria, flank pain, penile discharge, or testicular swelling.  In-office UA today positive for trace protein; urine microscopy pan negative.  PMH: Past Medical History:  Diagnosis Date   Allergy    Arthritis 03/1999   BPH (benign prostatic hyperplasia)    Coronary artery disease 12/19/2021   a.) cCTA 12/19/2021: Ca score 220 (66th percentile for age/sex match control); >70% pLAD territory (FFR 0.69); b.) LHC/PCI 12/26/2021: 40% p-mRCA, 90% p-mLAD (2.75 x 26 mm Onxy Frontier DES x 1)   Diastolic dysfunction 09/10/2021   a.) stress TTE 09/10/2021: EF> 55%, triv-mild MR/TR/PR, G1DD.   Dysplastic nevus 01/17/2020   Left mid side. Severe atypia with scar, close to margin. exc 03/14/2020   Dysplastic nevus 01/17/2020   Left calf. Moderate atypia, deep margin involved.    Dysplastic nevus 01/17/2020   LUQ abdomen. Mild atypia, limited margins free.    Hepatitis C antibody test positive    Hyperlipidemia    Hypertension    Liver nodule    Long term current use of antithrombotics/antiplatelets    a.) on DAPT (ASA + clopidogrel)   Lung nodule    Nontraumatic rotator cuff tear, right    Pituitary adenoma (HCC)    Pre-diabetes    Thyroid nodule    Typical angina [facility])     Surgical History: Past Surgical  History:  Procedure Laterality Date   CATARACT EXTRACTION Bilateral    COLONOSCOPY WITH PROPOFOL N/A 04/13/2019   Procedure: COLONOSCOPY WITH PROPOFOL;  Surgeon: Midge Minium, MD;  Location: [facility] ENDOSCOPY;  Service: Endoscopy;  Laterality: N/A;   CORONARY STENT INTERVENTION N/A 12/26/2021   Procedure: CORONARY STENT INTERVENTION;  Surgeon: Armando Reichert, MD;  Location: [facility] INVASIVE CV LAB;  Service: Cardiovascular;  Laterality: N/A;   CORONARY ULTRASOUND/IVUS N/A 12/26/2021   Procedure: Intravascular Ultrasound/IVUS;  Surgeon: Armando Reichert, MD;  Location: [facility] INVASIVE CV LAB;  Service: Cardiovascular;  Laterality: N/A;   CYSTOSCOPY WITH INSERTION OF UROLIFT     CYSTOSCOPY WITH INSERTION OF UROLIFT N/A 11/29/2019   Procedure: CYSTOSCOPY WITH INSERTION OF UROLIFT;  Surgeon: Vanna Scotland, MD;  Location: [facility] ORS;  Service: Urology;  Laterality: N/A;   EYE SURGERY  Cataract 2005   HOLEP-LASER ENUCLEATION OF THE PROSTATE WITH MORCELLATION N/A 11/29/2019   Procedure: HOLEP-LASER ENUCLEATION OF THE PROSTATE;  Surgeon: Vanna Scotland, MD;  Location: [facility] ORS;  Service: Urology;  Laterality: N/A;   KNEE ARTHROSCOPY  2008   LEFT HEART CATH AND CORONARY ANGIOGRAPHY N/A 12/26/2021   Procedure: LEFT HEART CATH AND CORONARY ANGIOGRAPHY;  Surgeon: Armando Reichert, MD;  Location: [facility] INVASIVE CV LAB;  Service: Cardiovascular;  Laterality: N/A;   PITUITARY EXCISION     SHOULDER ARTHROSCOPY Right 07/22/2022   Procedure: ARTHROSCOPY SHOULDER;  Surgeon:  Ross Marcus, MD;  Location: [facility] ORS;  Service: Orthopedics;  Laterality: Right;  SUBACROMIAL DECOMPRESSION, OPEN BICEPS TENODESIS   TONSILLECTOMY      Home Medications:  Allergies as of 02/28/2023       Reactions   Penicillins Hives        Medication List        Accurate as of February 28, 2023 11:41 AM. If you have any questions, ask your nurse or doctor.          aspirin EC 81 MG tablet Take 81 mg by mouth  daily. Swallow whole.   celecoxib 100 MG capsule Commonly known as: CELEBREX TAKE 1 CAPSULE BY MOUTH 2 TIMES DAILY AS NEEDED.   finasteride 5 MG tablet Commonly known as: PROSCAR 1 tablet every other day   ketoconazole 2 % cream Commonly known as: NIZORAL Apply to face QD.   losartan 100 MG tablet Commonly known as: COZAAR TAKE 1 TABLET BY MOUTH EVERY DAY   metoprolol succinate 25 MG 24 hr tablet Commonly known as: TOPROL-XL Take 25 mg by mouth every morning. Taking 2 tabs daily   multivitamin with minerals Tabs tablet Take 1 tablet by mouth daily.   mupirocin ointment 2 % Commonly known as: BACTROBAN Apply 1 Application topically 2 (two) times daily. Apply to wounds QD-BID PRN.   rosuvastatin 40 MG tablet Commonly known as: CRESTOR Take 1 tablet (40 mg total) by mouth daily. What changed: when to take this   Zinc 50 MG Tabs Take 50 mg by mouth daily.   zolpidem 5 MG tablet Commonly known as: AMBIEN Take 1 tablet (5 mg total) by mouth at bedtime as needed for sleep.        Allergies:  Allergies  Allergen Reactions   Penicillins Hives    Family History: Family History  Problem Relation Age of Onset   Hypertension Mother    Arthritis Mother    Colon cancer Father    Cancer Father     Social History:   reports that he has never smoked. He has never used smokeless tobacco. He reports that he does not currently use alcohol. He reports that he does not use drugs.  Physical Exam: BP (!) 179/80   Pulse 78   Ht 5\' 7"  (1.702 m)   Wt 172 lb (78 kg)   BMI 26.94 kg/m   Constitutional:  Alert and oriented, no acute distress, nontoxic appearing HEENT: [place], AT Cardiovascular: No clubbing, cyanosis, or edema Respiratory: Normal respiratory effort, no increased work of breathing GU: Bilateral descended testicles.  Bilateral epididymides are prominent but not edematous and nontender.  No masses or nodules.  No scrotal edema, fluctuance, or crepitus.  Normal  sphincter tone. Smooth, nontender, symmetrically enlarged 40+ cc prostate without nodules or induration. Skin: No rashes, bruises or suspicious lesions Neurologic: Grossly intact, no focal deficits, moving all 4 extremities Psychiatric: Normal mood and affect  Laboratory Data: Results for orders placed or performed in visit on 02/28/23  Microscopic Examination   Urine  Result Value Ref Range   WBC, UA 0-5 0 - 5 /hpf   RBC, Urine 0-2 0 - 2 /hpf   Epithelial Cells (non renal) 0-10 0 - 10 /hpf   Mucus, UA Present (A) Not Estab.   Bacteria, UA Few None seen/Few  Urinalysis, Complete  Result Value Ref Range   Specific Gravity, UA 1.025 1.005 - 1.030   pH, UA 5.5 5.0 - 7.5   Color, UA Yellow Yellow  Appearance Ur Clear Clear   Leukocytes,UA Negative Negative   Protein,UA Trace (A) Negative/Trace   Glucose, UA Negative Negative   Ketones, UA Negative Negative   RBC, UA Negative Negative   Bilirubin, UA Negative Negative   Urobilinogen, Ur 0.2 0.2 - 1.0 mg/dL   Nitrite, UA Negative Negative   Microscopic Examination See below:    Assessment & Plan:   1. Pain in right testicle No significant findings on scrotal exam or DRE.  UA is bland.  I explained that I do not have a good explanation for his current symptoms, would recommend treating with a short course of anti-inflammatories and if things do not resolve with this then we can pursue CT imaging per his preference.  He already has Celebrex at home, but takes it intermittently.  I asked him to start taking it twice daily for the next 1 to 2 weeks and reach out to me if his symptoms do not resolve. - Urinalysis, Complete   Return if symptoms worsen or fail to improve.  Carman Ching, [name]  [facility] [place] [address], [address] [place], [place] [address] [phone]

## 2023-03-03 ENCOUNTER — Ambulatory Visit (INDEPENDENT_AMBULATORY_CARE_PROVIDER_SITE_OTHER): Payer: Medicare HMO | Admitting: Physician Assistant

## 2023-03-03 ENCOUNTER — Encounter: Payer: Self-pay | Admitting: Physician Assistant

## 2023-03-03 VITALS — BP 154/83 | HR 76 | Temp 98.6°F | Ht 67.0 in | Wt 172.4 lb

## 2023-03-03 DIAGNOSIS — R1032 Left lower quadrant pain: Secondary | ICD-10-CM

## 2023-03-03 DIAGNOSIS — Z800 Family history of malignant neoplasm of digestive organs: Secondary | ICD-10-CM | POA: Diagnosis not present

## 2023-03-03 NOTE — Progress Notes (Signed)
Celso Amy, [name] [address]  [address]  [place], [place] [address]  Main: [phone]  Fax: [phone]   Gastroenterology Consultation  Referring Provider:     Debera Lat, [name] Primary Care Physician:  Debera Lat, [name] Primary Gastroenterologist:  Celso Amy, [name] / Dr. Midge Minium   Reason for Consultation:     Lower abdominal pain        HPI:   Brett Wilson is a 67 y.o. y/o male referred for consultation & management  by Debera Lat, [name].    Patient states he has had left lower quadrant pain for 2 weeks.  It is constant dull pain and pressure.  4-5 out of 10.  He has 3 bowel movements daily on fiber.  He denies constipation, diarrhea, rectal bleeding, fever, chills, or unintentional weight loss.  He is worried about diverticulitis.  No previous abdominal or pelvic surgeries.  He admits to lifting something heavy 2 weeks ago.  No other specific injury.  Had recent urology evaluation which was unrevealing.  Lab 02/19/23 showed normal LFTs.  Lab 12/2022 showed normal CBC with hemoglobin 14.6.  Colonoscopy 03/2019 by Dr. Servando Snare showed 2 small benign colon mucosal polyps removed from descending colon, nonbleeding internal hemorrhoids, otherwise normal.  5-year repeat was recommended.  His father had colon cancer.  RUQ ultrasound 02/2022 showed no gallstones, normal common bile duct 4 mm, and a small 1.3 cm right liver cyst.  Past Medical History:  Diagnosis Date   Allergy    Arthritis 03/1999   BPH (benign prostatic hyperplasia)    Coronary artery disease 12/19/2021   a.) cCTA 12/19/2021: Ca score 220 (66th percentile for age/sex match control); >70% pLAD territory (FFR 0.69); b.) LHC/PCI 12/26/2021: 40% p-mRCA, 90% p-mLAD (2.75 x 26 mm Onxy Frontier DES x 1)   Diastolic dysfunction 09/10/2021   a.) stress TTE 09/10/2021: EF> 55%, triv-mild MR/TR/PR, G1DD.   Dysplastic nevus 01/17/2020   Left mid side. Severe atypia with scar, close to margin. exc 03/14/2020    Dysplastic nevus 01/17/2020   Left calf. Moderate atypia, deep margin involved.    Dysplastic nevus 01/17/2020   LUQ abdomen. Mild atypia, limited margins free.    Hepatitis C antibody test positive    Hyperlipidemia    Hypertension    Liver nodule    Long term current use of antithrombotics/antiplatelets    a.) on DAPT (ASA + clopidogrel)   Lung nodule    Nontraumatic rotator cuff tear, right    Pituitary adenoma (HCC)    Pre-diabetes    Thyroid nodule    Typical angina [facility])     Past Surgical History:  Procedure Laterality Date   CATARACT EXTRACTION Bilateral    COLONOSCOPY WITH PROPOFOL N/A 04/13/2019   Procedure: COLONOSCOPY WITH PROPOFOL;  Surgeon: Midge Minium, MD;  Location: [facility] ENDOSCOPY;  Service: Endoscopy;  Laterality: N/A;   CORONARY STENT INTERVENTION N/A 12/26/2021   Procedure: CORONARY STENT INTERVENTION;  Surgeon: Armando Reichert, MD;  Location: [facility] INVASIVE CV LAB;  Service: Cardiovascular;  Laterality: N/A;   CORONARY ULTRASOUND/IVUS N/A 12/26/2021   Procedure: Intravascular Ultrasound/IVUS;  Surgeon: Armando Reichert, MD;  Location: [facility] INVASIVE CV LAB;  Service: Cardiovascular;  Laterality: N/A;   CYSTOSCOPY WITH INSERTION OF UROLIFT     CYSTOSCOPY WITH INSERTION OF UROLIFT N/A 11/29/2019   Procedure: CYSTOSCOPY WITH INSERTION OF UROLIFT;  Surgeon: Vanna Scotland, MD;  Location: [facility] ORS;  Service: Urology;  Laterality: N/A;   EYE SURGERY  Cataract  2005   HOLEP-LASER ENUCLEATION OF THE PROSTATE WITH MORCELLATION N/A 11/29/2019   Procedure: HOLEP-LASER ENUCLEATION OF THE PROSTATE;  Surgeon: Vanna Scotland, MD;  Location: [facility] ORS;  Service: Urology;  Laterality: N/A;   KNEE ARTHROSCOPY  2008   LEFT HEART CATH AND CORONARY ANGIOGRAPHY N/A 12/26/2021   Procedure: LEFT HEART CATH AND CORONARY ANGIOGRAPHY;  Surgeon: Armando Reichert, MD;  Location: [facility] INVASIVE CV LAB;  Service: Cardiovascular;  Laterality: N/A;   PITUITARY EXCISION     SHOULDER  ARTHROSCOPY Right 07/22/2022   Procedure: ARTHROSCOPY SHOULDER;  Surgeon: Ross Marcus, MD;  Location: [facility] ORS;  Service: Orthopedics;  Laterality: Right;  SUBACROMIAL DECOMPRESSION, OPEN BICEPS TENODESIS   TONSILLECTOMY      Prior to Admission medications   Medication Sig Start Date End Date Taking? Authorizing Provider  aspirin EC 81 MG tablet Take 81 mg by mouth daily. Swallow whole.   Yes [provider]  celecoxib (CELEBREX) 100 MG capsule TAKE 1 CAPSULE BY MOUTH 2 TIMES DAILY AS NEEDED. 02/20/23  Yes Ostwalt, Edmon Crape, [name]  finasteride (PROSCAR) 5 MG tablet 1 tablet every other day 01/22/23  Yes Vanna Scotland, MD  ketoconazole (NIZORAL) 2 % cream Apply to face QD. 02/20/23  Yes Deirdre Evener, MD  losartan (COZAAR) 100 MG tablet TAKE 1 TABLET BY MOUTH EVERY DAY 12/30/22  Yes Ostwalt, Janna, [name]  metoprolol succinate (TOPROL-XL) 25 MG 24 hr tablet Take 25 mg by mouth every morning. Taking 2 tabs daily   Yes [provider]  Multiple Vitamin (MULTIVITAMIN WITH MINERALS) TABS tablet Take 1 tablet by mouth daily.   Yes [provider]  rosuvastatin (CRESTOR) 40 MG tablet Take 1 tablet (40 mg total) by mouth daily. Patient taking differently: Take 40 mg by mouth at bedtime. 12/26/21  Yes Armando Reichert, MD  Zinc 50 MG TABS Take 50 mg by mouth daily.   Yes [provider]  zolpidem (AMBIEN) 5 MG tablet Take 1 tablet (5 mg total) by mouth at bedtime as needed for sleep. 12/02/22  Yes [name], [name]    Family History  Problem Relation Age of Onset   Hypertension Mother    Arthritis Mother    Colon cancer Father    Cancer Father      Social History   Tobacco Use   Smoking status: Never   Smokeless tobacco: Never  Vaping Use   Vaping status: Never Used  Substance Use Topics   Alcohol use: Not Currently   Drug use: Never    Allergies as of 03/03/2023 - Review Complete 03/03/2023  Allergen Reaction Noted   Penicillins Hives  02/24/2019    Review of Systems:    All systems reviewed and negative except where noted in HPI.   Physical Exam:  BP (!) 154/83   Pulse 76   Temp 98.6 F (37 C)   Ht 5\' 7"  (1.702 m)   Wt 172 lb 6.4 oz (78.2 kg)   BMI 27.00 kg/m  No LMP for male patient.  General:   Alert,  Well-developed, well-nourished, pleasant and cooperative in NAD Lungs:  Respirations even and unlabored.  Clear throughout to auscultation.   No wheezes, crackles, or rhonchi. No acute distress. Heart:  Regular rate and rhythm; no murmurs, clicks, rubs, or gallops. Abdomen:  Normal bowel sounds.  No bruits.  Soft, and non-distended without masses, hepatosplenomegaly or hernias noted.  Mild to moderate LLQ Tenderness.  Rest of abdomen is not tender.  No guarding or rebound  tenderness.    Neurologic:  Alert and oriented x3;  grossly normal neurologically. Psych:  Alert and cooperative. Normal mood and affect.  Imaging Studies: No results found.  Assessment and Plan:   Esten Sthilaire is a 67 y.o. y/o male has been referred for persistent LLQ abdominal pain for 2 weeks.  Differential includes diverticulitis versus musculoskeletal pain.  Recent urology evaluation was unrevealing.  He has not had a CT scan.  LLQ abdominal pain; evaluate for diverticulitis  Ordering CT abdomen pelvis with contrast  Family history of colon cancer: Father 5-year repeat colonoscopy will be due next year 03/2024.  Follow up as needed based on CT results and symptoms.  Celso Amy, [name]

## 2023-03-03 NOTE — Patient Instructions (Signed)
CT scheduled at [facility]patient [facility] .  [address] [place]   Follow up as needed.

## 2023-03-07 ENCOUNTER — Ambulatory Visit
Admission: RE | Admit: 2023-03-07 | Discharge: 2023-03-07 | Disposition: A | Discharge status: Home / Self Care | Payer: Medicare HMO | Source: Ambulatory Visit | Attending: Physician Assistant | Admitting: Physician Assistant

## 2023-03-07 DIAGNOSIS — R1032 Left lower quadrant pain: Secondary | ICD-10-CM | POA: Insufficient documentation

## 2023-03-07 MED ORDER — BARIUM SULFATE 2 % PO SUSP
450.0000 mL | ORAL | Status: AC
  Administered 2023-03-07 (×2): 450 mL via ORAL

## 2023-03-07 MED ORDER — IOHEXOL 300 MG/ML  SOLN
85.0000 mL | Freq: Once | INTRAMUSCULAR | Status: AC | PRN
  Administered 2023-03-07: 85 mL via INTRAVENOUS

## 2023-03-19 ENCOUNTER — Ambulatory Visit: Payer: Medicare HMO | Admitting: Physician Assistant

## 2023-03-25 ENCOUNTER — Ambulatory Visit: Payer: Medicare HMO | Admitting: Physician Assistant

## 2023-04-02 NOTE — Progress Notes (Unsigned)
Established patient visit  Patient: Brett Wilson   DOB: 1956/05/20   67 y.o. Male  MRN: [id number] Visit Date: 04/03/2023  Today's healthcare provider: Debera Lat, [name]   No chief complaint on file.  Subjective    HPI  *** Discussed the use of AI scribe software for clinical note transcription with the patient, who gave verbal consent to proceed.  History of Present Illness               02/24/2023    3:30 PM 10/29/2022   11:01 AM 09/27/2022    9:37 AM  Depression screen PHQ 2/9  Decreased Interest 0 0 0  Down, Depressed, Hopeless 0 0 0  PHQ - 2 Score 0 0 0  Altered sleeping  0   Tired, decreased energy  0   Change in appetite  0   Feeling bad or failure about yourself   0   Trouble concentrating  0   Moving slowly or fidgety/restless  0   Suicidal thoughts  0   PHQ-9 Score  0   Difficult doing work/chores  Not difficult at all       02/24/2023    3:30 PM  GAD 7 : Generalized Anxiety Score  Nervous, Anxious, on Edge 0  Control/stop worrying 0  Worry too much - different things 0  Trouble relaxing 0  Restless 0  Easily annoyed or irritable 0  Afraid - awful might happen 0  Total GAD 7 Score 0  Anxiety Difficulty Not difficult at all    Medications: Outpatient Medications Prior to Visit  Medication Sig   aspirin EC 81 MG tablet Take 81 mg by mouth daily. Swallow whole.   celecoxib (CELEBREX) 100 MG capsule TAKE 1 CAPSULE BY MOUTH 2 TIMES DAILY AS NEEDED.   finasteride (PROSCAR) 5 MG tablet 1 tablet every other day   ketoconazole (NIZORAL) 2 % cream Apply to face QD.   losartan (COZAAR) 100 MG tablet TAKE 1 TABLET BY MOUTH EVERY DAY   metoprolol succinate (TOPROL-XL) 25 MG 24 hr tablet Take 25 mg by mouth every morning. Taking 2 tabs daily   Multiple Vitamin (MULTIVITAMIN WITH MINERALS) TABS tablet Take 1 tablet by mouth daily.   rosuvastatin (CRESTOR) 40 MG tablet Take 1 tablet (40 mg total) by mouth daily. (Patient taking differently: Take 40 mg by mouth  at bedtime.)   Zinc 50 MG TABS Take 50 mg by mouth daily.   zolpidem (AMBIEN) 5 MG tablet Take 1 tablet (5 mg total) by mouth at bedtime as needed for sleep.   No facility-administered medications prior to visit.    Review of Systems  All other systems reviewed and are negative.  Except see HPI   {Insert previous labs (optional):23779} {See past labs  Heme  Chem  Endocrine  Serology  Results Review (optional):1}   Objective    There were no vitals taken for this visit. {Insert last BP/Wt (optional):23777}{See vitals history (optional):1}   Physical Exam Vitals reviewed.  Constitutional:      General: He is not in acute distress.    Appearance: Normal appearance. He is not diaphoretic.  HENT:     Head: Normocephalic and atraumatic.  Eyes:     General: No scleral icterus.    Conjunctiva/sclera: Conjunctivae normal.  Cardiovascular:     Rate and Rhythm: Normal rate and regular rhythm.     Pulses: Normal pulses.     Heart sounds: Normal heart sounds. No murmur heard. Pulmonary:  Effort: Pulmonary effort is normal. No respiratory distress.     Breath sounds: Normal breath sounds. No wheezing or rhonchi.  Musculoskeletal:     Cervical back: Neck supple.     Right lower leg: No edema.     Left lower leg: No edema.  Lymphadenopathy:     Cervical: No cervical adenopathy.  Skin:    General: Skin is warm and dry.     Findings: No rash.  Neurological:     Mental Status: He is alert and oriented to person, place, and time. Mental status is at baseline.  Psychiatric:        Mood and Affect: Mood normal.        Behavior: Behavior normal.      No results found for any visits on 04/03/23.  Assessment & Plan    *** Assessment and Plan              No follow-ups on file.     The patient was advised to call back or seek an in-person evaluation if the symptoms worsen or if the condition fails to improve as anticipated.  I discussed the assessment and treatment  plan with the patient. The patient was provided an opportunity to ask questions and all were answered. The patient agreed with the plan and demonstrated an understanding of the instructions.  I, Debera Lat, [name] have reviewed all documentation for this visit. The documentation on  @10 /17/24  for the exam, diagnosis, procedures, and orders are all accurate and complete.  Debera Lat, [facility], MMS [facility] [phone] (phone) [phone] (fax)  [facility]

## 2023-04-03 ENCOUNTER — Ambulatory Visit (INDEPENDENT_AMBULATORY_CARE_PROVIDER_SITE_OTHER): Payer: Medicare HMO | Admitting: Physician Assistant

## 2023-04-03 ENCOUNTER — Encounter: Payer: Self-pay | Admitting: Physician Assistant

## 2023-04-03 VITALS — BP 156/91 | HR 55 | Temp 98.8°F | Resp 16 | Ht 67.0 in | Wt 173.3 lb

## 2023-04-03 DIAGNOSIS — E785 Hyperlipidemia, unspecified: Secondary | ICD-10-CM

## 2023-04-03 DIAGNOSIS — R102 Pelvic and perineal pain: Secondary | ICD-10-CM | POA: Diagnosis not present

## 2023-04-03 DIAGNOSIS — I10 Essential (primary) hypertension: Secondary | ICD-10-CM | POA: Diagnosis not present

## 2023-04-03 DIAGNOSIS — R7303 Prediabetes: Secondary | ICD-10-CM

## 2023-04-03 DIAGNOSIS — E042 Nontoxic multinodular goiter: Secondary | ICD-10-CM

## 2023-04-03 DIAGNOSIS — G4709 Other insomnia: Secondary | ICD-10-CM | POA: Diagnosis not present

## 2023-04-03 DIAGNOSIS — Z86018 Personal history of other benign neoplasm: Secondary | ICD-10-CM

## 2023-04-03 DIAGNOSIS — M158 Other polyosteoarthritis: Secondary | ICD-10-CM

## 2023-04-05 LAB — TSH+FREE T4
Free T4: 1.24 ng/dL (ref 0.82–1.77)
TSH: 1.6 u[IU]/mL (ref 0.450–4.500)

## 2023-04-05 LAB — HEMOGLOBIN A1C
Est. average glucose Bld gHb Est-mCnc: 126 mg/dL
Hgb A1c MFr Bld: 6 % — ABNORMAL HIGH (ref 4.8–5.6)

## 2023-04-07 ENCOUNTER — Other Ambulatory Visit: Payer: Self-pay | Admitting: Physician Assistant

## 2023-04-07 ENCOUNTER — Encounter: Payer: Self-pay | Admitting: Physician Assistant

## 2023-04-07 DIAGNOSIS — M79641 Pain in right hand: Secondary | ICD-10-CM

## 2023-05-02 ENCOUNTER — Other Ambulatory Visit: Payer: Self-pay | Admitting: Physician Assistant

## 2023-05-02 DIAGNOSIS — I10 Essential (primary) hypertension: Secondary | ICD-10-CM

## 2023-05-02 NOTE — Telephone Encounter (Signed)
Requested Prescriptions  Pending Prescriptions Disp Refills   losartan (COZAAR) 100 MG tablet [Pharmacy Med Name: LOSARTAN POTASSIUM 100 MG TAB] 90 tablet 0    Sig: TAKE 1 TABLET BY MOUTH EVERY DAY     Cardiovascular:  Angiotensin Receptor Blockers Failed - 05/02/2023  1:39 AM      Failed - Cr in normal range and within 180 days    Creatinine, Ser  Date Value Ref Range Status  07/10/2022 0.96 0.61 - 1.24 mg/dL Final         Failed - K in normal range and within 180 days    Potassium  Date Value Ref Range Status  07/10/2022 3.8 3.5 - 5.1 mmol/L Final         Failed - Last BP in normal range    BP Readings from Last 1 Encounters:  04/03/23 (!) 156/91         Passed - Patient is not pregnant      Passed - Valid encounter within last 6 months    Recent Outpatient Visits           4 weeks ago White coat syndrome with diagnosis of hypertension   [place] [facility] [place], [place], [name]   2 months ago Pelvic pain   [place] [facility] [place], [place], [name]   5 months ago Bilateral hand pain   [place] [facility] [place], [place], [name]   5 months ago Positive ANA (antinuclear antibody)   [facility] [name], [name]   6 months ago Left buttock pain   [place] [facility] [name], [name]       Future Appointments             In 8 months Carman Ching, [name] [facility] [place]   In 10 months Deirdre Evener, MD [facility]

## 2023-07-17 ENCOUNTER — Encounter: Payer: Self-pay | Admitting: Physician Assistant

## 2023-07-17 DIAGNOSIS — G4701 Insomnia due to medical condition: Secondary | ICD-10-CM

## 2023-07-24 MED ORDER — TRAZODONE HCL 50 MG PO TABS
50.0000 mg | ORAL_TABLET | Freq: Every day | ORAL | 0 refills | Status: DC
Start: 2023-07-24 — End: 2023-08-19

## 2023-08-17 ENCOUNTER — Other Ambulatory Visit: Payer: Self-pay | Admitting: Physician Assistant

## 2023-08-17 DIAGNOSIS — I10 Essential (primary) hypertension: Secondary | ICD-10-CM

## 2023-08-18 ENCOUNTER — Other Ambulatory Visit: Payer: Self-pay | Admitting: Physician Assistant

## 2023-08-18 DIAGNOSIS — G4701 Insomnia due to medical condition: Secondary | ICD-10-CM

## 2023-08-18 NOTE — Telephone Encounter (Signed)
 Requested medications are due for refill today.  yes  Requested medications are on the active medications list.  yes  Last refill. 05/02/2023 #90 0 rf  Future visit scheduled.   no  Notes to clinic.  Labs are expired.    Requested Prescriptions  Pending Prescriptions Disp Refills   losartan (COZAAR) 100 MG tablet [Pharmacy Med Name: LOSARTAN POTASSIUM 100 MG TAB] 90 tablet 0    Sig: TAKE 1 TABLET BY MOUTH EVERY DAY     Cardiovascular:  Angiotensin Receptor Blockers Failed - 08/18/2023  5:06 PM      Failed - Cr in normal range and within 180 days    Creatinine, Ser  Date Value Ref Range Status  07/10/2022 0.96 0.61 - 1.24 mg/dL Final         Failed - K in normal range and within 180 days    Potassium  Date Value Ref Range Status  07/10/2022 3.8 3.5 - 5.1 mmol/L Final         Failed - Last BP in normal range    BP Readings from Last 1 Encounters:  04/03/23 (!) 156/91         Passed - Patient is not pregnant      Passed - Valid encounter within last 6 months    Recent Outpatient Visits           4 months ago White coat syndrome with diagnosis of hypertension   [facility] [place], [place], [name]   5 months ago Pelvic pain   [facility] [place], [place], [place]   9 months ago Bilateral hand pain   [place] [facility] [place], [place], [name]   9 months ago Positive ANA (antinuclear antibody)   [facility] [name], [name]   9 months ago Left buttock pain   [place] [name], [name]       Future Appointments             In 4 months Carman Ching, [name] [facility] [place]   In 6 months Deirdre Evener, MD [facility]

## 2023-08-19 NOTE — Telephone Encounter (Signed)
 Requested Prescriptions  Pending Prescriptions Disp Refills   traZODone (DESYREL) 50 MG tablet [Pharmacy Med Name: TRAZODONE 50 MG TABLET] 90 tablet 0    Sig: START TAKING 1/2 TABLET AT BEDTIME AND INCREASE TO 1 TABLET IF NEEDED     Psychiatry: Antidepressants - Serotonin Modulator Passed - 08/19/2023  1:59 PM      Passed - Valid encounter within last 6 months    Recent Outpatient Visits           4 months ago White coat syndrome with diagnosis of hypertension   [place] [facility] [place], [place], [name]   5 months ago Pelvic pain   [place] [facility] [place], [place], [place]   9 months ago Bilateral hand pain   [place] [facility] [name], [name]   9 months ago Positive ANA (antinuclear antibody)   [facility] [name], [name]   9 months ago Left buttock pain   [place] [facility] [name], [name]       Future Appointments             In 4 months Carman Ching, [name] [facility] [place]   In 6 months Deirdre Evener, MD [facility]

## 2023-08-21 ENCOUNTER — Encounter: Payer: Self-pay | Admitting: Physician Assistant

## 2023-09-19 ENCOUNTER — Ambulatory Visit: Admitting: Physician Assistant

## 2023-09-19 ENCOUNTER — Encounter: Payer: Self-pay | Admitting: Physician Assistant

## 2023-09-19 VITALS — BP 143/85 | HR 80 | Resp 16 | Ht 67.0 in | Wt 171.2 lb

## 2023-09-19 DIAGNOSIS — G4709 Other insomnia: Secondary | ICD-10-CM

## 2023-09-19 DIAGNOSIS — M158 Other polyosteoarthritis: Secondary | ICD-10-CM | POA: Diagnosis not present

## 2023-09-19 DIAGNOSIS — R7303 Prediabetes: Secondary | ICD-10-CM | POA: Diagnosis not present

## 2023-09-19 DIAGNOSIS — E042 Nontoxic multinodular goiter: Secondary | ICD-10-CM

## 2023-09-19 DIAGNOSIS — I10 Essential (primary) hypertension: Secondary | ICD-10-CM

## 2023-09-19 DIAGNOSIS — Z125 Encounter for screening for malignant neoplasm of prostate: Secondary | ICD-10-CM

## 2023-09-19 DIAGNOSIS — R5383 Other fatigue: Secondary | ICD-10-CM

## 2023-09-19 DIAGNOSIS — E785 Hyperlipidemia, unspecified: Secondary | ICD-10-CM

## 2023-09-19 NOTE — Progress Notes (Signed)
 Established patient visit  Patient: Brett Wilson   DOB: 09-Oct-1955   68 y.o. Male  MRN: [id number] Visit Date: 09/19/2023  Today's healthcare provider: Debera Lat, [name]   Chief Complaint  Patient presents with   Follow-up    6 month f/u  Labs    Subjective     Hypertension, follow-up  BP Readings from Last 3 Encounters:  09/19/23 (!) 143/85  04/03/23 (!) 156/91  03/03/23 (!) 154/83   Wt Readings from Last 3 Encounters:  09/19/23 171 lb 3.2 oz (77.7 kg)  04/03/23 173 lb 4.8 oz (78.6 kg)  03/03/23 172 lb 6.4 oz (78.2 kg)     He was last seen for hypertension 6 months ago.  BP at that visit was see above. Management since that visit includes losartan 100 metoprolol 25.  He reports fair compliance with treatment. He is not having side effects.   Symptoms: No chest pain No chest pressure  No palpitations No syncope  No dyspnea No orthopnea  No paroxysmal nocturnal dyspnea No lower extremity edema   Pertinent labs Lab Results  Component Value Date   CHOL 145 09/19/2023   HDL 50 09/19/2023   LDLCALC 79 09/19/2023   TRIG 83 09/19/2023   CHOLHDL 2.9 09/19/2023   Lab Results  Component Value Date   NA 142 09/19/2023   K 4.5 09/19/2023   CREATININE 1.15 09/19/2023   EGFR 70 09/19/2023   GLUCOSE 98 09/19/2023   TSH 1.070 09/19/2023     The 10-year ASCVD risk score (Arnett DK, et al., 2019) is: 16.9%  ---------------------------------------------------------------------------------------------------      09/19/2023    9:57 AM 04/03/2023   10:54 AM 02/24/2023    3:30 PM  Depression screen PHQ 2/9  Decreased Interest 0 0 0  Down, Depressed, Hopeless 0 0 0  PHQ - 2 Score 0 0 0  Altered sleeping 1    Tired, decreased energy 1    Change in appetite 0    Feeling bad or failure about yourself  0    Trouble concentrating 0    Moving slowly or fidgety/restless 0    Suicidal thoughts 0    PHQ-9 Score 2    Difficult doing work/chores Somewhat difficult         09/19/2023    9:58 AM 02/24/2023    3:30 PM  GAD 7 : Generalized Anxiety Score  Nervous, Anxious, on Edge 0 0  Control/stop worrying 0 0  Worry too much - different things 0 0  Trouble relaxing 0 0  Restless 0 0  Easily annoyed or irritable 0 0  Afraid - awful might happen 0 0  Total GAD 7 Score 0 0  Anxiety Difficulty Not difficult at all Not difficult at all    Medications: Outpatient Medications Prior to Visit  Medication Sig   aspirin EC 81 MG tablet Take 81 mg by mouth daily. Swallow whole.   celecoxib (CELEBREX) 100 MG capsule TAKE 1 CAPSULE BY MOUTH 2 TIMES DAILY AS NEEDED.   finasteride (PROSCAR) 5 MG tablet 1 tablet every other day   ketoconazole (NIZORAL) 2 % cream Apply to face QD.   losartan (COZAAR) 100 MG tablet TAKE 1 TABLET BY MOUTH EVERY DAY   metoprolol succinate (TOPROL-XL) 25 MG 24 hr tablet Take 25 mg by mouth every morning. Taking 2 tabs daily   Multiple Vitamin (MULTIVITAMIN WITH MINERALS) TABS tablet Take 1 tablet by mouth daily.   rosuvastatin (CRESTOR) 40 MG tablet Take 1 tablet (40  mg total) by mouth daily. (Patient taking differently: Take 40 mg by mouth at bedtime.)   traZODone (DESYREL) 50 MG tablet START TAKING 1/2 TABLET AT BEDTIME AND INCREASE TO 1 TABLET IF NEEDED   Zinc 50 MG TABS Take 50 mg by mouth daily.   zolpidem (AMBIEN) 5 MG tablet Take 1 tablet (5 mg total) by mouth at bedtime as needed for sleep.   No facility-administered medications prior to visit.    Review of Systems All negative Except see HPI       Objective    BP (!) 143/85 (BP Location: Right Arm, Patient Position: Sitting)   Pulse 80   Resp 16   Ht 5\' 7"  (1.702 m)   Wt 171 lb 3.2 oz (77.7 kg)   SpO2 100%   BMI 26.81 kg/m     Physical Exam Vitals reviewed.  Constitutional:      General: He is not in acute distress.    Appearance: Normal appearance. He is not diaphoretic.  HENT:     Head: Normocephalic and atraumatic.  Eyes:     General: No scleral icterus.     Conjunctiva/sclera: Conjunctivae normal.  Cardiovascular:     Rate and Rhythm: Normal rate and regular rhythm.     Pulses: Normal pulses.     Heart sounds: Normal heart sounds. No murmur heard. Pulmonary:     Effort: Pulmonary effort is normal. No respiratory distress.     Breath sounds: Normal breath sounds. No wheezing or rhonchi.  Musculoskeletal:     Cervical back: Neck supple.     Right lower leg: No edema.     Left lower leg: No edema.  Lymphadenopathy:     Cervical: No cervical adenopathy.  Skin:    General: Skin is warm and dry.     Findings: No rash.  Neurological:     Mental Status: He is alert and oriented to person, place, and time. Mental status is at baseline.  Psychiatric:        Mood and Affect: Mood normal.        Behavior: Behavior normal.      No results found for any visits on 09/19/23.      Assessment & Plan   hypertension  Chronic, unstable ordered - CBC with Differential/Platelet - Comprehensive metabolic panel with GFR - T4, free - Hemoglobin A1c - Lipid panel Continue taking losartan 100, metoprolol 25 Advised low sodium intake, regular exercise Will reassess after  receiving lab results Will FU  Hyperlipidemia, unspecified hyperlipidemia type Chronic and stable - CBC with Differential/Platelet - Comprehensive metabolic panel with GFR - T4, free - TSH - Hemoglobin A1c - Lipid panel Continue rosuvastatin 40mg , diet and exercise Will follow-up  Prediabetes Chronic and stable A1c 6.0 5 month ago - CBC with Differential/Platelet - Comprehensive metabolic panel with GFR - T4, free - TSH - Hemoglobin A1c - Lipid panel Continue low carb diet and exercise Will follow-up  Other osteoarthritis involving multiple joints Chronic and stable Symptomatic treatment and exercise advised Will follow-up  Multiple thyroid nodules Chronic and stable ordered - T4, free - TSH - US THYROID; Future Will follow-up  Other  insomnia Chronic and stable Continue taking trazodone 50/doing well on trazodone Will follow-up  Other fatigue Chronic  Ordered Cbc, cmp, A1c, tsh, lipids - Iron, TIBC and Ferritin Panel Will monitor  Prostate cancer screening - PSA Total (Reflex To Free)   No orders of the defined types were placed in this encounter.  No follow-ups on file.   The patient was advised to call back or seek an in-person evaluation if the symptoms worsen or if the condition fails to improve as anticipated.  I discussed the assessment and treatment plan with the patient. The patient was provided an opportunity to ask questions and all were answered. The patient agreed with the plan and demonstrated an understanding of the instructions.  I, Debera Lat, [name] have reviewed all documentation for this visit. The documentation on 09/19/2023  for the exam, diagnosis, procedures, and orders are all accurate and complete.  Debera Lat, [facility], MMS [facility] [phone] (phone) [phone] (fax)  [facility]

## 2023-09-20 LAB — IRON,TIBC AND FERRITIN PANEL
Ferritin: 161 ng/mL (ref 30–400)
Iron Saturation: 30 % (ref 15–55)
Iron: 97 ug/dL (ref 38–169)
Total Iron Binding Capacity: 321 ug/dL (ref 250–450)
UIBC: 224 ug/dL (ref 111–343)

## 2023-09-20 LAB — CBC WITH DIFFERENTIAL/PLATELET
Basophils Absolute: 0.1 10*3/uL (ref 0.0–0.2)
Basos: 1 %
EOS (ABSOLUTE): 0.1 10*3/uL (ref 0.0–0.4)
Eos: 1 %
Hematocrit: 44.5 % (ref 37.5–51.0)
Hemoglobin: 14.7 g/dL (ref 13.0–17.7)
Immature Grans (Abs): 0.1 10*3/uL (ref 0.0–0.1)
Immature Granulocytes: 1 %
Lymphocytes Absolute: 1.6 10*3/uL (ref 0.7–3.1)
Lymphs: 16 %
MCH: 31.9 pg (ref 26.6–33.0)
MCHC: 33 g/dL (ref 31.5–35.7)
MCV: 97 fL (ref 79–97)
Monocytes Absolute: 0.8 10*3/uL (ref 0.1–0.9)
Monocytes: 8 %
Neutrophils Absolute: 7.5 10*3/uL — ABNORMAL HIGH (ref 1.4–7.0)
Neutrophils: 73 %
Platelets: 267 10*3/uL (ref 150–450)
RBC: 4.61 x10E6/uL (ref 4.14–5.80)
RDW: 12.4 % (ref 11.6–15.4)
WBC: 10 10*3/uL (ref 3.4–10.8)

## 2023-09-20 LAB — LIPID PANEL
Chol/HDL Ratio: 2.9 ratio (ref 0.0–5.0)
Cholesterol, Total: 145 mg/dL (ref 100–199)
HDL: 50 mg/dL (ref >39)
LDL Chol Calc (NIH): 79 mg/dL (ref 0–99)
Triglycerides: 83 mg/dL (ref 0–149)
VLDL Cholesterol Cal: 16 mg/dL (ref 5–40)

## 2023-09-20 LAB — COMPREHENSIVE METABOLIC PANEL WITH GFR
ALT: 29 IU/L (ref 0–44)
AST: 32 IU/L (ref 0–40)
Albumin: 4.7 g/dL (ref 3.9–4.9)
Alkaline Phosphatase: 102 IU/L (ref 44–121)
BUN/Creatinine Ratio: 13 (ref 10–24)
BUN: 15 mg/dL (ref 8–27)
Bilirubin Total: 0.9 mg/dL (ref 0.0–1.2)
CO2: 26 mmol/L (ref 20–29)
Calcium: 10.2 mg/dL (ref 8.6–10.2)
Chloride: 99 mmol/L (ref 96–106)
Creatinine, Ser: 1.15 mg/dL (ref 0.76–1.27)
Globulin, Total: 2.3 g/dL (ref 1.5–4.5)
Glucose: 98 mg/dL (ref 70–99)
Potassium: 4.5 mmol/L (ref 3.5–5.2)
Sodium: 142 mmol/L (ref 134–144)
Total Protein: 7 g/dL (ref 6.0–8.5)
eGFR: 70 mL/min/{1.73_m2} (ref >59)

## 2023-09-20 LAB — HEMOGLOBIN A1C
Est. average glucose Bld gHb Est-mCnc: 117 mg/dL
Hgb A1c MFr Bld: 5.7 % — ABNORMAL HIGH (ref 4.8–5.6)

## 2023-09-20 LAB — T4, FREE: Free T4: 1.16 ng/dL (ref 0.82–1.77)

## 2023-09-20 LAB — TSH: TSH: 1.07 u[IU]/mL (ref 0.450–4.500)

## 2023-09-20 LAB — PSA TOTAL (REFLEX TO FREE): Prostate Specific Ag, Serum: 1.8 ng/mL (ref 0.0–4.0)

## 2023-09-22 ENCOUNTER — Encounter: Payer: Self-pay | Admitting: Physician Assistant

## 2023-09-23 ENCOUNTER — Encounter: Payer: Self-pay | Admitting: Urology

## 2023-09-23 NOTE — Telephone Encounter (Signed)
 Duplicate message.

## 2023-09-25 NOTE — Telephone Encounter (Signed)
 Appointments cancelled.

## 2023-10-13 ENCOUNTER — Ambulatory Visit
Admission: RE | Admit: 2023-10-13 | Discharge: 2023-10-13 | Disposition: A | Discharge status: Home / Self Care | Source: Ambulatory Visit | Attending: Physician Assistant | Admitting: Physician Assistant

## 2023-10-13 DIAGNOSIS — E042 Nontoxic multinodular goiter: Secondary | ICD-10-CM | POA: Diagnosis present

## 2023-10-20 ENCOUNTER — Encounter: Payer: Self-pay | Admitting: Physician Assistant

## 2023-11-06 ENCOUNTER — Ambulatory Visit (INDEPENDENT_AMBULATORY_CARE_PROVIDER_SITE_OTHER): Admitting: Physician Assistant

## 2023-11-06 ENCOUNTER — Encounter: Payer: Self-pay | Admitting: Physician Assistant

## 2023-11-06 VITALS — BP 134/76 | HR 85 | Resp 16 | Ht 67.0 in | Wt 170.0 lb

## 2023-11-06 DIAGNOSIS — R7303 Prediabetes: Secondary | ICD-10-CM

## 2023-11-06 DIAGNOSIS — Z0001 Encounter for general adult medical examination with abnormal findings: Secondary | ICD-10-CM | POA: Diagnosis not present

## 2023-11-06 DIAGNOSIS — Z0000 Encounter for general adult medical examination without abnormal findings: Secondary | ICD-10-CM

## 2023-11-06 DIAGNOSIS — R21 Rash and other nonspecific skin eruption: Secondary | ICD-10-CM

## 2023-11-06 DIAGNOSIS — E785 Hyperlipidemia, unspecified: Secondary | ICD-10-CM

## 2023-11-06 DIAGNOSIS — E042 Nontoxic multinodular goiter: Secondary | ICD-10-CM | POA: Diagnosis not present

## 2023-11-06 DIAGNOSIS — I2510 Atherosclerotic heart disease of native coronary artery without angina pectoris: Secondary | ICD-10-CM

## 2023-11-06 DIAGNOSIS — R5383 Other fatigue: Secondary | ICD-10-CM

## 2023-11-06 DIAGNOSIS — I10 Essential (primary) hypertension: Secondary | ICD-10-CM | POA: Diagnosis not present

## 2023-11-06 DIAGNOSIS — G4709 Other insomnia: Secondary | ICD-10-CM

## 2023-11-06 DIAGNOSIS — J069 Acute upper respiratory infection, unspecified: Secondary | ICD-10-CM

## 2023-11-06 DIAGNOSIS — M158 Other polyosteoarthritis: Secondary | ICD-10-CM

## 2023-11-06 NOTE — Progress Notes (Signed)
 Annual Wellness Visit     Patient: Brett Wilson, Male    DOB: [birth year], 68 y.o.   MRN: [id number] Visit Date: 11/06/2023  Today's Provider: Sala Tague, [name]   Chief Complaint  Patient presents with   Annual Wellness Visit    AWV pt has cold   Subjective    Brett Wilson is a 68 y.o. male who presents today for his Annual Wellness Visit.  Discussed the use of AI scribe software for clinical note transcription with the patient, who gave verbal consent to proceed.  History of Present Illness Brett Viernes "Josiah Nigh" is a 68 year old male who presents with a sore throat, runny nose, and cough.  He developed these symptoms two days ago after returning from a two-week trip to [place]. He wore a mask on the plane and has not had a cold in approximately ten years. There was no known exposure to individuals with viral infections during his trip.  He has stable thyroid  nodules and experiences occasional yeast infections on his skin, treated with a prescribed cream. He takes losartan , metoprolol , atorvastatin, aspirin , and finasteride . He uses ketoconazole  cream for seborrheic dermatitis. He monitors his blood pressure daily and has regular cardiology follow-ups.  He denies chest pain, shortness of breath, seizures, nausea, vomiting, or changes in bowel habits. He sleeps well without trazodone  and maintains regular bowel movements. He consumes yogurt and acidophilus for gut health.   Medications: Outpatient Medications Prior to Visit  Medication Sig   aspirin  EC 81 MG tablet Take 81 mg by mouth daily. Swallow whole.   celecoxib  (CELEBREX ) 100 MG capsule TAKE 1 CAPSULE BY MOUTH 2 TIMES DAILY AS NEEDED.   finasteride  (PROSCAR ) 5 MG tablet 1 tablet every other day   ketoconazole  (NIZORAL ) 2 % cream Apply to face QD.   losartan  (COZAAR ) 100 MG tablet TAKE 1 TABLET BY MOUTH EVERY DAY   metoprolol  succinate (TOPROL -XL) 25 MG 24 hr tablet Take 25 mg by mouth every morning. Taking 2 tabs daily    Multiple Vitamin (MULTIVITAMIN WITH MINERALS) TABS tablet Take 1 tablet by mouth daily.   rosuvastatin  (CRESTOR ) 40 MG tablet Take 1 tablet (40 mg total) by mouth daily. (Patient taking differently: Take 40 mg by mouth at bedtime.)   traZODone  (DESYREL ) 50 MG tablet START TAKING 1/2 TABLET AT BEDTIME AND INCREASE TO 1 TABLET IF NEEDED   Zinc 50 MG TABS Take 50 mg by mouth daily.   zolpidem  (AMBIEN ) 5 MG tablet Take 1 tablet (5 mg total) by mouth at bedtime as needed for sleep.   No facility-administered medications prior to visit.    Allergies  Allergen Reactions   Penicillins Hives    Patient Care Team: Cozette Braggs, [name] as PCP - General (Physician Assistant)  Review of Systems  All other systems reviewed and are negative.        Objective    Vitals: BP 134/76 (BP Location: Right Arm, Patient Position: Sitting, Cuff Size: Normal)   Pulse 85   Resp 16   Ht 5\' 7"  (1.702 m)   Wt 170 lb (77.1 kg)   SpO2 98%   BMI 26.63 kg/m      Physical Exam Vitals reviewed.  Constitutional:      General: He is not in acute distress.    Appearance: Normal appearance. He is well-developed. He is not ill-appearing, toxic-appearing or diaphoretic.  HENT:     Head: Normocephalic and atraumatic.     Right Ear: Tympanic membrane, ear  canal and external ear normal.     Left Ear: Tympanic membrane, ear canal and external ear normal.     Nose: Nose normal. No congestion or rhinorrhea.     Mouth/Throat:     Mouth: Mucous membranes are moist.     Pharynx: Oropharynx is clear. No oropharyngeal exudate.  Eyes:     General: No scleral icterus.       Right eye: No discharge.        Left eye: No discharge.     Conjunctiva/sclera: Conjunctivae normal.     Pupils: Pupils are equal, round, and reactive to light.  Neck:     Thyroid : No thyromegaly.     Vascular: No carotid bruit.  Cardiovascular:     Rate and Rhythm: Normal rate and regular rhythm.     Pulses: Normal pulses.     Heart  sounds: Normal heart sounds. No murmur heard.    No friction rub. No gallop.  Pulmonary:     Effort: Pulmonary effort is normal. No respiratory distress.     Breath sounds: Normal breath sounds. No wheezing, rhonchi or rales.  Abdominal:     General: Abdomen is flat. Bowel sounds are normal. There is no distension.     Palpations: Abdomen is soft. There is no mass.     Tenderness: There is no abdominal tenderness. There is no right CVA tenderness, left CVA tenderness, guarding or rebound.     Hernia: No hernia is present.  Musculoskeletal:        General: No swelling, tenderness, deformity or signs of injury. Normal range of motion.     Cervical back: Normal range of motion and neck supple. No rigidity or tenderness.     Right lower leg: No edema.     Left lower leg: No edema.  Lymphadenopathy:     Cervical: No cervical adenopathy.  Skin:    General: Skin is warm and dry.     Coloration: Skin is not jaundiced or pale.     Findings: No bruising, erythema, lesion or rash.  Neurological:     Mental Status: He is alert and oriented to person, place, and time. Mental status is at baseline.     Gait: Gait normal.  Psychiatric:        Mood and Affect: Mood normal.        Behavior: Behavior normal.        Thought Content: Thought content normal.        Judgment: Judgment normal.     Most recent functional status assessment:    11/06/2023    1:19 PM  In your present state of health, do you have any difficulty performing the following activities:  Hearing? 0  Vision? 0  Difficulty concentrating or making decisions? 0  Walking or climbing stairs? 0  Dressing or bathing? 0  Doing errands, shopping? 0   Most recent fall risk assessment:    11/06/2023    1:19 PM  Fall Risk   Falls in the past year? 0  Number falls in past yr: 0  Injury with Fall? 0    Most recent depression screenings:    11/06/2023    1:31 PM 09/19/2023    9:57 AM  PHQ 2/9 Scores  PHQ - 2 Score 0 0  PHQ- 9  Score 1 2   Most recent cognitive screening:    11/06/2023    1:21 PM  6CIT Screen  What Year? 0 points  What month? 0 points  What time? 0 points  Count back from 20 0 points  Months in reverse 0 points  Repeat phrase 0 points  Total Score 0 points   Most recent Audit-C alcohol use screening    09/18/2023    2:07 PM  Alcohol Use Disorder Test (AUDIT)  1. How often do you have a drink containing alcohol? 1  2. How many drinks containing alcohol do you have on a typical day when you are drinking? 0  3. How often do you have six or more drinks on one occasion? 0  AUDIT-C Score 1      Patient-reported   A score of 3 or more in women, and 4 or more in men indicates increased risk for alcohol abuse, EXCEPT if all of the points are from question 1   No results found for any visits on 11/06/23.  Assessment & Plan     Annual wellness visit done today including the all of the following: Reviewed patient's Family Medical History Reviewed and updated list of patient's medical providers Assessment of cognitive impairment was done Assessed patient's functional ability Established a written schedule for health screening services Health Risk Assessent Completed and Reviewed  Exercise Activities and Dietary recommendations  Goals   None     Immunization History  Administered Date(s) Administered   Fluad Quad(high Dose 65+) 03/22/2021   Influenza,inj,Quad PF,6+ Mos 02/24/2019   Influenza-Unspecified 03/31/2020   PFIZER Comirnaty(Gray Top)Covid-19 Tri-Sucrose Vaccine 09/06/2019, 09/27/2019   PNEUMOCOCCAL CONJUGATE-20 09/05/2021   Pfizer Covid-19 Vaccine Bivalent Booster 46yrs & up 11/22/2020, 04/13/2021   Tdap 10/19/2019   Zoster Recombinant(Shingrix ) 06/02/2019, 10/19/2019    Health Maintenance  Topic Date Due   COVID-19 Vaccine (5 - 2024-25 season) 02/16/2023   INFLUENZA VACCINE  01/16/2024   Colonoscopy  04/12/2024   Medicare Annual Wellness (AWV)  11/05/2024    DTaP/Tdap/Td (2 - Td or Tdap) 10/18/2029   Pneumonia Vaccine 63+ Years old  Completed   Hepatitis C Screening  Completed   Zoster Vaccines- Shingrix   Completed   HPV VACCINES  Aged Out   Meningococcal B Vaccine  Aged Out     Discussed health benefits of physical activity, and encouraged him to engage in regular exercise appropriate for his age and condition.    Assessment & Plan Viral upper respiratory infection Acute viral infection with sore throat, rhinorrhea, and cough. COVID-19 and influenza tests negative. Self-limiting. - Advise warm saltwater gargles. - Encourage increased fluid intake, air humidier OTC antihistamines, over-the-counter pain medications Nasal saline rinse/spray/ Flonase If symptoms persist, return to clinic  Hypertension Chronic  hypertension managed with losartan  100 mg and metoprolol  25mg  . Blood pressure slightly above target. Emphasized maintaining below 130/80 mmHg. - Continue losartan  and metoprolol . - Advise daily blood pressure monitoring. - Reinforce target blood pressure goals. Will follow-up  Coronary artery disease Coronary artery disease managed with aspirin  and statin. Recent EKG normal, no new symptoms. - Continue aspirin  and statin therapy. - Maintain regular follow-up with cardiologist.  Pre-diabetes Chronic  pre-diabetes with A1c of 6.1, indicating improvement. Lifestyle modifications crucial. - Encourage dietary modifications and regular exercise. - Monitor A1c levels regularly. Will follow-up  Thyroid  nodules Thyroid  nodules with no significant changes on recent ultrasound. Continued monitoring necessary. - Advise reporting any changes in nodules.  Facial rash Intermittent seborrheic dermatitis with biannual flare-ups. - Use ketoconazole  cream as needed for flare-ups.    Return in about 1 year (around 11/05/2024) for AW.    The patient was advised to call  back or seek an in-person evaluation if the symptoms worsen or if the  condition fails to improve as anticipated.  I discussed the assessment and treatment plan with the patient. The patient was provided an opportunity to ask questions and all were answered. The patient agreed with the plan and demonstrated an understanding of the instructions.  I, Kairon Shock, [name] have reviewed all documentation for this visit. The documentation on 11/06/2023  for the exam, diagnosis, procedures, and orders are all accurate and complete.   Blane Bunting, [name]  [facility] [phone] (phone) [phone] (fax)  [facility]

## 2023-11-13 ENCOUNTER — Other Ambulatory Visit: Payer: Self-pay | Admitting: Cardiology

## 2023-11-13 DIAGNOSIS — I2510 Atherosclerotic heart disease of native coronary artery without angina pectoris: Secondary | ICD-10-CM

## 2023-11-13 DIAGNOSIS — R079 Chest pain, unspecified: Secondary | ICD-10-CM

## 2023-11-15 ENCOUNTER — Other Ambulatory Visit: Payer: Self-pay | Admitting: Physician Assistant

## 2023-11-15 DIAGNOSIS — I10 Essential (primary) hypertension: Secondary | ICD-10-CM

## 2023-11-16 ENCOUNTER — Other Ambulatory Visit: Payer: Self-pay | Admitting: Physician Assistant

## 2023-11-16 DIAGNOSIS — G4701 Insomnia due to medical condition: Secondary | ICD-10-CM

## 2023-11-17 ENCOUNTER — Other Ambulatory Visit: Payer: Self-pay

## 2023-11-17 DIAGNOSIS — G4701 Insomnia due to medical condition: Secondary | ICD-10-CM

## 2023-12-04 ENCOUNTER — Ambulatory Visit
Admission: RE | Admit: 2023-12-04 | Discharge: 2023-12-04 | Disposition: A | Discharge status: Home / Self Care | Source: Ambulatory Visit | Attending: Cardiology | Admitting: Cardiology

## 2023-12-04 DIAGNOSIS — R079 Chest pain, unspecified: Secondary | ICD-10-CM | POA: Diagnosis present

## 2023-12-04 DIAGNOSIS — I2510 Atherosclerotic heart disease of native coronary artery without angina pectoris: Secondary | ICD-10-CM | POA: Insufficient documentation

## 2023-12-04 MED ORDER — TECHNETIUM TC 99M TETROFOSMIN IV KIT
10.1100 | PACK | Freq: Once | INTRAVENOUS | Status: AC | PRN
  Administered 2023-12-04: 10.11 via INTRAVENOUS

## 2023-12-04 MED ORDER — TECHNETIUM TC 99M TETROFOSMIN IV KIT: 30.7300 | PACK | Freq: Once | INTRAVENOUS | Status: AC | PRN

## 2023-12-15 LAB — NM MYOCAR MULTI W/SPECT W/WALL MOTION / EF
Angina Index: 1
Base ST Depression (mm): 0 mm
Duke Treadmill Score: 3
Estimated workload: 7
Exercise duration (min): 6 min
Exercise duration (sec): 30 s
LV dias vol: 64 mL (ref 62–150)
LV sys vol: 17 mL (ref 4.2–5.8)
MPHR: 152 {beats}/min
Nuc Stress EF: 73 %
Peak HR: 130 {beats}/min
Percent HR: 85 %
Rest HR: 83 {beats}/min
Rest Nuclear Isotope Dose: 10.1 mCi
SDS: 0
SRS: 2
SSS: 0
ST Depression (mm): 0 mm
Stress Nuclear Isotope Dose: 30.7 mCi
TID: 0.81

## 2024-01-09 ENCOUNTER — Other Ambulatory Visit: Payer: Self-pay

## 2024-01-13 ENCOUNTER — Ambulatory Visit: Payer: Self-pay | Admitting: Physician Assistant

## 2024-02-15 ENCOUNTER — Other Ambulatory Visit: Payer: Self-pay | Admitting: Physician Assistant

## 2024-02-15 DIAGNOSIS — I10 Essential (primary) hypertension: Secondary | ICD-10-CM

## 2024-02-16 ENCOUNTER — Other Ambulatory Visit: Payer: Self-pay | Admitting: Family Medicine

## 2024-02-16 DIAGNOSIS — G4701 Insomnia due to medical condition: Secondary | ICD-10-CM

## 2024-02-26 ENCOUNTER — Ambulatory Visit: Payer: Medicare HMO | Admitting: Dermatology

## 2024-02-26 ENCOUNTER — Encounter: Payer: Self-pay | Admitting: Dermatology

## 2024-02-26 DIAGNOSIS — L820 Inflamed seborrheic keratosis: Secondary | ICD-10-CM | POA: Diagnosis not present

## 2024-02-26 DIAGNOSIS — W908XXA Exposure to other nonionizing radiation, initial encounter: Secondary | ICD-10-CM

## 2024-02-26 DIAGNOSIS — D485 Neoplasm of uncertain behavior of skin: Secondary | ICD-10-CM | POA: Diagnosis not present

## 2024-02-26 DIAGNOSIS — L578 Other skin changes due to chronic exposure to nonionizing radiation: Secondary | ICD-10-CM | POA: Diagnosis not present

## 2024-02-26 DIAGNOSIS — D229 Melanocytic nevi, unspecified: Secondary | ICD-10-CM

## 2024-02-26 DIAGNOSIS — D2261 Melanocytic nevi of right upper limb, including shoulder: Secondary | ICD-10-CM | POA: Diagnosis not present

## 2024-02-26 DIAGNOSIS — L814 Other melanin hyperpigmentation: Secondary | ICD-10-CM

## 2024-02-26 DIAGNOSIS — Z1283 Encounter for screening for malignant neoplasm of skin: Secondary | ICD-10-CM | POA: Diagnosis not present

## 2024-02-26 DIAGNOSIS — D492 Neoplasm of unspecified behavior of bone, soft tissue, and skin: Secondary | ICD-10-CM

## 2024-02-26 DIAGNOSIS — L821 Other seborrheic keratosis: Secondary | ICD-10-CM

## 2024-02-26 DIAGNOSIS — Z86018 Personal history of other benign neoplasm: Secondary | ICD-10-CM

## 2024-02-26 NOTE — Patient Instructions (Addendum)
 Cryotherapy Aftercare  Wash gently with soap and water everyday.   Apply Vaseline Jelly daily until healed.    Wound Care Instructions  Cleanse wound gently with soap and water once a day then pat dry with clean gauze. Apply a thin coat of Petrolatum (petroleum jelly, Vaseline) over the wound (unless you have an allergy to this). We recommend that you use a new, sterile tube of Vaseline. Do not pick or remove scabs. Do not remove the yellow or white healing tissue from the base of the wound.  Cover the wound with fresh, clean, nonstick gauze and secure with paper tape. You may use Band-Aids in place of gauze and tape if the wound is small enough, but would recommend trimming much of the tape off as there is often too much. Sometimes Band-Aids can irritate the skin.  You should call the office for your biopsy report after 1 week if you have not already been contacted.  If you experience any problems, such as abnormal amounts of bleeding, swelling, significant bruising, significant pain, or evidence of infection, please call the office immediately.  FOR ADULT SURGERY PATIENTS: If you need something for pain relief you may take 1 extra strength Tylenol  (acetaminophen ) AND 2 Ibuprofen (200mg  each) together every 4 hours as needed for pain. (do not take these if you are allergic to them or if you have a reason you should not take them.) Typically, you may only need pain medication for 1 to 3 days.      Recommend daily broad spectrum sunscreen SPF 30+ to sun-exposed areas, reapply every 2 hours as needed. Call for new or changing lesions.  Staying in the shade or wearing long sleeves, sun glasses (UVA+UVB protection) and wide brim hats (4-inch brim around the entire circumference of the hat) are also recommended for sun protection.      Melanoma ABCDEs  Melanoma is the most dangerous type of skin cancer, and is the leading cause of death from skin disease.  You are more likely to develop  melanoma if you: Have light-colored skin, light-colored eyes, or red or blond hair Spend a lot of time in the sun Tan regularly, either outdoors or in a tanning bed Have had blistering sunburns, especially during childhood Have a close family member who has had a melanoma Have atypical moles or large birthmarks  Early detection of melanoma is key since treatment is typically straightforward and cure rates are extremely high if we catch it early.   The first sign of melanoma is often a change in a mole or a new dark spot.  The ABCDE system is a way of remembering the signs of melanoma.  A for asymmetry:  The two halves do not match. B for border:  The edges of the growth are irregular. C for color:  A mixture of colors are present instead of an even brown color. D for diameter:  Melanomas are usually (but not always) greater than 6mm - the size of a pencil eraser. E for evolution:  The spot keeps changing in size, shape, and color.  Please check your skin once per month between visits. You can use a small mirror in front and a large mirror behind you to keep an eye on the back side or your body.   If you see any new or changing lesions before your next follow-up, please call to schedule a visit.  Please continue daily skin protection including broad spectrum sunscreen SPF 30+ to sun-exposed areas, reapplying every  2 hours as needed when you're outdoors.   Staying in the shade or wearing long sleeves, sun glasses (UVA+UVB protection) and wide brim hats (4-inch brim around the entire circumference of the hat) are also recommended for sun protection.      Due to recent changes in healthcare laws, you may see results of your pathology and/or laboratory studies on MyChart before the doctors have had a chance to review them. We understand that in some cases there may be results that are confusing or concerning to you. Please understand that not all results are received at the same time and often  the doctors may need to interpret multiple results in order to provide you with the best plan of care or course of treatment. Therefore, we ask that you please give us  2 business days to thoroughly review all your results before contacting the office for clarification. Should we see a critical lab result, you will be contacted sooner.   If You Need Anything After Your Visit  If you have any questions or concerns for your doctor, please call our main line at [phone] and press option 4 to reach your doctor's medical assistant. If no one answers, please leave a voicemail as directed and we will return your call as soon as possible. Messages left after 4 pm will be answered the following business day.   You may also send us  a message via MyChart. We typically respond to MyChart messages within 1-2 business days.  For prescription refills, please ask your pharmacy to contact our office. Our fax number is [phone].  If you have an urgent issue when the clinic is closed that cannot wait until the next business day, you can page your doctor at the number below.    Please note that while we do our best to be available for urgent issues outside of office hours, we are not available 24/7.   If you have an urgent issue and are unable to reach us , you may choose to seek medical care at your doctor's office, retail clinic, urgent care center, or emergency room.  If you have a medical emergency, please immediately call 911 or go to the emergency department.  Pager Numbers  - Dr. Hester: [phone]  - Dr. Jackquline: [phone]  - Dr. Claudene: [phone]   - Dr. Raymund: [phone]  In the event of inclement weather, please call our main line at [phone] for an update on the status of any delays or closures.  Dermatology Medication Tips: Please keep the boxes that topical medications come in in order to help keep track of the instructions about where and how to use these. Pharmacies  typically print the medication instructions only on the boxes and not directly on the medication tubes.   If your medication is too expensive, please contact our office at [phone] option 4 or send us  a message through MyChart.   We are unable to tell what your co-pay for medications will be in advance as this is different depending on your insurance coverage. However, we may be able to find a substitute medication at lower cost or fill out paperwork to get insurance to cover a needed medication.   If a prior authorization is required to get your medication covered by your insurance company, please allow us  1-2 business days to complete this process.  Drug prices often vary depending on where the prescription is filled and some pharmacies may offer cheaper prices.  The website www.goodrx.com contains coupons for medications through  different pharmacies. The prices here do not account for what the cost may be with help from insurance (it may be cheaper with your insurance), but the website can give you the price if you did not use any insurance.  - You can print the associated coupon and take it with your prescription to the pharmacy.  - You may also stop by our office during regular business hours and pick up a GoodRx coupon card.  - If you need your prescription sent electronically to a different pharmacy, notify our office through [facility] or by phone at [phone] option 4.     Si Usted Necesita Algo Despus de Su Visita  Tambin puede enviarnos un mensaje a travs de Clinical cytogeneticist. Por lo general respondemos a los mensajes de MyChart en el transcurso de 1 a 2 das hbiles.  Para renovar recetas, por favor pida a su farmacia que se ponga en contacto con nuestra oficina. Randi lakes de fax es [place] [phone].  Si tiene un asunto urgente cuando la clnica est cerrada y que no puede esperar hasta el siguiente da hbil, puede llamar/localizar a su doctor(a) al nmero que  aparece a continuacin.   Por favor, tenga en cuenta que aunque hacemos todo lo posible para estar disponibles para asuntos urgentes fuera del horario de [place], no estamos disponibles las 24 horas del da, los 7 [address]  [address] de la [place].   Si tiene un problema urgente y no puede comunicarse con nosotros, puede optar por buscar atencin mdica  en el consultorio de su doctor(a), en una clnica privada, en un centro de atencin urgente o en una sala de emergencias.  Si tiene Engineer, drilling, por favor llame inmediatamente al 911 o vaya a la sala de emergencias.  Nmeros de bper  - Dr. Hester: [phone]  - Dra. Jackquline: [phone]  - Dr. Claudene: [phone]  - Dra. Kitts: [phone]  En caso de inclemencias del [place], por favor llame a nuestra lnea principal al [phone] para una actualizacin sobre el estado de cualquier retraso o cierre.  Consejos para la medicacin en dermatologa: Por favor, guarde las cajas en las que vienen los medicamentos de uso tpico para ayudarle a seguir las instrucciones sobre dnde y cmo usarlos. Las farmacias generalmente imprimen las instrucciones del medicamento slo en las cajas y no directamente en los tubos del [place].   Si su medicamento es muy caro, por favor, pngase en contacto con landry rieger llamando al [phone] y presione la opcin 4 o envenos un mensaje a travs de Clinical cytogeneticist.   No podemos decirle cul ser su copago por los medicamentos por adelantado ya que esto es diferente dependiendo de la cobertura de su seguro. Sin embargo, es posible que podamos encontrar un medicamento sustituto a Audiological scientist un formulario para que el seguro cubra el medicamento que se considera necesario.   Si se requiere una autorizacin previa para que su compaa de seguros malta su medicamento, por favor permtanos de 1 a 2 das hbiles para completar este proceso.  Los precios de los medicamentos varan con frecuencia  dependiendo del Environmental consultant de dnde se surte la receta y alguna farmacias pueden ofrecer precios ms baratos.  El sitio web www.goodrx.com tiene cupones para medicamentos de Health and safety inspector. Los precios aqu no tienen en cuenta lo que podra costar con la ayuda del seguro (puede ser ms barato con su seguro), pero el sitio web puede darle el precio si no utiliz Tourist information centre manager.  - Puede imprimir el  cupn correspondiente y llevarlo con su receta a la farmacia.  - Tambin puede pasar por nuestra oficina durante el horario de atencin regular y Education officer, museum una tarjeta de cupones de GoodRx.  - Si necesita que su receta se enve electrnicamente a una farmacia diferente, informe a nuestra oficina a travs de MyChart de [place] o por telfono llamando al [phone] y presione la opcin 4.

## 2024-02-26 NOTE — Progress Notes (Signed)
 Follow-Up Visit   Subjective  Brett Wilson is a 68 y.o. male who presents for the following: Skin Cancer Screening and Full Body Skin Exam. Hx of dysplastic nevi.   The patient presents for Total-Body Skin Exam (TBSE) for skin cancer screening and mole check. The patient has spots, moles and lesions to be evaluated, some may be new or changing and the patient may have concern these could be cancer.  The following portions of the chart were reviewed this encounter and updated as appropriate: medications, allergies, medical history  Review of Systems:  No other skin or systemic complaints except as noted in HPI or Assessment and Plan.  Objective  Well appearing patient in no apparent distress; mood and affect are within normal limits.  A full examination was performed including scalp, head, eyes, ears, nose, lips, neck, chest, axillae, abdomen, back, buttocks, bilateral upper extremities, bilateral lower extremities, hands, feet, fingers, toes, fingernails, and toenails. All findings within normal limits unless otherwise noted below.   Relevant physical exam findings are noted in the Assessment and Plan.  Right Flank x1, L forehead x1 (2) Erythematous keratotic or waxy stuck-on papule or plaque. Right Shoulder - Anterior 0.7 x 0.6 irregular brown macule   Assessment & Plan   SKIN CANCER SCREENING PERFORMED TODAY.  HISTORY OF DYSPLASTIC NEVI No evidence of recurrence today Recommend regular full body skin exams Recommend daily broad spectrum sunscreen SPF 30+ to sun-exposed areas, reapply every 2 hours as needed.  Call if any new or changing lesions are noted between office visits  ACTINIC DAMAGE - Chronic condition, secondary to cumulative UV/sun exposure - diffuse scaly erythematous macules with underlying dyspigmentation - Recommend daily broad spectrum sunscreen SPF 30+ to sun-exposed areas, reapply every 2 hours as needed.  - Staying in the shade or wearing long sleeves, sun  glasses (UVA+UVB protection) and wide brim hats (4-inch brim around the entire circumference of the hat) are also recommended for sun protection.  - Call for new or changing lesions.  LENTIGINES, SEBORRHEIC KERATOSES, HEMANGIOMAS - Benign normal skin lesions - Benign-appearing - Call for any changes  MELANOCYTIC NEVI - Tan-brown and/or pink-flesh-colored symmetric macules and papules - Benign appearing on exam today - Observation - Call clinic for new or changing moles - Recommend daily use of broad spectrum spf 30+ sunscreen to sun-exposed areas.   INFLAMED SEBORRHEIC KERATOSIS (2) Right Flank x1, L forehead x1 (2) Symptomatic, irritating, patient would like treated. Destruction of lesion - Right Flank x1, L forehead x1 (2) Complexity: simple   Destruction method: cryotherapy   Informed consent: discussed and consent obtained   Timeout:  patient name, date of birth, surgical site, and procedure verified Lesion destroyed using liquid nitrogen: Yes   Region frozen until ice ball extended beyond lesion: Yes   Outcome: patient tolerated procedure well with no complications   Post-procedure details: wound care instructions given   Additional details:  Prior to procedure, discussed risks of blister formation, small wound, skin dyspigmentation, or rare scar following cryotherapy. Recommend Vaseline ointment to treated areas while healing.   NEOPLASM OF SKIN Right Shoulder - Anterior Epidermal / dermal shaving  Lesion diameter (cm):  0.7 Informed consent: discussed and consent obtained   Timeout: patient name, date of birth, surgical site, and procedure verified   Procedure prep:  Patient was prepped and draped in usual sterile fashion Prep type:  Isopropyl alcohol Anesthesia: the lesion was anesthetized in a standard fashion   Anesthetic:  1% lidocaine  w/ epinephrine   1-100,000 buffered w/ 8.4% NaHCO3 Instrument used: flexible razor blade   Hemostasis achieved with: pressure,  aluminum chloride and electrodesiccation   Outcome: patient tolerated procedure well   Post-procedure details: sterile dressing applied and wound care instructions given   Dressing type: bandage and petrolatum    Specimen 1 - Surgical pathology Differential Diagnosis: nevus, R/O dysplastic nevus  Check Margins: Yes Return in about 1 year (around 02/25/2025) for TBSE, HxDN.  I, Jill Parcell, CMA, am acting as scribe for Alm Rhyme, MD.   Documentation: I have reviewed the above documentation for accuracy and completeness, and I agree with the above.  Alm Rhyme, MD

## 2024-03-01 LAB — SURGICAL PATHOLOGY

## 2024-03-02 ENCOUNTER — Ambulatory Visit: Payer: Self-pay | Admitting: Dermatology

## 2024-03-02 ENCOUNTER — Encounter: Payer: Self-pay | Admitting: Dermatology

## 2024-03-02 NOTE — Telephone Encounter (Signed)
 Pt reviewed pathology results via MyChart and had no further questions or concerns.

## 2024-03-02 NOTE — Telephone Encounter (Signed)
-----   Message from Alm Rhyme sent at 03/02/2024 11:17 AM EDT ----- FINAL DIAGNOSIS        1. Skin, right shoulder - anterior :       DYSPLASTIC JUNCTIONAL NEVUS WITH MODERATE ATYPIA, WITH SCAR, CLOSE TO MARGIN,       SEE DESCRIPTION   Moderate Dysplastic Recheck next visit ----- Message ----- From: Interface, Lab In Three Zero One Sent: 03/01/2024   6:11 PM EDT To: Alm JAYSON Rhyme, MD

## 2024-03-19 ENCOUNTER — Ambulatory Visit: Admitting: Physician Assistant

## 2024-03-29 ENCOUNTER — Ambulatory Visit (INDEPENDENT_AMBULATORY_CARE_PROVIDER_SITE_OTHER): Admitting: Physician Assistant

## 2024-03-29 ENCOUNTER — Encounter: Payer: Self-pay | Admitting: Physician Assistant

## 2024-03-29 VITALS — BP 151/81 | HR 71 | Resp 14 | Ht 67.0 in | Wt 172.9 lb

## 2024-03-29 DIAGNOSIS — I10 Essential (primary) hypertension: Secondary | ICD-10-CM

## 2024-03-29 DIAGNOSIS — R7303 Prediabetes: Secondary | ICD-10-CM

## 2024-03-29 DIAGNOSIS — N138 Other obstructive and reflux uropathy: Secondary | ICD-10-CM | POA: Diagnosis not present

## 2024-03-29 DIAGNOSIS — N401 Enlarged prostate with lower urinary tract symptoms: Secondary | ICD-10-CM

## 2024-03-29 DIAGNOSIS — G4733 Obstructive sleep apnea (adult) (pediatric): Secondary | ICD-10-CM

## 2024-03-29 DIAGNOSIS — M542 Cervicalgia: Secondary | ICD-10-CM

## 2024-03-29 DIAGNOSIS — Z23 Encounter for immunization: Secondary | ICD-10-CM

## 2024-03-29 DIAGNOSIS — E042 Nontoxic multinodular goiter: Secondary | ICD-10-CM

## 2024-03-29 DIAGNOSIS — I2510 Atherosclerotic heart disease of native coronary artery without angina pectoris: Secondary | ICD-10-CM

## 2024-03-29 MED ORDER — FINASTERIDE 5 MG PO TABS
ORAL_TABLET | ORAL | 3 refills | Status: AC
Start: 2024-03-29 — End: ?

## 2024-03-29 MED ORDER — CELECOXIB 100 MG PO CAPS
100.0000 mg | ORAL_CAPSULE | Freq: Two times a day (BID) | ORAL | 0 refills | Status: AC
Start: 2024-03-29 — End: ?

## 2024-03-29 NOTE — Progress Notes (Signed)
 Established patient visit  Patient: Brett Wilson   DOB: 06-Aug-1955   68 y.o. Male  MRN: [id number] Visit Date: 03/29/2024  Today's healthcare provider: Jolynn Spencer, [name]   Chief Complaint  Patient presents with   Medical Management of Chronic Issues    Pt reports no concerns. Doing good. Requesting refills and A1c blood work   Subjective     HPI     Medical Management of Chronic Issues    Additional comments: Pt reports no concerns. Doing good. Requesting refills and A1c blood work      Last edited by Wilfred Hargis RAMAN, CMA on 03/29/2024  8:16 AM.       Discussed the use of AI scribe software for clinical note transcription with the patient, who gave verbal consent to proceed.  History of Present Illness Brett Wilson is a 68 year old male with hypertension, coronary artery disease, and sleep apnea who presents for a follow-up visit.  He experienced neck pain last night, likely due to arthritis and degenerative joint disease, which has since subsided. He uses Tylenol , Celebrex , a heating pad, and ice for relief.  He had elevated blood pressure for about a week following shoulder surgery, reaching 160/80 mmHg in the mornings, but it normalized later in the day. His usual blood pressure is 125/70 mmHg. He is on Losartan  100 mg and Metoprolol  25 mg, along with aspirin  and cholesterol medication for coronary artery disease.  He has prediabetes, with an A1c check pending. There are no changes in steroid use or any sensation of a lump or swelling in his throat.  He is experiencing issues with his CPAP machine due to mask leaks and high AHI numbers, ranging from 20-30. He has been using the CPAP for three months and plans to return to the sleep center for further evaluation.  He denies chest pain, shortness of breath, rapid heart beating, or visual changes. He is on finasteride  for prostate health with no changes in urinary symptoms such as frequency, hesitancy, or  retention.       03/29/2024    8:15 AM 11/06/2023    1:31 PM 09/19/2023    9:57 AM  Depression screen PHQ 2/9  Decreased Interest 0 0 0  Down, Depressed, Hopeless 0 0 0  PHQ - 2 Score 0 0 0  Altered sleeping  0 1  Tired, decreased energy  1 1  Change in appetite  0 0  Feeling bad or failure about yourself   0 0  Trouble concentrating  0 0  Moving slowly or fidgety/restless  0 0  Suicidal thoughts  0 0  PHQ-9 Score  1 2  Difficult doing work/chores  Not difficult at all Somewhat difficult      11/06/2023    1:31 PM 09/19/2023    9:58 AM 02/24/2023    3:30 PM  GAD 7 : Generalized Anxiety Score  Nervous, Anxious, on Edge 0 0 0  Control/stop worrying 0 0 0  Worry too much - different things 0 0 0  Trouble relaxing 0 0 0  Restless 0 0 0  Easily annoyed or irritable 0 0 0  Afraid - awful might happen 0 0 0  Total GAD 7 Score 0 0 0  Anxiety Difficulty Not difficult at all Not difficult at all Not difficult at all    Medications: Outpatient Medications Prior to Visit  Medication Sig   aspirin  EC 81 MG tablet Take 81 mg by mouth daily. Swallow whole.  ketoconazole  (NIZORAL ) 2 % cream Apply to face QD.   losartan  (COZAAR ) 100 MG tablet TAKE 1 TABLET BY MOUTH EVERY DAY   metoprolol  succinate (TOPROL -XL) 25 MG 24 hr tablet Take 25 mg by mouth every morning. Taking 2 tabs daily   Multiple Vitamin (MULTIVITAMIN WITH MINERALS) TABS tablet Take 1 tablet by mouth daily.   rosuvastatin  (CRESTOR ) 40 MG tablet Take 1 tablet (40 mg total) by mouth daily. (Patient taking differently: Take 40 mg by mouth at bedtime.)   traZODone  (DESYREL ) 50 MG tablet START TAKING 1/2 TABLET AT BEDTIME AND INCREASE TO 1 TABLET IF NEEDED   Zinc 50 MG TABS Take 50 mg by mouth daily.   [DISCONTINUED] celecoxib  (CELEBREX ) 100 MG capsule TAKE 1 CAPSULE BY MOUTH 2 TIMES DAILY AS NEEDED.   [DISCONTINUED] finasteride  (PROSCAR ) 5 MG tablet 1 tablet every other day   [DISCONTINUED] zolpidem  (AMBIEN ) 5 MG tablet Take 1  tablet (5 mg total) by mouth at bedtime as needed for sleep.   No facility-administered medications prior to visit.    Review of Systems All negative Except see HPI       Objective    BP (!) 151/81   Pulse 71   Resp 14   Ht 5' 7 (1.702 m)   Wt 172 lb 14.4 oz (78.4 kg)   SpO2 100%   BMI 27.08 kg/m     Physical Exam   No results found for any visits on 03/29/24.      Assessment & Plan Hypertension White coat syndrome with diagnosis of hypertension Chronic Blood pressure elevated today, likely due to lack of sleep and neck pain. Typically well-controlled. White coat syndrome noted. - Monitor blood pressure regularly. - Maintain a healthy diet and manage stress. - Contact office if blood pressure remains elevated. Will follow-up  Coronary artery disease Well-managed on aspirin  and cholesterol medication. Recent stress test negative. LDL slightly above target. - Order fasting lipid profile. - Continue aspirin  and cholesterol medication. Follow-up with cardiology  Neck pain Neck pain likely due to arthritis and DJD. Managed with Tylenol , Celebrex , and heat/ice application. Continue current regimen Will follow-up  Benign prostatic hyperplasia with lower urinary tract symptoms Symptoms well-managed on finasteride . Continue current med Will follow-up  Prediabetes Chronic Last A1c  Plan to monitor glucose control. - Order A1c test. Continue lifestyle modifications Will follow-up  Obstructive sleep apnea Difficulty with CPAP due to mask leaks and high AHI. Scheduled for follow-up evaluation. - Attend [facility] appointment at [facility].  General Health Maintenance Received flu vaccine. Plans to receive additional vaccine at [facility]. - Obtain additional vaccine at [facility] and provide documentation. BPH with urinary obstruction  - finasteride  (PROSCAR ) 5 MG tablet; 1 tablet every other day  Dispense: 90 tablet; Refill: 3  Neck pain  - celecoxib   (CELEBREX ) 100 MG capsule; Take 1 capsule (100 mg total) by mouth 2 (two) times daily.  Dispense: 180 capsule; Refill: 0  Essential hypertension (Primary)  - Lipid panel  Prediabetes  - Hemoglobin A1c - Comprehensive metabolic panel with GFR  Multiple thyroid  nodules Asymptomatic Will monitor and check tsh at the follow-up   Orders Placed This Encounter  Procedures   Flu vaccine HIGH DOSE PF(Fluzone Trivalent)   Hemoglobin A1c   Comprehensive metabolic panel with GFR   Lipid panel    Has the patient fasted?:   Yes    Return in about 6 months (around 09/27/2024) for chronic disease f/u, CPE.   The patient was advised to  call back or seek an in-person evaluation if the symptoms worsen or if the condition fails to improve as anticipated.  I discussed the assessment and treatment plan with the patient. The patient was provided an opportunity to ask questions and all were answered. The patient agreed with the plan and demonstrated an understanding of the instructions.  I, Kellene Mccleary, [name] have reviewed all documentation for this visit. The documentation on 03/29/2024  for the exam, diagnosis, procedures, and orders are all accurate and complete.  Jolynn Spencer, [facility], MMS [facility] [phone] (phone) [phone] (fax)  [facility]

## 2024-03-31 LAB — COMPREHENSIVE METABOLIC PANEL WITH GFR
ALT: 35 IU/L (ref 0–44)
AST: 30 IU/L (ref 0–40)
Albumin: 4.3 g/dL (ref 3.9–4.9)
Alkaline Phosphatase: 103 IU/L (ref 47–123)
BUN/Creatinine Ratio: 19 (ref 10–24)
BUN: 18 mg/dL (ref 8–27)
Bilirubin Total: 0.9 mg/dL (ref 0.0–1.2)
CO2: 26 mmol/L (ref 20–29)
Calcium: 9.5 mg/dL (ref 8.6–10.2)
Chloride: 101 mmol/L (ref 96–106)
Creatinine, Ser: 0.97 mg/dL (ref 0.76–1.27)
Globulin, Total: 2.4 g/dL (ref 1.5–4.5)
Glucose: 86 mg/dL (ref 70–99)
Potassium: 4.5 mmol/L (ref 3.5–5.2)
Sodium: 142 mmol/L (ref 134–144)
Total Protein: 6.7 g/dL (ref 6.0–8.5)
eGFR: 85 mL/min/1.73 (ref >59)

## 2024-03-31 LAB — LIPID PANEL
Chol/HDL Ratio: 2.9 ratio (ref 0.0–5.0)
Cholesterol, Total: 140 mg/dL (ref 100–199)
HDL: 49 mg/dL (ref >39)
LDL Chol Calc (NIH): 77 mg/dL (ref 0–99)
Triglycerides: 69 mg/dL (ref 0–149)
VLDL Cholesterol Cal: 14 mg/dL (ref 5–40)

## 2024-03-31 LAB — HEMOGLOBIN A1C
Est. average glucose Bld gHb Est-mCnc: 126 mg/dL
Hgb A1c MFr Bld: 6 % — ABNORMAL HIGH (ref 4.8–5.6)

## 2024-04-01 ENCOUNTER — Ambulatory Visit: Payer: Self-pay | Admitting: Physician Assistant

## 2024-04-13 ENCOUNTER — Ambulatory Visit: Admitting: Physician Assistant

## 2024-04-30 ENCOUNTER — Ambulatory Visit: Admitting: Physician Assistant

## 2024-05-15 ENCOUNTER — Other Ambulatory Visit: Payer: Self-pay | Admitting: Physician Assistant

## 2024-05-15 DIAGNOSIS — G4701 Insomnia due to medical condition: Secondary | ICD-10-CM

## 2024-05-18 ENCOUNTER — Inpatient Hospital Stay: Admission: RE | Admit: 2024-05-18 | Discharge: 2024-05-18

## 2024-05-18 VITALS — BP 152/79 | HR 76 | Temp 98.7°F | Resp 20

## 2024-05-18 DIAGNOSIS — J0100 Acute maxillary sinusitis, unspecified: Secondary | ICD-10-CM | POA: Diagnosis not present

## 2024-05-18 MED ORDER — PREDNISONE 10 MG (21) PO TBPK: ORAL_TABLET | Freq: Every day | ORAL | 0 refills | Status: DC

## 2024-05-18 MED ORDER — AZITHROMYCIN 250 MG PO TABS: 250.0000 mg | ORAL_TABLET | Freq: Every day | ORAL | 0 refills | Status: DC

## 2024-05-18 MED ORDER — GUAIFENESIN-CODEINE 100-10 MG/5ML PO SOLN: 5.0000 mL | Freq: Four times a day (QID) | ORAL | 0 refills | Status: DC | PRN

## 2024-05-18 NOTE — Discharge Instructions (Signed)
 Today you are being treated for sinus infection  Take azithromycin as directed for treatment of bacteria  Begin prednisone  every morning with food to help reduce sinus pressure, avoid ibuprofen while taking but may use Tylenol   You may use cough syrup every 6 hours but please be mindful to make you feel drowsy    You can take Tylenol   as needed for fever reduction and pain relief.   For cough: honey 1/2 to 1 teaspoon (you can dilute the honey in water or another fluid).  You can also use guaifenesin and dextromethorphan for cough. You can use a humidifier for chest congestion and cough.  If you don't have a humidifier, you can sit in the bathroom with the hot shower running.      For sore throat: try warm salt water gargles, cepacol lozenges, throat spray, warm tea or water with lemon/honey, popsicles or ice, or OTC cold relief medicine for throat discomfort.   For congestion: take a daily anti-histamine like Zyrtec, Claritin, and a oral decongestant, such as pseudoephedrine.  You can also use Flonase 1-2 sprays in each nostril daily.   It is important to stay hydrated: drink plenty of fluids (water, gatorade/powerade/pedialyte, juices, or teas) to keep your throat moisturized and help further relieve irritation/discomfort.

## 2024-05-18 NOTE — ED Provider Notes (Signed)
 [facility]    CSN: [id number] Arrival date & time: 05/18/24  1019      History   Chief Complaint Chief Complaint  Patient presents with   Cough    6 days of cold symptoms with yellow/brown mucus from nose and chest - Entered by patient   Nasal Congestion    HPI Brett Wilson is a 68 y.o. male.   Patient presents for evaluation of a fever peaking at 99.6, nasal congestion, sinus pain and pressure to the bridge of the nose and cheeks, sore throat and a productive cough with discolored sputum present for 7 days.  Associated headache.  Tolerable to food and liquids but appetite is decreased.  Has attempted use of Robitussin day and night.  No known sick contact prior.  Denies respiratory history, non-smoker.   Past Medical History:  Diagnosis Date   Allergy    Arthritis 03/1999   BPH (benign prostatic hyperplasia)    Coronary artery disease 12/19/2021   a.) cCTA 12/19/2021: Ca score 220 (66th percentile for age/sex match control); >70% pLAD territory (FFR 0.69); b.) LHC/PCI 12/26/2021: 40% p-mRCA, 90% p-mLAD (2.75 x 26 mm Onxy Frontier DES x 1)   Diastolic dysfunction 09/10/2021   a.) stress TTE 09/10/2021: EF> 55%, triv-mild MR/TR/PR, G1DD.   Dysplastic nevus 01/17/2020   Left mid side. Severe atypia with scar, close to margin. exc 03/14/2020   Dysplastic nevus 01/17/2020   Left calf. Moderate atypia, deep margin involved.    Dysplastic nevus 01/17/2020   LUQ abdomen. Mild atypia, limited margins free.    Dysplastic nevus 02/26/2024   R ant shoulder - moderate   Hepatitis C antibody test positive    Hyperlipidemia    Hypertension    Liver nodule    Long term current use of antithrombotics/antiplatelets    a.) on DAPT (ASA + clopidogrel )   Lung nodule    Nontraumatic rotator cuff tear, right    Pituitary adenoma (HCC)    Pre-diabetes    Thyroid  nodule    Typical angina     Patient Active Problem List   Diagnosis Date Noted   Hepatitis C antibody test  positive 04/18/2022   Prediabetes 03/21/2022   Pseudophakia of both eyes 02/12/2022   PVD (posterior vitreous detachment), both eyes 02/12/2022   Macular pucker of both eyes 02/12/2022   Optic neuritis 02/12/2022   Liver nodule 02/07/2022   Thyroid  nodule incidentally noted on imaging study 02/07/2022   Other insomnia 02/07/2022   Coronary artery disease involving native coronary artery of native heart without angina pectoris 01/03/2022   Stable angina 12/26/2021   Family history of malignant neoplasm of gastrointestinal tract    Polyp of descending colon    Arthritis 02/25/2019   Hyperlipidemia 02/25/2019   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 02/25/2019    Past Surgical History:  Procedure Laterality Date   CATARACT EXTRACTION Bilateral    COLONOSCOPY WITH PROPOFOL  N/A 04/13/2019   Procedure: COLONOSCOPY WITH PROPOFOL ;  Surgeon: Jinny Carmine, MD;  Location: [facility] ENDOSCOPY;  Service: Endoscopy;  Laterality: N/A;   CORONARY STENT INTERVENTION N/A 12/26/2021   Procedure: CORONARY STENT INTERVENTION;  Surgeon: Lawyer Bernardino Cough, MD;  Location: [facility] INVASIVE CV LAB;  Service: Cardiovascular;  Laterality: N/A;   CORONARY ULTRASOUND/IVUS N/A 12/26/2021   Procedure: Intravascular Ultrasound/IVUS;  Surgeon: Lawyer Bernardino Cough, MD;  Location: [facility] INVASIVE CV LAB;  Service: Cardiovascular;  Laterality: N/A;   CYSTOSCOPY WITH INSERTION OF UROLIFT     CYSTOSCOPY WITH  INSERTION OF UROLIFT N/A 11/29/2019   Procedure: CYSTOSCOPY WITH INSERTION OF UROLIFT;  Surgeon: Penne Knee, MD;  Location: [facility] ORS;  Service: Urology;  Laterality: N/A;   EYE SURGERY  Cataract 2005   HOLEP-LASER ENUCLEATION OF THE PROSTATE WITH MORCELLATION N/A 11/29/2019   Procedure: HOLEP-LASER ENUCLEATION OF THE PROSTATE;  Surgeon: Penne Knee, MD;  Location: [facility] ORS;  Service: Urology;  Laterality: N/A;   KNEE ARTHROSCOPY  2008   LEFT HEART CATH AND CORONARY ANGIOGRAPHY N/A 12/26/2021    Procedure: LEFT HEART CATH AND CORONARY ANGIOGRAPHY;  Surgeon: Lawyer Bernardino Cough, MD;  Location: [facility] INVASIVE CV LAB;  Service: Cardiovascular;  Laterality: N/A;   PITUITARY EXCISION     SHOULDER ARTHROSCOPY Right 07/22/2022   Procedure: ARTHROSCOPY SHOULDER;  Surgeon: Rollene Cough, MD;  Location: [facility] ORS;  Service: Orthopedics;  Laterality: Right;  SUBACROMIAL DECOMPRESSION, OPEN BICEPS TENODESIS   TONSILLECTOMY         Home Medications    Prior to Admission medications   Medication Sig Start Date End Date Taking? Authorizing Provider  ezetimibe (ZETIA) 10 MG tablet Take 10 mg by mouth daily. 04/26/24  Yes [provider]  aspirin  EC 81 MG tablet Take 81 mg by mouth daily. Swallow whole.    [provider]  celecoxib  (CELEBREX ) 100 MG capsule Take 1 capsule (100 mg total) by mouth 2 (two) times daily. 03/29/24   Ostwalt, Janna, [name]  finasteride  (PROSCAR ) 5 MG tablet 1 tablet every other day 03/29/24   Ostwalt, Janna, [name]  ketoconazole  (NIZORAL ) 2 % cream Apply to face QD. 02/20/23   Hester Alm BROCKS, MD  losartan  (COZAAR ) 100 MG tablet TAKE 1 TABLET BY MOUTH EVERY DAY 02/17/24   Ostwalt, Janna, [name]  metoprolol  succinate (TOPROL -XL) 25 MG 24 hr tablet Take 25 mg by mouth every morning. Taking 2 tabs daily    [provider]  Multiple Vitamin (MULTIVITAMIN WITH MINERALS) TABS tablet Take 1 tablet by mouth daily.    [provider]  rosuvastatin  (CRESTOR ) 40 MG tablet Take 1 tablet (40 mg total) by mouth daily. Patient taking differently: Take 40 mg by mouth at bedtime. 12/26/21   Lawyer Bernardino Cough, MD  traZODone  (DESYREL ) 50 MG tablet START TAKING 1/2 TABLET AT BEDTIME AND INCREASE TO 1 TABLET IF NEEDED 05/18/24   Ostwalt, Janna, [name]  Zinc 50 MG TABS Take 50 mg by mouth daily.    [provider]    Family History Family History  Problem Relation Age of Onset   Hypertension Mother    Arthritis Mother    Colon cancer Father     Cancer Father     Social History Social History   Tobacco Use   Smoking status: Never   Smokeless tobacco: Never  Vaping Use   Vaping status: Never Used  Substance Use Topics   Alcohol use: Not Currently   Drug use: Never     Allergies   Penicillins   Review of Systems Review of Systems  Constitutional:  Positive for fever. Negative for activity change, appetite change, chills, diaphoresis, fatigue and unexpected weight change.  HENT:  Positive for congestion, sinus pressure, sinus pain and sore throat. Negative for dental problem, drooling, ear discharge, ear pain, facial swelling, hearing loss, mouth sores, nosebleeds, postnasal drip, rhinorrhea, sneezing, tinnitus, trouble swallowing and voice change.   Respiratory:  Positive for cough. Negative for apnea, choking, chest tightness, shortness of breath, wheezing and stridor.      Physical Exam Triage Vital Signs  ED Triage Vitals  Encounter Vitals Group     BP 05/18/24 1053 (!) 152/79     Girls Systolic BP Percentile --      Girls Diastolic BP Percentile --      Boys Systolic BP Percentile --      Boys Diastolic BP Percentile --      Pulse Rate 05/18/24 1053 76     Resp 05/18/24 1053 20     Temp 05/18/24 1053 98.7 F (37.1 C)     Temp Source 05/18/24 1053 Oral     SpO2 05/18/24 1053 96 %     Weight --      Height --      Head Circumference --      Peak Flow --      Pain Score 05/18/24 1055 0     Pain Loc --      Pain Education --      Exclude from Growth Chart --    No data found.  Updated Vital Signs BP (!) 152/79 (BP Location: Left Arm)   Pulse 76   Temp 98.7 F (37.1 C) (Oral)   Resp 20   SpO2 96%   Visual Acuity Right Eye Distance:   Left Eye Distance:   Bilateral Distance:    Right Eye Near:   Left Eye Near:    Bilateral Near:     Physical Exam Constitutional:      Appearance: Normal appearance.  HENT:     Head: Normocephalic.     Right Ear: Tympanic membrane, ear canal and external  ear normal.     Left Ear: Tympanic membrane, ear canal and external ear normal.     Nose: Congestion present.     Mouth/Throat:     Pharynx: No oropharyngeal exudate or posterior oropharyngeal erythema.  Eyes:     Extraocular Movements: Extraocular movements intact.  Cardiovascular:     Rate and Rhythm: Normal rate and regular rhythm.     Pulses: Normal pulses.     Heart sounds: Normal heart sounds.  Pulmonary:     Effort: Pulmonary effort is normal.     Breath sounds: Normal breath sounds.  Neurological:     Mental Status: He is alert and oriented to person, place, and time. Mental status is at baseline.      UC Treatments / Results  Labs (all labs ordered are listed, but only abnormal results are displayed) Labs Reviewed - No data to display  EKG   Radiology No results found.  Procedures Procedures (including critical care time)  Medications Ordered in UC Medications - No data to display  Initial Impression / Assessment and Plan / UC Course  I have reviewed the triage vital signs and the nursing notes.  Pertinent labs & imaging results that were available during my care of the patient were reviewed by me and considered in my medical decision making (see chart for details).  Acute nonrecurrent maxillary sinusitis  Patient is in no signs of distress nor toxic appearing.  Vital signs are stable.  Low suspicion for pneumonia, pneumothorax or bronchitis and therefore will defer imaging.  Symptomology consistent with a sinusitis presentation today is without signs of resolution and empirically placed on azithromycin and additionally prescribed prednisone  and guaifenesin codeine, PDMP reviewed, low risk.May use additional over-the-counter medications as needed for supportive care.  May follow-up with urgent care as needed if symptoms persist or worsen.  Final Clinical Impressions(s) / UC Diagnoses   Final diagnoses:  None  Discharge Instructions   None    ED  Prescriptions   None    PDMP not reviewed this encounter.   Teresa Shelba SAUNDERS, NP 05/18/24 [phone]

## 2024-05-18 NOTE — ED Triage Notes (Signed)
 Patient reports cough with yellow-brownish sputum, and nasal congestion x 7 days. Patient has daytime, nighttime and Robitussin DM with mild relief.

## 2024-06-15 ENCOUNTER — Telehealth: Payer: Self-pay

## 2024-06-15 ENCOUNTER — Other Ambulatory Visit: Payer: Self-pay

## 2024-06-15 DIAGNOSIS — Z800 Family history of malignant neoplasm of digestive organs: Secondary | ICD-10-CM

## 2024-06-15 DIAGNOSIS — Z860100 Personal history of colon polyps, unspecified: Secondary | ICD-10-CM

## 2024-06-15 MED ORDER — NA SULFATE-K SULFATE-MG SULF 17.5-3.13-1.6 GM/177ML PO SOLN: 1.0000 | Freq: Once | ORAL | 0 refills | Status: AC

## 2024-06-15 NOTE — Telephone Encounter (Signed)
 [facility] to [facility].

## 2024-06-15 NOTE — Telephone Encounter (Signed)
 Gastroenterology Pre-Procedure Review  Request Date: 10/19/24 Requesting Physician: Dr. Jinny  PATIENT REVIEW QUESTIONS: The patient responded to the following health history questions as indicated:    1. Are you having any GI issues? no 2. Do you have a personal history of Polyps? yes (last colonoscopy performed by Dr. Jinny 03/2019 recommended repeat in 5 years) 3. Do you have a family history of Colon Cancer or Polyps? Yes father colon cancer 4. Diabetes Mellitus? no 5. Joint replacements in the past 12 months?no 6. Major health problems in the past 3 months?no 7. Any artificial heart valves, MVP, or defibrillator?no 8. Cardiac history? Clearance sent to heart care    MEDICATIONS & ALLERGIES:    Patient reports the following regarding taking any anticoagulation/antiplatelet therapy:   Plavix , Coumadin, Eliquis, Xarelto, Lovenox, Pradaxa, Brilinta, or Effient? no Aspirin ? yes (81 mg daily)  Patient confirms/reports the following medications:  Current Outpatient Medications  Medication Sig Dispense Refill   aspirin  EC 81 MG tablet Take 81 mg by mouth daily. Swallow whole.     azithromycin  (ZITHROMAX ) 250 MG tablet Take 1 tablet (250 mg total) by mouth daily. Take first 2 tablets together, then 1 every day until finished. 6 tablet 0   celecoxib  (CELEBREX ) 100 MG capsule Take 1 capsule (100 mg total) by mouth 2 (two) times daily. 180 capsule 0   ezetimibe (ZETIA) 10 MG tablet Take 10 mg by mouth daily.     finasteride  (PROSCAR ) 5 MG tablet 1 tablet every other day 90 tablet 3   guaiFENesin -codeine  100-10 MG/5ML syrup Take 5 mLs by mouth every 6 (six) hours as needed for cough. 120 mL 0   ketoconazole  (NIZORAL ) 2 % cream Apply to face QD. 60 g 5   losartan  (COZAAR ) 100 MG tablet TAKE 1 TABLET BY MOUTH EVERY DAY 90 tablet 2   metoprolol  succinate (TOPROL -XL) 25 MG 24 hr tablet Take 25 mg by mouth every morning. Taking 2 tabs daily     Multiple Vitamin (MULTIVITAMIN WITH MINERALS) TABS  tablet Take 1 tablet by mouth daily.     predniSONE  (STERAPRED UNI-PAK 21 TAB) 10 MG (21) TBPK tablet Take by mouth daily. Take 6 tabs by mouth daily  for 1 days, then 5 tabs for 1 days, then 4 tabs for 1 days, then 3 tabs for 1 days, 2 tabs for 1 days, then 1 tab by mouth daily for 1 days 21 tablet 0   rosuvastatin  (CRESTOR ) 40 MG tablet Take 1 tablet (40 mg total) by mouth daily. (Patient taking differently: Take 40 mg by mouth at bedtime.) 90 tablet 3   traZODone  (DESYREL ) 50 MG tablet START TAKING 1/2 TABLET AT BEDTIME AND INCREASE TO 1 TABLET IF NEEDED 90 tablet 0   Zinc 50 MG TABS Take 50 mg by mouth daily.     No current facility-administered medications for this visit.    Patient confirms/reports the following allergies:  Allergies[1]  No orders of the defined types were placed in this encounter.   AUTHORIZATION INFORMATION Primary Insurance: 1D#: Group #:  Secondary Insurance: 1D#: Group #:  SCHEDULE INFORMATION: Date: 10/19/24 Time: Location: [facility]    [1]  Allergies Allergen Reactions   Penicillins Hives

## 2024-06-25 NOTE — Telephone Encounter (Signed)
 Cardiac clearance sent to [facility] in error.  [facility] said patient is following [facility] Dr. Ammon.   New clearance sent to Dr. Ammon.  Thanks,  Rosaline CMA

## 2024-06-30 ENCOUNTER — Ambulatory Visit (INDEPENDENT_AMBULATORY_CARE_PROVIDER_SITE_OTHER): Admitting: Physician Assistant

## 2024-06-30 VITALS — BP 150/78 | HR 87 | Temp 98.4°F | Ht 67.0 in | Wt 178.0 lb

## 2024-06-30 DIAGNOSIS — Z1211 Encounter for screening for malignant neoplasm of colon: Secondary | ICD-10-CM | POA: Diagnosis not present

## 2024-06-30 DIAGNOSIS — I2510 Atherosclerotic heart disease of native coronary artery without angina pectoris: Secondary | ICD-10-CM | POA: Diagnosis not present

## 2024-06-30 DIAGNOSIS — I10 Essential (primary) hypertension: Secondary | ICD-10-CM

## 2024-06-30 DIAGNOSIS — E785 Hyperlipidemia, unspecified: Secondary | ICD-10-CM | POA: Diagnosis not present

## 2024-06-30 DIAGNOSIS — R7303 Prediabetes: Secondary | ICD-10-CM | POA: Diagnosis not present

## 2024-06-30 DIAGNOSIS — R600 Localized edema: Secondary | ICD-10-CM | POA: Diagnosis not present

## 2024-06-30 DIAGNOSIS — R7689 Other specified abnormal immunological findings in serum: Secondary | ICD-10-CM

## 2024-06-30 NOTE — Progress Notes (Signed)
 " Established patient visit  Patient: Brett Wilson   DOB: Apr 26, 1956   69 y.o. Male  MRN: [id number] Visit Date: 06/30/2024  Today's healthcare provider: Jolynn Spencer, [name]   Chief Complaint  Patient presents with   Edema    Patient presents with complaint of pedal edema.  States he has had it on the left side for several months and about 1 month ago it started on both sides.  He reports it goes up about mid calf.  He has had no SOB or chest pain and has seen his cardiologist in the last 2 months with Echo included. He reports some pain with the sole of his feet experiencing the most.  Swelling is usually worse at night.  He reports that the edema is pitting for about 1 month   Subjective     HPI     Edema    Additional comments: Patient presents with complaint of pedal edema.  States he has had it on the left side for several months and about 1 month ago it started on both sides.  He reports it goes up about mid calf.  He has had no SOB or chest pain and has seen his cardiologist in the last 2 months with Echo included. He reports some pain with the sole of his feet experiencing the most.  Swelling is usually worse at night.  He reports that the edema is pitting for about 1 month      Last edited by Kathi Buel BIRCH, CMA on 06/30/2024  1:01 PM.           06/30/2024    1:02 PM 03/29/2024    8:15 AM 11/06/2023    1:31 PM  Depression screen PHQ 2/9  Decreased Interest 0 0 0  Down, Depressed, Hopeless 0 0 0  PHQ - 2 Score 0 0 0  Altered sleeping 0  0  Tired, decreased energy 0  1  Change in appetite 0  0  Feeling bad or failure about yourself  0  0  Trouble concentrating 0  0  Moving slowly or fidgety/restless 0  0  Suicidal thoughts 0  0  PHQ-9 Score 0  1   Difficult doing work/chores Not difficult at all  Not difficult at all     Data saved with a previous flowsheet row definition      06/30/2024    1:02 PM 11/06/2023    1:31 PM 09/19/2023    9:58 AM 02/24/2023    3:30 PM   GAD 7 : Generalized Anxiety Score  Nervous, Anxious, on Edge 0 0 0 0  Control/stop worrying 0 0 0 0  Worry too much - different things 0 0 0 0  Trouble relaxing 0 0 0 0  Restless 0 0 0 0  Easily annoyed or irritable 0 0 0 0  Afraid - awful might happen 0 0 0 0  Total GAD 7 Score 0 0 0 0  Anxiety Difficulty Not difficult at all Not difficult at all Not difficult at all Not difficult at all    Medications: Show/hide medication list[1]  Review of Systems All negative Except see HPI       Objective    BP (!) 150/78   Pulse 87   Temp 98.4 F (36.9 C) (Oral)   Ht 5' 7 (1.702 m)   Wt 178 lb (80.7 kg)   SpO2 98%   BMI 27.88 kg/m     Physical Exam Vitals reviewed.  Constitutional:  General: He is not in acute distress.    Appearance: Normal appearance. He is not diaphoretic.  HENT:     Head: Normocephalic and atraumatic.  Eyes:     General: No scleral icterus.    Conjunctiva/sclera: Conjunctivae normal.  Cardiovascular:     Rate and Rhythm: Normal rate and regular rhythm.     Pulses: Normal pulses.     Heart sounds: Normal heart sounds. No murmur heard. Pulmonary:     Effort: Pulmonary effort is normal. No respiratory distress.     Breath sounds: Normal breath sounds. No wheezing or rhonchi.  Musculoskeletal:     Cervical back: Neck supple.     Right lower leg: Edema present.     Left lower leg: Edema present.  Lymphadenopathy:     Cervical: No cervical adenopathy.  Skin:    General: Skin is warm and dry.     Findings: No rash.  Neurological:     Mental Status: He is alert and oriented to person, place, and time. Mental status is at baseline.  Psychiatric:        Mood and Affect: Mood normal.        Behavior: Behavior normal.      No results found for any visits on 06/30/24.      Assessment & Plan   1. White coat syndrome with diagnosis of hypertension (Primary) High BP readings - OV Normal BP readings - home Advised to measure bp at home,  bring his bp log and device to the next appointment Continue losartan  100 and metoprolol  25 Workup ordered - Comprehensive metabolic panel with GFR Will reassess after  receiving lab results Will FU  Coronary artery disease involving native coronary artery of native heart without angina pectoris Chronic and stable Continue asa and cholesterol med - Comprehensive metabolic panel with GFR Managed by cardiology  Prediabetes Chronic Update: - Comprehensive metabolic panel with GFR - Hemoglobin A1c - Lipid Panel With LDL/HDL Ratio Advised low carb diet and regular exercise Will reassess after  receiving lab results Will FU  Hyperlipidemia, unspecified hyperlipidemia type Chronic and stab le Continue taking rosuvastatin  40mg  daily Continue low cholesterol diet and exercise ordered - Comprehensive metabolic panel with GFR - Hemoglobin A1c - Lipid Panel With LDL/HDL Ratio Will reassess after  receiving lab results Will FU  Colon cancer screening 2 sessule polyps were found in 2020, non-bleeding internal hemorrhoids. Was advised to repeat in 5 years Will place a referral if pt agrees  Pedal edema Chronic and bilateral Pitting Consider taking hydrochlorothiazide  if BP will stay higher than 130/80 Advised compression stockings and raise his legs  Pt requested a referral to vascular specialist Will follow-up  Orders Placed This Encounter  Procedures   Comprehensive metabolic panel with GFR   Hemoglobin A1c   Lipid Panel With LDL/HDL Ratio    No follow-ups on file.   The patient was advised to call back or seek an in-person evaluation if the symptoms worsen or if the condition fails to improve as anticipated.  I discussed the assessment and treatment plan with the patient. The patient was provided an opportunity to ask questions and all were answered. The patient agreed with the plan and demonstrated an understanding of the instructions.  I, Dimarco Minkin, [name] have  reviewed all documentation for this visit. The documentation on 06/30/2024  for the exam, diagnosis, procedures, and orders are all accurate and complete.  Jolynn Spencer, [facility], MMS [facility] [phone] (phone) [phone] (fax)  [facility]     [1]  Outpatient Medications Prior to Visit  Medication Sig   aspirin  EC 81 MG tablet Take 81 mg by mouth daily. Swallow whole.   celecoxib  (CELEBREX ) 100 MG capsule Take 1 capsule (100 mg total) by mouth 2 (two) times daily.   ezetimibe (ZETIA) 10 MG tablet Take 10 mg by mouth daily.   finasteride  (PROSCAR ) 5 MG tablet 1 tablet every other day   losartan  (COZAAR ) 100 MG tablet TAKE 1 TABLET BY MOUTH EVERY DAY   metoprolol  succinate (TOPROL -XL) 25 MG 24 hr tablet Take 25 mg by mouth every morning. Taking 2 tabs daily   Multiple Vitamin (MULTIVITAMIN WITH MINERALS) TABS tablet Take 1 tablet by mouth daily.   rosuvastatin  (CRESTOR ) 40 MG tablet Take 1 tablet (40 mg total) by mouth daily.   traZODone  (DESYREL ) 50 MG tablet START TAKING 1/2 TABLET AT BEDTIME AND INCREASE TO 1 TABLET IF NEEDED   Zinc 50 MG TABS Take 50 mg by mouth daily.   azithromycin  (ZITHROMAX ) 250 MG tablet Take 1 tablet (250 mg total) by mouth daily. Take first 2 tablets together, then 1 every day until finished.   guaiFENesin -codeine  100-10 MG/5ML syrup Take 5 mLs by mouth every 6 (six) hours as needed for cough.   ketoconazole  (NIZORAL ) 2 % cream Apply to face QD.   predniSONE  (STERAPRED UNI-PAK 21 TAB) 10 MG (21) TBPK tablet Take by mouth daily. Take 6 tabs by mouth daily  for 1 days, then 5 tabs for 1 days, then 4 tabs for 1 days, then 3 tabs for 1 days, 2 tabs for 1 days, then 1 tab by mouth daily for 1 days   No facility-administered medications prior to visit.   "

## 2024-07-02 LAB — COMPREHENSIVE METABOLIC PANEL WITH GFR
ALT: 28 IU/L (ref 0–44)
AST: 28 IU/L (ref 0–40)
Albumin: 4.3 g/dL (ref 3.9–4.9)
Alkaline Phosphatase: 95 IU/L (ref 47–123)
BUN/Creatinine Ratio: 16 (ref 10–24)
BUN: 16 mg/dL (ref 8–27)
Bilirubin Total: 1 mg/dL (ref 0.0–1.2)
CO2: 25 mmol/L (ref 20–29)
Calcium: 9.7 mg/dL (ref 8.6–10.2)
Chloride: 102 mmol/L (ref 96–106)
Creatinine, Ser: 0.97 mg/dL (ref 0.76–1.27)
Globulin, Total: 2.3 g/dL (ref 1.5–4.5)
Glucose: 89 mg/dL (ref 70–99)
Potassium: 4.5 mmol/L (ref 3.5–5.2)
Sodium: 141 mmol/L (ref 134–144)
Total Protein: 6.6 g/dL (ref 6.0–8.5)
eGFR: 85 mL/min/1.73

## 2024-07-02 LAB — HEMOGLOBIN A1C
Est. average glucose Bld gHb Est-mCnc: 117 mg/dL
Hgb A1c MFr Bld: 5.7 % — ABNORMAL HIGH (ref 4.8–5.6)

## 2024-07-02 LAB — LIPID PANEL WITH LDL/HDL RATIO
Cholesterol, Total: 121 mg/dL (ref 100–199)
HDL: 40 mg/dL
LDL Chol Calc (NIH): 66 mg/dL (ref 0–99)
LDL/HDL Ratio: 1.7 ratio (ref 0.0–3.6)
Triglycerides: 75 mg/dL (ref 0–149)
VLDL Cholesterol Cal: 15 mg/dL (ref 5–40)

## 2024-07-02 NOTE — Telephone Encounter (Signed)
 Cardiac clearance has been granted by Dr. Ammon for patients colonoscopy scheduled 10/19/24.  Thanks,  Rosaline CMA

## 2024-07-02 NOTE — Progress Notes (Incomplete)
 " Established patient visit  Patient: Brett Wilson   DOB: 27-Dec-1955   69 y.o. Male  MRN: [id number] Visit Date: 06/30/2024  Today's healthcare provider: Jolynn Spencer, [name]   Chief Complaint  Patient presents with   Edema    Patient presents with complaint of pedal edema.  States he has had it on the left side for several months and about 1 month ago it started on both sides.  He reports it goes up about mid calf.  He has had no SOB or chest pain and has seen his cardiologist in the last 2 months with Echo included. He reports some pain with the sole of his feet experiencing the most.  Swelling is usually worse at night.  He reports that the edema is pitting for about 1 month   Subjective     HPI     Edema    Additional comments: Patient presents with complaint of pedal edema.  States he has had it on the left side for several months and about 1 month ago it started on both sides.  He reports it goes up about mid calf.  He has had no SOB or chest pain and has seen his cardiologist in the last 2 months with Echo included. He reports some pain with the sole of his feet experiencing the most.  Swelling is usually worse at night.  He reports that the edema is pitting for about 1 month      Last edited by Kathi Buel BIRCH, CMA on 06/30/2024  1:01 PM.           06/30/2024    1:02 PM 03/29/2024    8:15 AM 11/06/2023    1:31 PM  Depression screen PHQ 2/9  Decreased Interest 0 0 0  Down, Depressed, Hopeless 0 0 0  PHQ - 2 Score 0 0 0  Altered sleeping 0  0  Tired, decreased energy 0  1  Change in appetite 0  0  Feeling bad or failure about yourself  0  0  Trouble concentrating 0  0  Moving slowly or fidgety/restless 0  0  Suicidal thoughts 0  0  PHQ-9 Score 0  1   Difficult doing work/chores Not difficult at all  Not difficult at all     Data saved with a previous flowsheet row definition      06/30/2024    1:02 PM 11/06/2023    1:31 PM 09/19/2023    9:58 AM 02/24/2023    3:30 PM   GAD 7 : Generalized Anxiety Score  Nervous, Anxious, on Edge 0 0 0 0  Control/stop worrying 0 0 0 0  Worry too much - different things 0 0 0 0  Trouble relaxing 0 0 0 0  Restless 0 0 0 0  Easily annoyed or irritable 0 0 0 0  Afraid - awful might happen 0 0 0 0  Total GAD 7 Score 0 0 0 0  Anxiety Difficulty Not difficult at all Not difficult at all Not difficult at all Not difficult at all    Medications: Show/hide medication list[1]  Review of Systems All negative Except see HPI   {Insert previous labs (optional):23779} {See past labs  Heme  Chem  Endocrine  Serology  Results Review (optional):1}   Objective    BP (!) 150/78   Pulse 87   Temp 98.4 F (36.9 C) (Oral)   Ht 5' 7 (1.702 m)   Wt 178 lb (80.7 kg)  SpO2 98%   BMI 27.88 kg/m  {Insert last BP/Wt (optional):23777}{See vitals history (optional):1}   Physical Exam Vitals reviewed.  Constitutional:      General: He is not in acute distress.    Appearance: Normal appearance. He is not diaphoretic.  HENT:     Head: Normocephalic and atraumatic.  Eyes:     General: No scleral icterus.    Conjunctiva/sclera: Conjunctivae normal.  Cardiovascular:     Rate and Rhythm: Normal rate and regular rhythm.     Pulses: Normal pulses.     Heart sounds: Normal heart sounds. No murmur heard. Pulmonary:     Effort: Pulmonary effort is normal. No respiratory distress.     Breath sounds: Normal breath sounds. No wheezing or rhonchi.  Musculoskeletal:     Cervical back: Neck supple.     Right lower leg: Edema present.     Left lower leg: Edema present.  Lymphadenopathy:     Cervical: No cervical adenopathy.  Skin:    General: Skin is warm and dry.     Findings: No rash.  Neurological:     Mental Status: He is alert and oriented to person, place, and time. Mental status is at baseline.  Psychiatric:        Mood and Affect: Mood normal.        Behavior: Behavior normal.      No results found for any visits  on 06/30/24.      Assessment & Plan   1. White coat syndrome with diagnosis of hypertension (Primary) High BP readings - OV Normal BP readings - home Advised to measure bp at home, bring his bp log and device to the next appointment Continue losartan  100 and metoprolol  25 Workup ordered - Comprehensive metabolic panel with GFR Will reassess after  receiving lab results Will FU  Coronary artery disease involving native coronary artery of native heart without angina pectoris Chronic and stable Continue asa and cholesterol med - Comprehensive metabolic panel with GFR Managed by cardiology  Prediabetes Chronic Update: - Comprehensive metabolic panel with GFR - Hemoglobin A1c - Lipid Panel With LDL/HDL Ratio Advised low carb diet and regular exercise Will reassess after  receiving lab results Will FU  Hyperlipidemia, unspecified hyperlipidemia type Chronic and stab le Continue taking rosuvastatin  40mg  daily Continue low cholesterol diet and exercise ordered - Comprehensive metabolic panel with GFR - Hemoglobin A1c - Lipid Panel With LDL/HDL Ratio Will reassess after  receiving lab results Will FU  5. 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 Was advised to repeat in 5 years Will place a referral if pt agrees  Pedal edema Chronic and bilateral Pitting Consider taking hydrochlorothiazide  if BP will stay higher than 130/80 Advised compression stockings and raise his legs  Pt requested a referral to vascular specialist Will follow-up  Orders Placed This Encounter  Procedures   Comprehensive metabolic panel with GFR   Hemoglobin A1c   Lipid Panel With LDL/HDL Ratio    No follow-ups on file.   The patient was advised to call back or seek an in-person evaluation if the symptoms worsen or if the condition fails to improve as  anticipated.  I discussed the assessment and treatment plan with the patient. The patient was provided an opportunity to ask questions and all were answered. The patient agreed with the plan and demonstrated an understanding of the instructions.  I, Amareon Phung, [name] have reviewed all documentation for this visit. The documentation on 06/30/2024  for the exam, diagnosis, procedures, and orders are all accurate and complete.  Jolynn Spencer, [facility], MMS [facility] [phone] (phone) [phone] (fax)  [facility]       [1] Outpatient Medications Prior to Visit  Medication Sig   aspirin  EC 81 MG tablet Take 81 mg by mouth daily. Swallow whole.   celecoxib  (CELEBREX ) 100 MG capsule Take 1 capsule (100 mg total) by mouth 2 (two) times daily.   ezetimibe (ZETIA) 10 MG tablet Take 10 mg by mouth daily.   finasteride  (PROSCAR ) 5 MG tablet 1 tablet every other day   losartan  (COZAAR ) 100 MG tablet TAKE 1 TABLET BY MOUTH EVERY DAY   metoprolol  succinate (TOPROL -XL) 25 MG 24 hr tablet Take 25 mg by mouth every morning. Taking 2 tabs daily   Multiple Vitamin (MULTIVITAMIN WITH MINERALS) TABS tablet Take 1 tablet by mouth daily.   rosuvastatin  (CRESTOR ) 40 MG tablet Take 1 tablet (40 mg total) by mouth daily.   traZODone  (DESYREL ) 50 MG tablet START TAKING 1/2 TABLET AT BEDTIME AND INCREASE TO 1 TABLET IF NEEDED   Zinc 50 MG TABS Take 50 mg by mouth daily.   azithromycin  (ZITHROMAX ) 250 MG tablet Take 1 tablet (250 mg total) by mouth daily. Take first 2 tablets together, then 1 every day until finished.   guaiFENesin -codeine  100-10 MG/5ML syrup Take 5 mLs by mouth every 6 (six) hours as needed for cough.   ketoconazole  (NIZORAL ) 2 % cream Apply to face QD.   predniSONE  (STERAPRED UNI-PAK 21 TAB) 10 MG (21) TBPK tablet Take by mouth daily. Take 6 tabs by mouth daily  for 1 days, then 5 tabs for 1 days, then 4 tabs for 1 days, then 3 tabs for 1 days, 2  tabs for 1 days, then 1 tab by mouth daily for 1 days   No facility-administered medications prior to visit.  "

## 2024-07-03 ENCOUNTER — Encounter: Payer: Self-pay | Admitting: Physician Assistant

## 2024-07-05 ENCOUNTER — Ambulatory Visit: Payer: Self-pay | Admitting: Physician Assistant

## 2024-07-13 ENCOUNTER — Ambulatory Visit (INDEPENDENT_AMBULATORY_CARE_PROVIDER_SITE_OTHER)

## 2024-07-13 ENCOUNTER — Ambulatory Visit: Admitting: Podiatry

## 2024-07-13 DIAGNOSIS — M2142 Flat foot [pes planus] (acquired), left foot: Secondary | ICD-10-CM

## 2024-07-13 DIAGNOSIS — M7752 Other enthesopathy of left foot: Secondary | ICD-10-CM

## 2024-07-13 DIAGNOSIS — M7751 Other enthesopathy of right foot: Secondary | ICD-10-CM

## 2024-07-13 DIAGNOSIS — M2141 Flat foot [pes planus] (acquired), right foot: Secondary | ICD-10-CM

## 2024-07-13 NOTE — Progress Notes (Signed)
 "  Chief Complaint  Patient presents with   Foot Pain    Pt is here due to bilateral foot pain and swelling, he states this has been going on for about 6 months, states no injuries, pain is in the soles of the feet, states been treating the swelling with elevating the feet, wearing compressions socks and decrease of salt intake.    HPI: 69 y.o. malepresenting for gradual onset of bilateral lower extremity edema as well as some pain and tenderness associated to the plantar aspect of the bilateral feet.  Idiopathic gradual onset over the last 3 months.  No history of injury  Past Medical History:  Diagnosis Date   Allergy    Arthritis 03/1999   BPH (benign prostatic hyperplasia)    Coronary artery disease 12/19/2021   a.) cCTA 12/19/2021: Ca score 220 (66th percentile for age/sex match control); >70% pLAD territory (FFR 0.69); b.) LHC/PCI 12/26/2021: 40% p-mRCA, 90% p-mLAD (2.75 x 26 mm Onxy Frontier DES x 1)   Diastolic dysfunction 09/10/2021   a.) stress TTE 09/10/2021: EF> 55%, triv-mild MR/TR/PR, G1DD.   Dysplastic nevus 01/17/2020   Left mid side. Severe atypia with scar, close to margin. exc 03/14/2020   Dysplastic nevus 01/17/2020   Left calf. Moderate atypia, deep margin involved.    Dysplastic nevus 01/17/2020   LUQ abdomen. Mild atypia, limited margins free.    Dysplastic nevus 02/26/2024   R ant shoulder - moderate   Hepatitis C antibody test positive    Hyperlipidemia    Hypertension    Liver nodule    Long term current use of antithrombotics/antiplatelets    a.) on DAPT (ASA + clopidogrel )   Lung nodule    Nontraumatic rotator cuff tear, right    Pituitary adenoma (HCC)    Pre-diabetes    Thyroid  nodule    Typical angina     Past Surgical History:  Procedure Laterality Date   CATARACT EXTRACTION Bilateral    COLONOSCOPY WITH PROPOFOL  N/A 04/13/2019   Procedure: COLONOSCOPY WITH PROPOFOL ;  Surgeon: Jinny Carmine, MD;  Location: [facility] ENDOSCOPY;  Service: Endoscopy;   Laterality: N/A;   CORONARY STENT INTERVENTION N/A 12/26/2021   Procedure: CORONARY STENT INTERVENTION;  Surgeon: Lawyer Bernardino Cough, MD;  Location: [facility] INVASIVE CV LAB;  Service: Cardiovascular;  Laterality: N/A;   CORONARY ULTRASOUND/IVUS N/A 12/26/2021   Procedure: Intravascular Ultrasound/IVUS;  Surgeon: Lawyer Bernardino Cough, MD;  Location: [facility] INVASIVE CV LAB;  Service: Cardiovascular;  Laterality: N/A;   CYSTOSCOPY WITH INSERTION OF UROLIFT     CYSTOSCOPY WITH INSERTION OF UROLIFT N/A 11/29/2019   Procedure: CYSTOSCOPY WITH INSERTION OF UROLIFT;  Surgeon: Penne Knee, MD;  Location: [facility] ORS;  Service: Urology;  Laterality: N/A;   EYE SURGERY  Cataract 2005   HOLEP-LASER ENUCLEATION OF THE PROSTATE WITH MORCELLATION N/A 11/29/2019   Procedure: HOLEP-LASER ENUCLEATION OF THE PROSTATE;  Surgeon: Penne Knee, MD;  Location: [facility] ORS;  Service: Urology;  Laterality: N/A;   KNEE ARTHROSCOPY  2008   LEFT HEART CATH AND CORONARY ANGIOGRAPHY N/A 12/26/2021   Procedure: LEFT HEART CATH AND CORONARY ANGIOGRAPHY;  Surgeon: Lawyer Bernardino Cough, MD;  Location: [facility] INVASIVE CV LAB;  Service: Cardiovascular;  Laterality: N/A;   PITUITARY EXCISION     SHOULDER ARTHROSCOPY Right 07/22/2022   Procedure: ARTHROSCOPY SHOULDER;  Surgeon: Rollene Cough, MD;  Location: [facility] ORS;  Service: Orthopedics;  Laterality: Right;  SUBACROMIAL DECOMPRESSION, OPEN BICEPS TENODESIS   TONSILLECTOMY      Allergies[1]   Physical  Exam: General: The patient is alert and oriented x3 in no acute distress.  Dermatology: Skin is warm, dry and supple bilateral lower extremities.   Vascular: Palpable pedal pulses bilaterally. Capillary refill within normal limits.  No erythema.  Generalized moderate pitting edema noted bilateral lower extremities.  Neurological: Grossly intact via light touch  Musculoskeletal Exam: Pes planovalgus deformity noted with weightbearing.  Collapse of medial longitudinal arch of  the foot left with associated tenderness with palpation around the sinus tarsi and ankle joint  Radiographic Exam B/L feet 07/13/2024:  Normal osseous mineralization. Joint spaces preserved.  No fractures or irregularities noted.  There is some medial deviation of the talar head left foot consistent with pes planovalgus deformity and collapse of the medial longitudinal arch of the foot on x-ray.  No para-articular spurring or degenerative changes noted  Assessment/Plan of Care: 1.  Pes planus bilateral 2.  Idiopathic lower extremity edema bilateral  -Patient evaluated.  X-rays reviewed -Patient has an upcoming appointment with vascular to rule out any vascular compromise.  Clinically I do not see any vascular concerns. -Recommend good supportive arch supports.  We did discuss custom orthotics but for now we will try prefabricated power step insoles to see if this will help alleviate the medial longitudinal arch of the foot -Continue wearing good supportive tennis shoes and sneakers -Return to clinic PRN     Thresa EMERSON Sar, DPM [facility]  Dr. Thresa EMERSON Sar, DPM    [address] [place], [place] [address]                Office [phone]  Fax [phone]        [1]  Allergies Allergen Reactions   Penicillins Hives   "

## 2024-07-15 ENCOUNTER — Encounter (INDEPENDENT_AMBULATORY_CARE_PROVIDER_SITE_OTHER): Admitting: Nurse Practitioner

## 2024-07-16 DIAGNOSIS — I890 Lymphedema, not elsewhere classified: Secondary | ICD-10-CM | POA: Insufficient documentation

## 2024-07-16 DIAGNOSIS — I10 Essential (primary) hypertension: Secondary | ICD-10-CM | POA: Insufficient documentation

## 2024-07-16 NOTE — Progress Notes (Unsigned)
 "                              MRN : [id number]  Brett Wilson is a 69 y.o. ([birth year]) male who presents with chief complaint of legs swell.  History of Present Illness:   Patient is seen for evaluation of leg swelling. The patient first noticed the swelling remotely but is now concerned because of a significant increase in the overall edema. The swelling isn't associated with significant pain.  There has been an increasing amount of  discoloration noted by the patient. The patient notes that in the morning the legs are improved but they steadily worsened throughout the course of the day. Elevation seems to make the swelling of the legs better, dependency makes them much worse.   There is no history of ulcerations associated with the swelling.   The patient denies any recent changes in their medications.  The patient has not been wearing graduated compression.  The patient has no had any past angiography, interventions or vascular surgery.  The patient denies a history of DVT or PE. There is no prior history of phlebitis. There is no history of primary lymphedema.  There is no history of radiation treatment to the groin or pelvis No history of malignancies. No history of trauma or groin or pelvic surgery. No history of foreign travel or parasitic infections area   Active Medications[1]  Past Medical History:  Diagnosis Date   Allergy    Arthritis 03/1999   BPH (benign prostatic hyperplasia)    Coronary artery disease 12/19/2021   a.) cCTA 12/19/2021: Ca score 220 (66th percentile for age/sex match control); >70% pLAD territory (FFR 0.69); b.) LHC/PCI 12/26/2021: 40% p-mRCA, 90% p-mLAD (2.75 x 26 mm Onxy Frontier DES x 1)   Diastolic dysfunction 09/10/2021   a.) stress TTE 09/10/2021: EF> 55%, triv-mild MR/TR/PR, G1DD.   Dysplastic nevus 01/17/2020   Left mid side. Severe atypia with scar, close to margin. exc 03/14/2020   Dysplastic nevus 01/17/2020   Left calf. Moderate  atypia, deep margin involved.    Dysplastic nevus 01/17/2020   LUQ abdomen. Mild atypia, limited margins free.    Dysplastic nevus 02/26/2024   R ant shoulder - moderate   Hepatitis C antibody test positive    Hyperlipidemia    Hypertension    Liver nodule    Long term current use of antithrombotics/antiplatelets    a.) on DAPT (ASA + clopidogrel )   Lung nodule    Nontraumatic rotator cuff tear, right    Pituitary adenoma (HCC)    Pre-diabetes    Thyroid  nodule    Typical angina     Past Surgical History:  Procedure Laterality Date   CATARACT EXTRACTION Bilateral    COLONOSCOPY WITH PROPOFOL  N/A 04/13/2019   Procedure: COLONOSCOPY WITH PROPOFOL ;  Surgeon: Jinny Carmine, MD;  Location: [facility] ENDOSCOPY;  Service: Endoscopy;  Laterality: N/A;   CORONARY STENT INTERVENTION N/A 12/26/2021   Procedure: CORONARY STENT INTERVENTION;  Surgeon: Lawyer Bernardino Cough, MD;  Location: [facility] INVASIVE CV LAB;  Service: Cardiovascular;  Laterality: N/A;   CORONARY ULTRASOUND/IVUS N/A 12/26/2021   Procedure: Intravascular Ultrasound/IVUS;  Surgeon: Lawyer Bernardino Cough, MD;  Location[facility]VASIVE CV LAB;  Service: Cardiovascular;  Laterality: N/A;   CYSTOSCOPY WITH INSERTION OF UROLIFT     CYSTOSCOPY WITH INSERTION OF UROLIFT N/A 11/29/2019   Procedure: CYSTOSCOPY WITH INSERTION OF UROLIFT;  Surgeon: Penne Knee, MD;  Location: [facility]  ORS;  Service: Urology;  Laterality: N/A;   EYE SURGERY  Cataract 2005   HOLEP-LASER ENUCLEATION OF THE PROSTATE WITH MORCELLATION N/A 11/29/2019   Procedure: HOLEP-LASER ENUCLEATION OF THE PROSTATE;  Surgeon: Penne Knee, MD;  Location: [facility] ORS;  Service: Urology;  Laterality: N/A;   KNEE ARTHROSCOPY  2008   LEFT HEART CATH AND CORONARY ANGIOGRAPHY N/A 12/26/2021   Procedure: LEFT HEART CATH AND CORONARY ANGIOGRAPHY;  Surgeon: Lawyer Bernardino Cough, MD;  Location: [facility] INVASIVE CV LAB;  Service: Cardiovascular;  Laterality: N/A;   PITUITARY EXCISION     SHOULDER  ARTHROSCOPY Right 07/22/2022   Procedure: ARTHROSCOPY SHOULDER;  Surgeon: Rollene Cough, MD;  Location: [facility] ORS;  Service: Orthopedics;  Laterality: Right;  SUBACROMIAL DECOMPRESSION, OPEN BICEPS TENODESIS   TONSILLECTOMY      Social History Social History[2]  Family History Family History  Problem Relation Age of Onset   Hypertension Mother    Arthritis Mother    Colon cancer Father    Cancer Father     Allergies[3]   REVIEW OF SYSTEMS (Negative unless checked)  Constitutional: [] Weight loss  [] Fever  [] Chills Cardiac: [] Chest pain   [] Chest pressure   [] Palpitations   [] Shortness of breath when laying flat   [] Shortness of breath with exertion. Vascular:  [] Pain in legs with walking   [x] Pain in legs with standing  [] History of DVT   [] Phlebitis   [x] Swelling in legs   [] Varicose veins   [] Non-healing ulcers Pulmonary:   [] Uses home oxygen   [] Productive cough   [] Hemoptysis   [] Wheeze  [] COPD   [] Asthma Neurologic:  [] Dizziness   [] Seizures   [] History of stroke   [] History of TIA  [] Aphasia   [] Vissual changes   [] Weakness or numbness in arm   [] Weakness or numbness in leg Musculoskeletal:   [] Joint swelling   [] Joint pain   [] Low back pain Hematologic:  [] Easy bruising  [] Easy bleeding   [] Hypercoagulable state   [] Anemic Gastrointestinal:  [] Diarrhea   [] Vomiting  [] Gastroesophageal reflux/heartburn   [] Difficulty swallowing. Genitourinary:  [] Chronic kidney disease   [] Difficult urination  [] Frequent urination   [] Blood in urine Skin:  [] Rashes   [] Ulcers  Psychological:  [] History of anxiety   []  History of major depression.  Physical Examination  There were no vitals filed for this visit. There is no height or weight on file to calculate BMI. Gen: WD/WN, NAD Head: [place]men/AT, No temporalis wasting.  Ear/Nose/Throat: Hearing grossly intact, nares w/o erythema or drainage, pinna without lesions Eyes: PER, EOMI, sclera nonicteric.  Neck: Supple, no gross masses.  No  JVD.  Pulmonary:  Good air movement, no audible wheezing, no use of accessory muscles.  Cardiac: RRR, precordium not hyperdynamic. Vascular:  scattered varicosities present bilaterally.  Mild venous stasis changes to the legs bilaterally.  3-4+ soft pitting edema, CEAP C4sEpAsPr  Vessel Right Left  Radial Palpable Palpable  Gastrointestinal: soft, non-distended. No guarding/no peritoneal signs.  Musculoskeletal: M/S 5/5 throughout.  No deformity.  Neurologic: CN 2-12 intact. Pain and light touch intact in extremities.  Symmetrical.  Speech is fluent. Motor exam as listed above. Psychiatric: Judgment intact, Mood & affect appropriate for pt's clinical situation. Dermatologic: Venous rashes no ulcers noted.  No changes consistent with cellulitis. Lymph : No lichenification or skin changes of chronic lymphedema.  CBC Lab Results  Component Value Date   WBC 10.0 09/19/2023   HGB 14.7 09/19/2023   HCT 44.5 09/19/2023   MCV 97 09/19/2023   PLT 267  09/19/2023    BMET    Component Value Date/Time   NA 141 07/01/2024 0837   K 4.5 07/01/2024 0837   CL 102 07/01/2024 0837   CO2 25 07/01/2024 0837   GLUCOSE 89 07/01/2024 0837   GLUCOSE 95 07/10/2022 1033   BUN 16 07/01/2024 0837   CREATININE 0.97 07/01/2024 0837   CALCIUM  9.7 07/01/2024 0837   GFRNONAA >60 07/10/2022 1033   GFRAA >60 11/29/2019 2050   CrCl cannot be calculated (Unknown ideal weight.).  COAG No results found for: INR, PROTIME  Radiology DG Foot Complete Right Result Date: 07/13/2024 Please see detailed radiograph report in office note.  DG Foot Complete Left Result Date: 07/13/2024 Please see detailed radiograph report in office note.    Assessment/Plan There are no diagnoses linked to this encounter.   Cordella Shawl, MD  07/16/2024 10:48 AM      [1]  No outpatient medications have been marked as taking for the 07/19/24 encounter (Appointment) with Shawl, Cordella MATSU, MD.  [2]  Social  History Tobacco Use   Smoking status: Never   Smokeless tobacco: Never  Vaping Use   Vaping status: Never Used  Substance Use Topics   Alcohol use: Not Currently   Drug use: Never  [3]  Allergies Allergen Reactions   Penicillins Hives   "

## 2024-07-19 ENCOUNTER — Ambulatory Visit (INDEPENDENT_AMBULATORY_CARE_PROVIDER_SITE_OTHER): Admitting: Vascular Surgery

## 2024-07-19 ENCOUNTER — Encounter (INDEPENDENT_AMBULATORY_CARE_PROVIDER_SITE_OTHER): Payer: Self-pay | Admitting: Vascular Surgery

## 2024-07-19 VITALS — BP 165/81 | HR 79 | Resp 17 | Ht 67.0 in | Wt 181.0 lb

## 2024-07-19 DIAGNOSIS — M1990 Unspecified osteoarthritis, unspecified site: Secondary | ICD-10-CM

## 2024-07-19 DIAGNOSIS — E785 Hyperlipidemia, unspecified: Secondary | ICD-10-CM

## 2024-07-19 DIAGNOSIS — I10 Essential (primary) hypertension: Secondary | ICD-10-CM

## 2024-07-19 DIAGNOSIS — I2510 Atherosclerotic heart disease of native coronary artery without angina pectoris: Secondary | ICD-10-CM | POA: Diagnosis not present

## 2024-07-19 DIAGNOSIS — I890 Lymphedema, not elsewhere classified: Secondary | ICD-10-CM

## 2024-07-29 ENCOUNTER — Ambulatory Visit: Admitting: Physician Assistant

## 2024-08-05 ENCOUNTER — Encounter (INDEPENDENT_AMBULATORY_CARE_PROVIDER_SITE_OTHER)

## 2024-08-05 ENCOUNTER — Ambulatory Visit (INDEPENDENT_AMBULATORY_CARE_PROVIDER_SITE_OTHER): Admitting: Vascular Surgery

## 2024-10-19 ENCOUNTER — Ambulatory Visit: Admit: 2024-10-19 | Admitting: Gastroenterology

## 2024-10-19 SURGERY — COLONOSCOPY
Anesthesia: General

## 2024-11-09 ENCOUNTER — Encounter: Admitting: Physician Assistant

## 2024-11-16 ENCOUNTER — Encounter

## 2025-03-03 ENCOUNTER — Ambulatory Visit: Admitting: Dermatology
